# Patient Record
Sex: Female | Born: 1990
Health system: Southern US, Community
[De-identification: ages and names within clinical notes are randomized; demographics above are authoritative.]

## PROBLEM LIST (undated history)

## (undated) ENCOUNTER — Inpatient Hospital Stay: Payer: Self-pay

## (undated) DIAGNOSIS — T7840XA Allergy, unspecified, initial encounter: Secondary | ICD-10-CM

## (undated) DIAGNOSIS — D509 Iron deficiency anemia, unspecified: Secondary | ICD-10-CM

## (undated) DIAGNOSIS — D649 Anemia, unspecified: Secondary | ICD-10-CM

## (undated) DIAGNOSIS — J189 Pneumonia, unspecified organism: Secondary | ICD-10-CM

## (undated) HISTORY — DX: Anemia, unspecified: D64.9

## (undated) HISTORY — PX: ADENOIDECTOMY: SUR15

## (undated) HISTORY — DX: Iron deficiency anemia, unspecified: D50.9

## (undated) HISTORY — DX: Allergy, unspecified, initial encounter: T78.40XA

## (undated) HISTORY — PX: OTHER SURGICAL HISTORY: SHX169

## (undated) HISTORY — DX: Pneumonia, unspecified organism: J18.9

---
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
## 2011-07-17 DIAGNOSIS — R251 Tremor, unspecified: Secondary | ICD-10-CM | POA: Insufficient documentation

## 2011-07-17 DIAGNOSIS — F8081 Childhood onset fluency disorder: Secondary | ICD-10-CM | POA: Insufficient documentation

## 2011-08-27 ENCOUNTER — Encounter: Payer: Self-pay | Admitting: Family Medicine

## 2011-08-27 ENCOUNTER — Ambulatory Visit (INDEPENDENT_AMBULATORY_CARE_PROVIDER_SITE_OTHER): Payer: BC Managed Care – PPO | Admitting: Family Medicine

## 2011-08-27 VITALS — BP 110/80 | HR 92 | Temp 98.5°F | Ht 66.25 in | Wt 189.8 lb

## 2011-08-27 DIAGNOSIS — R251 Tremor, unspecified: Secondary | ICD-10-CM

## 2011-08-27 DIAGNOSIS — J45909 Unspecified asthma, uncomplicated: Secondary | ICD-10-CM

## 2011-08-27 DIAGNOSIS — T7840XA Allergy, unspecified, initial encounter: Secondary | ICD-10-CM | POA: Insufficient documentation

## 2011-08-27 DIAGNOSIS — J4521 Mild intermittent asthma with (acute) exacerbation: Secondary | ICD-10-CM | POA: Insufficient documentation

## 2011-08-27 DIAGNOSIS — R002 Palpitations: Secondary | ICD-10-CM | POA: Insufficient documentation

## 2011-08-27 DIAGNOSIS — R259 Unspecified abnormal involuntary movements: Secondary | ICD-10-CM

## 2011-08-27 DIAGNOSIS — D649 Anemia, unspecified: Secondary | ICD-10-CM | POA: Insufficient documentation

## 2011-08-27 MED ORDER — ALBUTEROL SULFATE HFA 108 (90 BASE) MCG/ACT IN AERS: 2.0000 | INHALATION_SPRAY | Freq: Four times a day (QID) | RESPIRATORY_TRACT | Status: DC | PRN

## 2011-08-27 NOTE — Progress Notes (Signed)
Subjective:    Patient ID: Jacqueline Fields, female    DOB: 13-Jun-1990, 21 y.o.   MRN: [id number]  HPI 21 yo here to establish care. She comes with her mother today, has had a quite extensive history since 2007.  Mom brings notes and records with her.  Palpitations- started in 2007.  Per pt and her mother, had an extensive cardiac work up including echo and holter monitor, both unremarkable.  Stopped playing soccer because of these palpitations.  Severe diarrhea and abdominal pain- 2012- neg colonoscopy,. Neg stool studies at [facility]  Abd U/s, HIDA scan neg. Told she had IBS.  Those symptoms have resolved.  Tremors-  Started in February 2013- very , come and go, can "feel my whole body shake."  Had some word finding difficulty which did resolve and has not recurred.   Advised to go to ER.  Went to [facility] ER-   UA pos for UTI- given bactrim and IVFs for dehydration.   Follow up att with neuro at [facility]- ordered additional labs and head CT- neg.  Today, only symptoms she still has is the intermittent tremors. Denies any anxiety or depression.  Asthma- has seasonal allergy and exercised induced asthma. Ran out of her albuterol inhaler. Takes singular daily.  Patient Active Problem List  Diagnoses  . Tremors of nervous system  . Anemia  . Allergy  . Heart palpitations   Past Medical History  Diagnosis Date  . Anemia   . Allergy    Past Surgical History  Procedure Date  . Adenoidectomy    History  Substance Use Topics  . Smoking status: Never Smoker   . Smokeless tobacco: Not on file  . Alcohol Use: Not on file   No family history on file. No Known Allergies Current Outpatient Prescriptions on File Prior to Visit  Medication Sig Dispense Refill  . montelukast (SINGULAIR) 10 MG tablet Take 10 mg by mouth at bedtime.      Marland Kitchen NORGESTREL-ETHINYL ESTRADIOL PO Take by mouth. Takes 0.25-.035 mg daily      . albuterol (PROVENTIL HFA;VENTOLIN HFA) 108 (90 BASE) MCG/ACT inhaler Inhale 2  puffs into the lungs every 6 (six) hours as needed for wheezing.  1 Inhaler  0   The PMH, PSH, Social History, Family History, Medications, and allergies have been reviewed in [facility], and have been updated if relevant.    Review of Systems See HPI  Patient reports no  vision/ hearing changes,anorexia, weight change, fever ,adenopathy, persistant / recurrent hoarseness, swallowing issues, chest pain, edema,persistant / recurrent cough, hemoptysis, syncope, focal weakness, severe memory loss, concerning skin lesions, depression, anxiety, abnormal bruising/bleeding, major joint swelling, breast masses or abnormal vaginal bleeding.       Objective:   Physical Exam BP 110/80  Pulse 92  Temp(Src) 98.5 F (36.9 C) (Oral)  Ht 5' 6.25" (1.683 m)  Wt 189 lb 12 oz (86.07 kg)  BMI 30.40 kg/m2  LMP 08/26/2011  General:  Well-developed,well-nourished,in no acute distress; alert,appropriate and cooperative throughout examination Head:  normocephalic and atraumatic.   Eyes:  vision grossly intact, pupils equal, pupils round, and pupils reactive to light.   Ears:  R ear normal and L ear normal.   Nose:  no external deformity.   Mouth:  good dentition.   Neck:  No deformities, masses, or tenderness noted. Lungs:  Normal respiratory effort, chest expands symmetrically.  Pos scattered exp wheezes Heart:  Normal rate and regular rhythm. S1 and S2 normal without gallop, murmur, click,  rub or other extra sounds. Abdomen:  Bowel sounds positive,abdomen soft and non-tender without masses, organomegaly or hernias noted. Msk:  No deformity or scoliosis noted of thoracic or lumbar spine.   Extremities:  No clubbing, cyanosis, edema, or deformity noted with normal full range of motion of all joints.   Neurologic:  alert & oriented X3 and gait normal.   Skin:  Intact without suspicious lesions or rashes Psych:  Cognition and judgment appear intact. Alert and cooperative with normal attention span and  concentration. No apparent delusions, illusions, hallucinations      Assessment & Plan:   1. Tremors of nervous system  Wide differential with initial negative work up. Pt would like a second opinion from another neurologist which I feel is reasonable. ?possible EEG vs MRI. Referral placed. Ambulatory referral to Neurology  2. Heart palpitations  Resolved- awaiting records to review work up.   3. Asthma  Deteriorated.  Non toxic today. Refilled her inhaler.

## 2011-08-27 NOTE — Patient Instructions (Signed)
Please stop by to see Jacqueline Fields on your way out to set up your neurology referral. It was so nice to meet you.

## 2011-12-24 ENCOUNTER — Other Ambulatory Visit: Payer: Self-pay | Admitting: Family Medicine

## 2011-12-24 NOTE — Telephone Encounter (Signed)
Ok to refill?  Was seen in may for new patient appt, has no upcoming appts scheduled.

## 2012-02-01 ENCOUNTER — Ambulatory Visit (INDEPENDENT_AMBULATORY_CARE_PROVIDER_SITE_OTHER): Payer: BC Managed Care – PPO | Admitting: Family Medicine

## 2012-02-01 ENCOUNTER — Ambulatory Visit: Payer: Self-pay | Admitting: Family Medicine

## 2012-02-01 ENCOUNTER — Encounter: Payer: Self-pay | Admitting: Family Medicine

## 2012-02-01 VITALS — BP 120/82 | HR 72 | Temp 97.7°F | Wt 195.0 lb

## 2012-02-01 DIAGNOSIS — N644 Mastodynia: Secondary | ICD-10-CM

## 2012-02-01 DIAGNOSIS — Z23 Encounter for immunization: Secondary | ICD-10-CM

## 2012-02-01 NOTE — Patient Instructions (Addendum)
Good to see you. Please stop by to see Shirlee Limerick on your way out to set up your breast ultrasoudn.

## 2012-02-01 NOTE — Progress Notes (Addendum)
  Subjective:    Patient ID: Jacqueline Fields, female    DOB: [birth year], 21 y.o.   MRN: [id number]  HPI  21 yo here for 1 month of bilateral breast pain.  No masses identified on self breast exam.  No changes or drainage from her nipples. No family h/o breast cancer.  Pain is not worse with menstruation.  She has not changed her OCPs recently.  Denies being sexually active currently and states "there is no way I'm pregnant." Patient Active Problem List  Diagnosis  . Tremors of nervous system  . Anemia  . Allergy  . Heart palpitations  . Asthma  . Breast pain in female   Past Medical History  Diagnosis Date  . Anemia   . Allergy    Past Surgical History  Procedure Date  . Adenoidectomy    History  Substance Use Topics  . Smoking status: Never Smoker   . Smokeless tobacco: Not on file  . Alcohol Use: Not on file   No family history on file. No Known Allergies Current Outpatient Prescriptions on File Prior to Visit  Medication Sig Dispense Refill  . albuterol (PROVENTIL HFA;VENTOLIN HFA) 108 (90 BASE) MCG/ACT inhaler Inhale 2 puffs into the lungs every 6 (six) hours as needed for wheezing.  1 Inhaler  0  . montelukast (SINGULAIR) 10 MG tablet Take 10 mg by mouth at bedtime.      . norgestimate-ethinyl estradiol (ORTHO-CYCLEN,SPRINTEC,PREVIFEM) 0.25-35 MG-MCG tablet take 1 tablet by mouth once daily  28 tablet  11   The PMH, PSH, Social History, Family History, Medications, and allergies have been reviewed in [facility], and have been updated if relevant.   Review of Systems See HPI    Objective:   Physical Exam BP 120/82  Pulse 72  Temp 97.7 F (36.5 C)  Wt 195 lb (88.451 kg)  General:  Well-developed,well-nourished,in no acute distress; alert,appropriate and cooperative throughout examination Head:  normocephalic and atraumatic.   Neck:  No deformities, masses, or tenderness noted. Breasts:  No mass, nodules, thickening, tenderness, bulging, retraction,  inflamation, nipple discharge or skin changes noted.   Neurologic:  alert & oriented X3 and gait normal.   Skin:  Intact without suspicious lesions or rashes Axillary Nodes:  No palpable lymphadenopathy Psych:  Cognition and judgment appear intact. Alert and cooperative with normal attention span and concentration. No apparent delusions, illusions, hallucinations      Assessment & Plan:   1. Breast pain in female  US Breast Bilateral   New without masses identified on exam. Will refer for breast ultrasound for further evaluation as she does have dense breasts. The patient indicates understanding of these issues and agrees with the plan.

## 2012-02-01 NOTE — Addendum Note (Signed)
Addended by: Eliezer Bottom on: 02/01/2012 05:06 PM   Modules accepted: Orders

## 2012-02-02 ENCOUNTER — Encounter: Payer: Self-pay | Admitting: Family Medicine

## 2012-06-13 ENCOUNTER — Ambulatory Visit: Payer: BC Managed Care – PPO | Admitting: Family Medicine

## 2012-06-15 ENCOUNTER — Ambulatory Visit: Payer: BC Managed Care – PPO | Admitting: Family Medicine

## 2012-06-20 ENCOUNTER — Ambulatory Visit (INDEPENDENT_AMBULATORY_CARE_PROVIDER_SITE_OTHER): Payer: BC Managed Care – PPO | Admitting: Family Medicine

## 2012-06-20 ENCOUNTER — Encounter: Payer: Self-pay | Admitting: Family Medicine

## 2012-06-20 VITALS — BP 110/54 | HR 87 | Temp 97.9°F | Wt 192.0 lb

## 2012-06-20 DIAGNOSIS — B360 Pityriasis versicolor: Secondary | ICD-10-CM | POA: Insufficient documentation

## 2012-06-20 MED ORDER — KETOCONAZOLE 2 % EX SHAM: MEDICATED_SHAMPOO | CUTANEOUS | Status: DC

## 2012-06-20 NOTE — Patient Instructions (Addendum)
Tinea Versicolor  Tinea versicolor is a common yeast infection of the skin. This condition becomes known when the yeast on our skin starts to overgrow (yeast is a normal inhabitant on our skin). This condition is noticed as white or light brown patches on brown skin, and is more evident in the summer on tanned skin. These areas are slightly scaly if scratched. The light patches from the yeast become evident when the yeast creates "holes in your suntan". This is most often noticed in the summer. The patches are usually located on the chest, back, pubis, neck and body folds. However, it may occur on any area of body. Mild itching and inflammation (redness or soreness) may be present.  DIAGNOSIS   The diagnosisof this is made clinically (by looking). Cultures from samples are usually not needed. Examination under the microscope may help. However, yeast is normally found on skin. The diagnosis still remains clinical. Examination under Wood's Ultraviolet Light can determine the extent of the infection.  TREATMENT   This common infection is usually only of cosmetic (only a concern to your appearance). It is easily treated with dandruff shampoo used during showers or bathing. Vigorous scrubbing will eliminate the yeast over several days time. The light areas in your skin may remain for weeks or months after the infection is cured unless your skin is exposed to sunlight. The lighter or darker spots caused by the fungus that remain after complete treatment are not a sign of treatment failure; it will take a long time to resolve. Your caregiver may recommend a number of commercial preparations or medication by mouth if home care is not working. Recurrence is common and preventative medication may be necessary.  This skin condition is not highly contagious. Special care is not needed to protect close friends and family members. Normal hygiene is usually enough. Follow up is required only if you develop complications (such as a  secondary infection from scratching), if recommended by your caregiver, or if no relief is obtained from the preparations used.  Document Released: 03/27/2000 Document Revised: 06/22/2011 Document Reviewed: 05/09/2008  ExitCare Patient Information 2013 [employer].

## 2012-06-20 NOTE — Progress Notes (Signed)
  Subjective:    Patient ID: Jacqueline Fields, female    DOB: 02-18-1991, 21 y.o.   MRN: [id number]  HPI  Very pleasant 22 yo female here for rash that is spreading "all over" for past month.  No changes in detergents or soaps.  No recent travel.  No household contacts, including her boyfriend, have this rash. Benadryl is not helping at all. It is itchy.  Seems to be spreading.  Started on hands, arms on back of neck and breasts as well.  Patient Active Problem List  Diagnosis  . Tremors of nervous system  . Anemia  . Allergy  . Heart palpitations  . Asthma  . Breast pain in female   Past Medical History  Diagnosis Date  . Anemia   . Allergy    Past Surgical History  Procedure Laterality Date  . Adenoidectomy     History  Substance Use Topics  . Smoking status: Never Smoker   . Smokeless tobacco: Not on file  . Alcohol Use: Not on file   No family history on file. No Known Allergies Current Outpatient Prescriptions on File Prior to Visit  Medication Sig Dispense Refill  . albuterol (PROVENTIL HFA;VENTOLIN HFA) 108 (90 BASE) MCG/ACT inhaler Inhale 2 puffs into the lungs every 6 (six) hours as needed for wheezing.  1 Inhaler  0  . montelukast (SINGULAIR) 10 MG tablet Take 10 mg by mouth at bedtime.      . norgestimate-ethinyl estradiol (ORTHO-CYCLEN,SPRINTEC,PREVIFEM) 0.25-35 MG-MCG tablet take 1 tablet by mouth once daily  28 tablet  11   No current facility-administered medications on file prior to visit.   The PMH, PSH, Social History, Family History, Medications, and allergies have been reviewed in [facility], and have been updated if relevant.   Review of Systems See HPI No fevers No SOB    Objective:   Physical Exam BP 110/54  Pulse 87  Temp(Src) 97.9 F (36.6 C)  Wt 192 lb (87.091 kg)  BMI 30.75 kg/m2 Gen:  Alert, pleasant, NAD Resp:  CTA bilaterally Skin:  hyperpigmented and mildly erythematous macules.       Assessment & Plan:  1. Tinea versicolor New.   Discussed tx and course of tinea versicolor-see AVS. Treat with ketoconazole shampoo- rx sent to pharmacy. Call or return to clinic prn if these symptoms worsen or fail to improve as anticipated.

## 2012-08-10 ENCOUNTER — Encounter: Payer: BC Managed Care – PPO | Admitting: Family Medicine

## 2012-08-10 DIAGNOSIS — Z0289 Encounter for other administrative examinations: Secondary | ICD-10-CM

## 2012-09-27 ENCOUNTER — Ambulatory Visit: Payer: Self-pay | Admitting: Otolaryngology

## 2012-10-11 ENCOUNTER — Other Ambulatory Visit (HOSPITAL_COMMUNITY)
Admission: RE | Admit: 2012-10-11 | Discharge: 2012-10-11 | Disposition: A | Discharge status: Home / Self Care | Payer: BC Managed Care – PPO | Source: Ambulatory Visit | Attending: Family Medicine | Admitting: Family Medicine

## 2012-10-11 ENCOUNTER — Encounter: Payer: Self-pay | Admitting: Family Medicine

## 2012-10-11 ENCOUNTER — Ambulatory Visit (INDEPENDENT_AMBULATORY_CARE_PROVIDER_SITE_OTHER): Payer: BC Managed Care – PPO | Admitting: Family Medicine

## 2012-10-11 VITALS — BP 100/60 | HR 76 | Temp 98.3°F | Ht 66.5 in | Wt 195.0 lb

## 2012-10-11 DIAGNOSIS — Z01419 Encounter for gynecological examination (general) (routine) without abnormal findings: Secondary | ICD-10-CM | POA: Insufficient documentation

## 2012-10-11 DIAGNOSIS — Z113 Encounter for screening for infections with a predominantly sexual mode of transmission: Secondary | ICD-10-CM | POA: Insufficient documentation

## 2012-10-11 DIAGNOSIS — Z0000 Encounter for general adult medical examination without abnormal findings: Secondary | ICD-10-CM | POA: Insufficient documentation

## 2012-10-11 NOTE — Patient Instructions (Addendum)
Great to see you. We will call you with your pap smear results and lab results.  Take care.  Preventive Care for Adults, Female A healthy lifestyle and preventive care can promote health and wellness. Preventive health guidelines for women include the following key practices.  A routine yearly physical is a good way to check with your caregiver about your health and preventive screening. It is a chance to share any concerns and updates on your health, and to receive a thorough exam.  Visit your dentist for a routine exam and preventive care every 6 months. Brush your teeth twice a day and floss once a day. Good oral hygiene prevents tooth decay and gum disease.  The frequency of eye exams is based on your age, health, family medical history, use of contact lenses, and other factors. Follow your caregiver's recommendations for frequency of eye exams.  Eat a healthy diet. Foods like vegetables, fruits, whole grains, low-fat dairy products, and lean protein foods contain the nutrients you need without too many calories. Decrease your intake of foods high in solid fats, added sugars, and salt. Eat the right amount of calories for you.Get information about a proper diet from your caregiver, if necessary.  Regular physical exercise is one of the most important things you can do for your health. Most adults should get at least 150 minutes of moderate-intensity exercise (any activity that increases your heart rate and causes you to sweat) each week. In addition, most adults need muscle-strengthening exercises on 2 or more days a week.  Maintain a healthy weight. The body mass index (BMI) is a screening tool to identify possible weight problems. It provides an estimate of body fat based on height and weight. Your caregiver can help determine your BMI, and can help you achieve or maintain a healthy weight.For adults 20 years and older:  A BMI below 18.5 is considered underweight.  A BMI of 18.5 to 24.9  is normal.  A BMI of 25 to 29.9 is considered overweight.  A BMI of 30 and above is considered obese.  Maintain normal blood lipids and cholesterol levels by exercising and minimizing your intake of saturated fat. Eat a balanced diet with plenty of fruit and vegetables. Blood tests for lipids and cholesterol should begin at age 76 and be repeated every 5 years. If your lipid or cholesterol levels are high, you are over 50, or you are at high risk for heart disease, you may need your cholesterol levels checked more frequently.Ongoing high lipid and cholesterol levels should be treated with medicines if diet and exercise are not effective.  If you smoke, find out from your caregiver how to quit. If you do not use tobacco, do not start.  If you are pregnant, do not drink alcohol. If you are breastfeeding, be very cautious about drinking alcohol. If you are not pregnant and choose to drink alcohol, do not exceed 1 drink per day. One drink is considered to be 12 ounces (355 mL) of beer, 5 ounces (148 mL) of wine, or 1.5 ounces (44 mL) of liquor.  Avoid use of street drugs. Do not share needles with anyone. Ask for help if you need support or instructions about stopping the use of drugs.  High blood pressure causes heart disease and increases the risk of stroke. Your blood pressure should be checked at least every 1 to 2 years. Ongoing high blood pressure should be treated with medicines if weight loss and exercise are not effective.  If  you are 70 to 21 years old, ask your caregiver if you should take aspirin to prevent strokes.  Diabetes screening involves taking a blood sample to check your fasting blood sugar level. This should be done once every 3 years, after age 60, if you are within normal weight and without risk factors for diabetes. Testing should be considered at a younger age or be carried out more frequently if you are overweight and have at least 1 risk factor for diabetes.  Breast  cancer screening is essential preventive care for women. You should practice "breast self-awareness." This means understanding the normal appearance and feel of your breasts and may include breast self-examination. Any changes detected, no matter how small, should be reported to a caregiver. Women in their 74s and 30s should have a clinical breast exam (CBE) by a caregiver as part of a regular health exam every 1 to 3 years. After age 85, women should have a CBE every year. Starting at age 69, women should consider having a mammography (breast X-ray test) every year. Women who have a family history of breast cancer should talk to their caregiver about genetic screening. Women at a high risk of breast cancer should talk to their caregivers about having magnetic resonance imaging (MRI) and a mammography every year.  The Pap test is a screening test for cervical cancer. A Pap test can show cell changes on the cervix that might become cervical cancer if left untreated. A Pap test is a procedure in which cells are obtained and examined from the lower end of the uterus (cervix).  Women should have a Pap test starting at age 62.  Between ages 13 and 13, Pap tests should be repeated every 2 years.  Beginning at age 33, you should have a Pap test every 3 years as long as the past 3 Pap tests have been normal.  Some women have medical problems that increase the chance of getting cervical cancer. Talk to your caregiver about these problems. It is especially important to talk to your caregiver if a new problem develops soon after your last Pap test. In these cases, your caregiver may recommend more frequent screening and Pap tests.  The above recommendations are the same for women who have or have not gotten the vaccine for human papillomavirus (HPV).  If you had a hysterectomy for a problem that was not cancer or a condition that could lead to cancer, then you no longer need Pap tests. Even if you no longer need  a Pap test, a regular exam is a good idea to make sure no other problems are starting.  If you are between ages 60 and 21, and you have had normal Pap tests going back 10 years, you no longer need Pap tests. Even if you no longer need a Pap test, a regular exam is a good idea to make sure no other problems are starting.  If you have had past treatment for cervical cancer or a condition that could lead to cancer, you need Pap tests and screening for cancer for at least 20 years after your treatment.  If Pap tests have been discontinued, risk factors (such as a new sexual partner) need to be reassessed to determine if screening should be resumed.  The HPV test is an additional test that may be used for cervical cancer screening. The HPV test looks for the virus that can cause the cell changes on the cervix. The cells collected during the Pap test can  be tested for HPV. The HPV test could be used to screen women aged 86 years and older, and should be used in women of any age who have unclear Pap test results. After the age of 71, women should have HPV testing at the same frequency as a Pap test.  HPV. You need this vaccine if you are a woman age 68 or younger. The vaccine is given in 3 doses over 6 months.

## 2012-10-11 NOTE — Progress Notes (Signed)
Subjective:    Patient ID: Jacqueline Fields, female    DOB: Mar 15, 1991, 22 y.o.   MRN: [id number]  HPI 64 G1 yo here for gyn exam.  Has never had a pap smear. She is sexually active with one partner. On OCPs- not having any mid cycle spotting or cramping.  No vaginal discharge or dysuria.  Has received entire gardasil series per pt.  Patient Active Problem List   Diagnosis Date Noted  . Routine general medical examination at a health care facility 10/11/2012  . Encounter for routine gynecological examination 10/11/2012  . Tremors of nervous system 08/27/2011  . Heart palpitations 08/27/2011  . Asthma 08/27/2011  . Anemia   . Allergy    Past Medical History  Diagnosis Date  . Anemia   . Allergy    Past Surgical History  Procedure Laterality Date  . Adenoidectomy     History  Substance Use Topics  . Smoking status: Never Smoker   . Smokeless tobacco: Not on file  . Alcohol Use: Not on file   No family history on file. No Known Allergies Current Outpatient Prescriptions on File Prior to Visit  Medication Sig Dispense Refill  . albuterol (PROVENTIL HFA;VENTOLIN HFA) 108 (90 BASE) MCG/ACT inhaler Inhale 2 puffs into the lungs every 6 (six) hours as needed for wheezing.  1 Inhaler  0  . montelukast (SINGULAIR) 10 MG tablet Take 10 mg by mouth at bedtime.      . norgestimate-ethinyl estradiol (ORTHO-CYCLEN,SPRINTEC,PREVIFEM) 0.25-35 MG-MCG tablet take 1 tablet by mouth once daily  28 tablet  11   No current facility-administered medications on file prior to visit.   The PMH, PSH, Social History, Family History, Medications, and allergies have been reviewed in [facility], and have been updated if relevant.     Review of Systems    See HPI Patient reports no  vision/ hearing changes,anorexia, weight change, fever ,adenopathy, persistant / recurrent hoarseness, swallowing issues, chest pain, edema,persistant / recurrent cough, hemoptysis, dyspnea(rest, exertional, paroxysmal  nocturnal), gastrointestinal  bleeding (melena, rectal bleeding), abdominal pain, excessive heart burn, GU symptoms(dysuria, hematuria, pyuria, voiding/incontinence  Issues) syncope, focal weakness, severe memory loss, concerning skin lesions, depression, anxiety, abnormal bruising/bleeding, major joint swelling, breast masses or abnormal vaginal bleeding.    Objective:   Physical Exam BP 100/60  Pulse 76  Temp(Src) 98.3 F (36.8 C)  Ht 5' 6.5" (1.689 m)  Wt 195 lb (88.451 kg)  BMI 31.01 kg/m2  General:  Well-developed,well-nourished,in no acute distress; alert,appropriate and cooperative throughout examination Abdomen:  Bowel sounds positive,abdomen soft and non-tender without masses, organomegaly or hernias noted. Rectal:  no external abnormalities.   Genitalia:  Pelvic Exam:        External: normal female genitalia without lesions or masses        Vagina: normal without lesions or masses        Cervix: normal without lesions or masses        Adnexa: normal bimanual exam without masses or fullness        Uterus: normal by palpation        Pap smear: performed Msk:  No deformity or scoliosis noted of thoracic or lumbar spine.   Extremities:  No clubbing, cyanosis, edema, or deformity noted with normal full range of motion of all joints.   Neurologic:  alert & oriented X3 and gait normal.   Skin:  Intact without suspicious lesions or rashes Cervical Nodes:  No lymphadenopathy noted Axillary Nodes:  No palpable lymphadenopathy Psych:  Cognition and judgment appear intact. Alert and cooperative with normal attention span and concentration. No apparent delusions, illusions, hallucinations      Assessment & Plan:   1. Encounter for routine gynecological examination Discussed dangers of smoking, alcohol, and drug abuse.  Also discussed sexual activity, pregnancy risk, and STD risk.  Encouraged to get regular exercise. Pap smear   2. Screening for STD (sexually transmitted  disease)  - RPR - HIV Antibody -GC/Chlamydia - Cytology - PAP

## 2012-10-13 ENCOUNTER — Encounter: Payer: Self-pay | Admitting: Family Medicine

## 2012-11-30 ENCOUNTER — Encounter: Payer: Self-pay | Admitting: Family Medicine

## 2012-11-30 ENCOUNTER — Telehealth: Payer: Self-pay

## 2012-11-30 ENCOUNTER — Ambulatory Visit (INDEPENDENT_AMBULATORY_CARE_PROVIDER_SITE_OTHER): Payer: BC Managed Care – PPO | Admitting: Family Medicine

## 2012-11-30 ENCOUNTER — Telehealth: Payer: Self-pay | Admitting: Family Medicine

## 2012-11-30 VITALS — BP 106/68 | HR 78 | Temp 98.6°F | Ht 66.25 in | Wt 196.8 lb

## 2012-11-30 DIAGNOSIS — N926 Irregular menstruation, unspecified: Secondary | ICD-10-CM

## 2012-11-30 DIAGNOSIS — N946 Dysmenorrhea, unspecified: Secondary | ICD-10-CM | POA: Insufficient documentation

## 2012-11-30 MED ORDER — NAPROXEN 500 MG PO TABS: 500.0000 mg | ORAL_TABLET | Freq: Two times a day (BID) | ORAL | Status: DC

## 2012-11-30 NOTE — Telephone Encounter (Signed)
Pt was seen

## 2012-11-30 NOTE — Progress Notes (Signed)
Subjective:    Patient ID: Jacqueline Fields, female    DOB: Mar 12, 1991, 22 y.o.   MRN: [id number]  HPI Here with a period for 2 weeks  Started very light and then became heavier  (using panty liner)  Today is the day she is supposed to start her period - and it has not changed yet  Now she has had more cramping lately   Usually starts placebo pill on Sunday and then menses on tues or wed Has not missed any pills  On the same generic she has been on for years   Is sexually active without condoms   Has been sleeping more lately-more tired than normal  Not really stressed  No change in exercise Has gained 4 lb in 2 months    No new medicines or herbs Does not live around other women in different cycles   Patient Active Problem List   Diagnosis Date Noted  . Routine general medical examination at a health care facility 10/11/2012  . Encounter for routine gynecological examination 10/11/2012  . Tremors of nervous system 08/27/2011  . Heart palpitations 08/27/2011  . Asthma 08/27/2011  . Anemia   . Allergy    Past Medical History  Diagnosis Date  . Anemia   . Allergy    Past Surgical History  Procedure Laterality Date  . Adenoidectomy     History  Substance Use Topics  . Smoking status: Never Smoker   . Smokeless tobacco: Not on file  . Alcohol Use: No   No family history on file. No Known Allergies Current Outpatient Prescriptions on File Prior to Visit  Medication Sig Dispense Refill  . albuterol (PROVENTIL HFA;VENTOLIN HFA) 108 (90 BASE) MCG/ACT inhaler Inhale 2 puffs into the lungs every 6 (six) hours as needed for wheezing.  1 Inhaler  0  . norgestimate-ethinyl estradiol (ORTHO-CYCLEN,SPRINTEC,PREVIFEM) 0.25-35 MG-MCG tablet take 1 tablet by mouth once daily  28 tablet  11   No current facility-administered medications on file prior to visit.      Review of Systems Review of Systems  Constitutional: Negative for fever, appetite change, fatigue and unexpected  weight change.  Eyes: Negative for pain and visual disturbance.  Respiratory: Negative for cough and shortness of breath.   Cardiovascular: Negative for cp or palpitations    Gastrointestinal: Negative for nausea, diarrhea and constipation.  Genitourinary: Negative for urgency and frequency. pos for vag bleeding/ neg for d/c or odor or irritation/ pos for menstrual cramps Skin: Negative for pallor or rash   Neurological: Negative for weakness, light-headedness, numbness and headaches.  Hematological: Negative for adenopathy. Does not bruise/bleed easily.  Psychiatric/Behavioral: Negative for dysphoric mood. The patient is not nervous/anxious.         Objective:   Physical Exam  Constitutional: She appears well-developed and well-nourished. No distress.  obese and well appearing   HENT:  Head: Normocephalic and atraumatic.  Eyes: Conjunctivae and EOM are normal. Pupils are equal, round, and reactive to light. No scleral icterus.  Neck: Normal range of motion. Neck supple. No thyromegaly present.  Cardiovascular: Normal rate.   Pulmonary/Chest: Effort normal and breath sounds normal.  Abdominal: Soft. Bowel sounds are normal. She exhibits no distension and no mass. There is tenderness. There is no rebound and no guarding.  Mild suprapubic tenderness   Genitourinary:  Light vaginal bleeding- from cervical OS No abn on exam- no polyps or other sources of bleeding No M on bimanual exam/ no enl of uterus or adnexa Some discomfort  with palp of uterus  Musculoskeletal: Normal range of motion.  Neurological: She is alert. She has normal reflexes.  Skin: Skin is warm and dry. No pallor.  Psychiatric: She has a normal mood and affect.          Assessment & Plan:

## 2012-11-30 NOTE — Telephone Encounter (Signed)
Jacqueline Fields said pt started with vaginal bleeding (not heavy flow) with lower abd pain on 11/19/12. Ibuprofen helps with pain but does not completely elevate pain. Today pt has constant pain (pain level now is 4) and still light vaginal bleeding but no clotting. Pt has not missed any BC pills and no recent changes to Jacqueline Fields pills.Pt is sexually active. Pt should start pill pack today. Fields said disposition is ED but pt does not want to go to ED. Dr Jacqueline Fields has no available appts and pt does not want to see female physician; pt scheduled to see Dr Jacqueline Fields today at 11:15 am with instructions if pt's condition changes or worsens prior to appt pt to go to ED. Jacqueline Fields will advise pt.

## 2012-11-30 NOTE — Patient Instructions (Addendum)
Continue with this pill pack and start the next one  If bleeding continues beyond 1-2 weeks of the next pill pack please call and let us know  Exam is reassuring today Try the naproxen with meal up to twice daily as needed for pain/ cramps  Avoid caffeine

## 2012-11-30 NOTE — Telephone Encounter (Signed)
I will see her then  

## 2012-11-30 NOTE — Telephone Encounter (Signed)
Patient Information:  Caller Name: Jacqueline Fields  Phone: [phone]  Patient: Jacqueline Fields, Jacqueline Fields  Gender: Female  DOB: 1990-06-18  Age: 22 Years  PCP: [facility])  Pregnant: No  Office Follow Up:  Does the office need to follow up with this patient?: No  Instructions For The Office: N/A   Symptoms  Reason For Call & Symptoms: Today, 11/30/2012, pt transferred to nurse. - Pt  has been taking BCP since she was 22  yo.  She is  supposed to start her period, today,  but  has had light  bleeding since   08/09 /2014 . She denies missing pills. Has had adominal pain since bleeding started  ~ 11/19/2012  and has been constant for ~ 2 hours this morning ,   and rating 4/10 during call.  Reviewed Health History In EMR: Yes  Reviewed Medications In EMR: Yes  Reviewed Allergies In EMR: Yes  Reviewed Surgeries / Procedures: Yes  Date of Onset of Symptoms: 11/19/2012  Treatments Tried: Ibuprofen  Treatments Tried Worked: Yes OB / GYN:  LMP: 11/08/2012  Guideline(s) Used:  Vaginal Bleeding - Abnormal  Disposition Per Guideline:   Go to ED Now (or to Office with PCP Approval)  Reason For Disposition Reached:   Constant abdominal pain lasting > 2 hours  Advice Given:  Call Back If:  Bleeding becomes worse  You become worse.  Patient Refused Recommendation: RN advised to proceed to [facility] ED now , but pt declined preferring to be seen in the office. RN spoke to Rena/ LPN and offered 0945  with Dr. Patsy Lager , but pt declined and wants to see female .Appt scheduled with Dr. Milinda Antis for 1115  and advised pt if bleeding becomes heavy or worsening pain before OV  proceed to the ED and pt agreed.

## 2012-12-01 NOTE — Assessment & Plan Note (Signed)
With breakthrough bleeding  Naproxen px given-inst to take with food

## 2012-12-01 NOTE — Assessment & Plan Note (Signed)
Breakthrough bleeding for the first time on her usual OC (no changes)  Some cramping  Now in placebo pills Reassuring exam  Unsure what triggered this  Will have her start her next pack and see what happens-if no imp consider change OC or [place]

## 2013-01-04 ENCOUNTER — Emergency Department: Payer: Self-pay

## 2013-01-04 LAB — COMPREHENSIVE METABOLIC PANEL
Alkaline Phosphatase: 82 U/L (ref 50–136)
Anion Gap: 6 — ABNORMAL LOW (ref 7–16)
BUN: 7 mg/dL (ref 7–18)
Chloride: 104 mmol/L (ref 98–107)
EGFR (African American): >60
Osmolality: 269 (ref 275–301)
Potassium: 3.7 mmol/L (ref 3.5–5.1)
SGOT(AST): 19 U/L (ref 15–37)
SGPT (ALT): 17 U/L (ref 12–78)
Sodium: 135 mmol/L — ABNORMAL LOW (ref 136–145)
Total Protein: 7.6 g/dL (ref 6.4–8.2)

## 2013-01-04 LAB — CBC
HCT: 37.5 % (ref 35.0–47.0)
HGB: 12.7 g/dL (ref 12.0–16.0)
Platelet: 438 10*3/uL (ref 150–440)
WBC: 11.4 10*3/uL — ABNORMAL HIGH (ref 3.6–11.0)

## 2013-02-07 IMAGING — US US BREAST BILAT
1 series · 14 of 21 positions shown · non-contrast
Comparison: none

REASON FOR EXAM: BILATERAL BR PAIN 6 OCLOCK
COMMENTS:

PROCEDURE:     US  - US BREAST BILATERAL  - February 01, 2012  [DATE]
RESULT:

[Series 1: us breast bilat · 0.09mm/px · 14 of 21 slices shown]
[im 1/21]
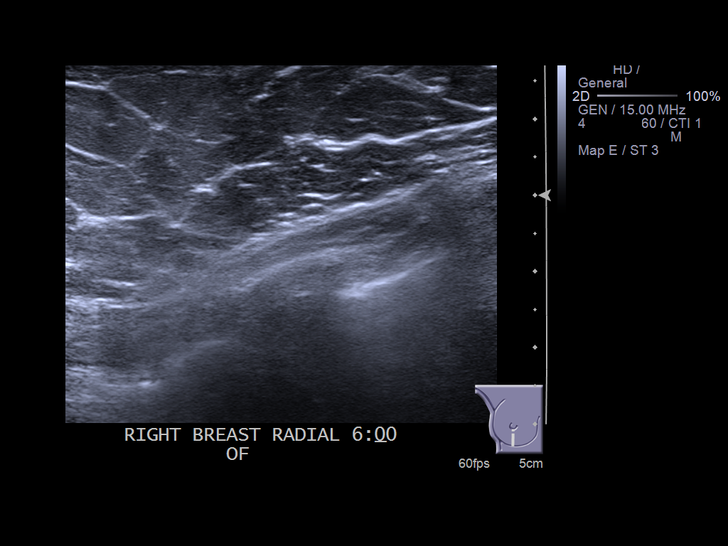
[im 3/21]
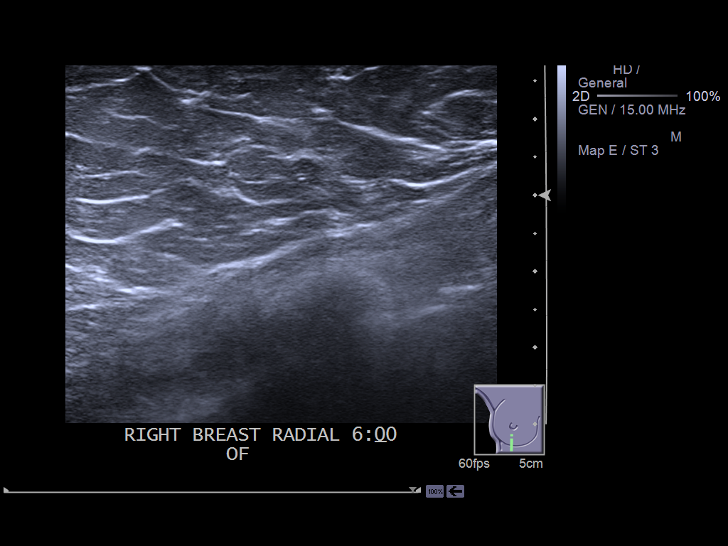
[im 4/21]
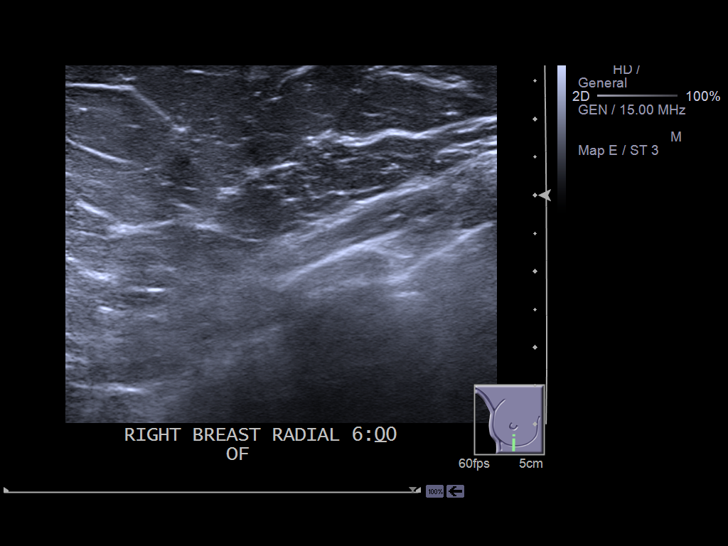
[im 6/21]
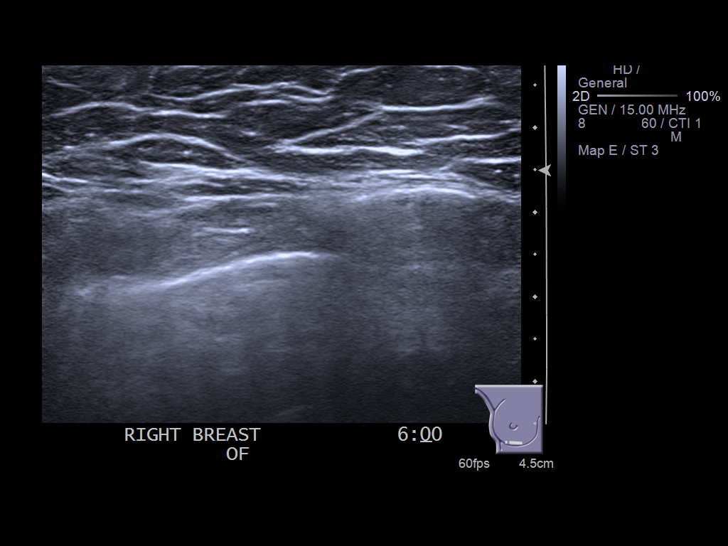
[im 7/21]
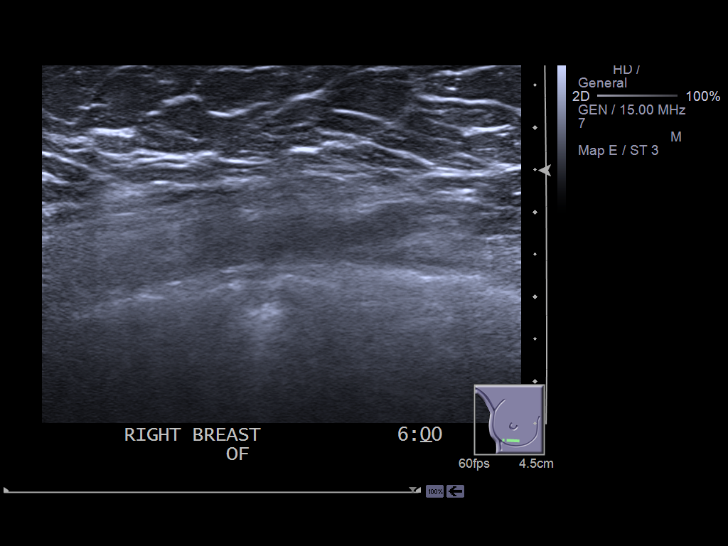
[im 9/21]
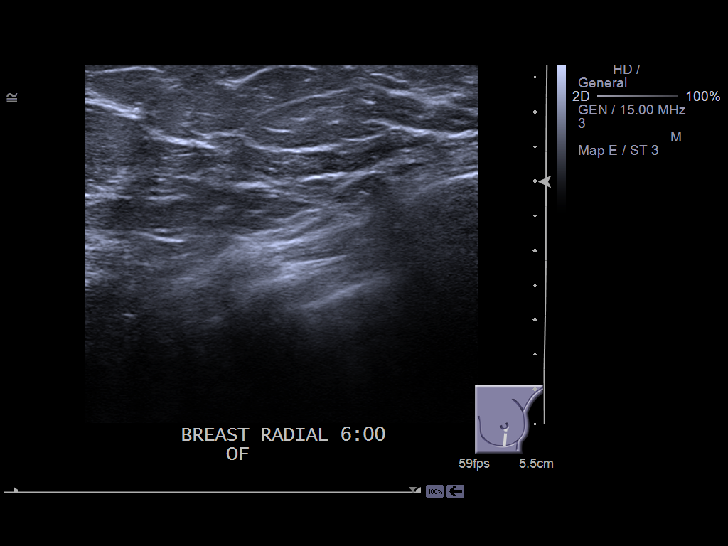
[im 10/21]
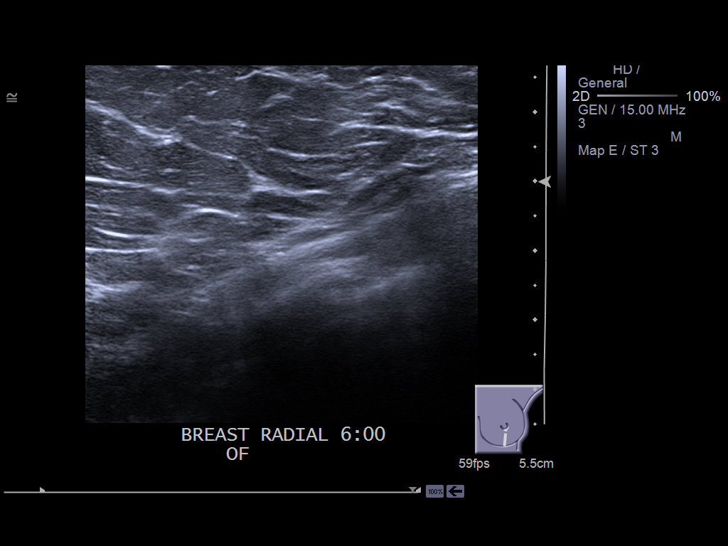
[im 12/21]
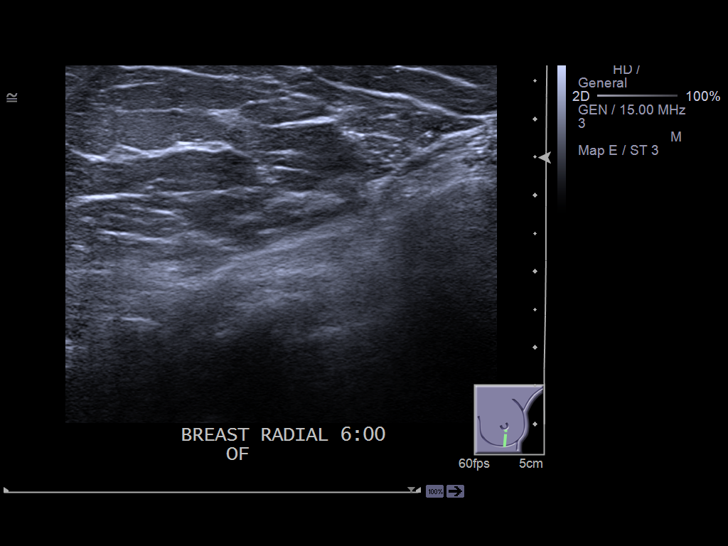
[im 13/21]
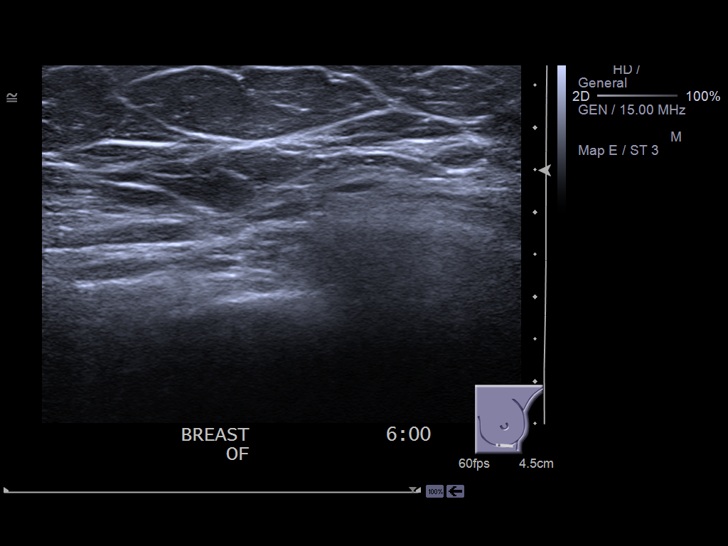
[im 15/21]
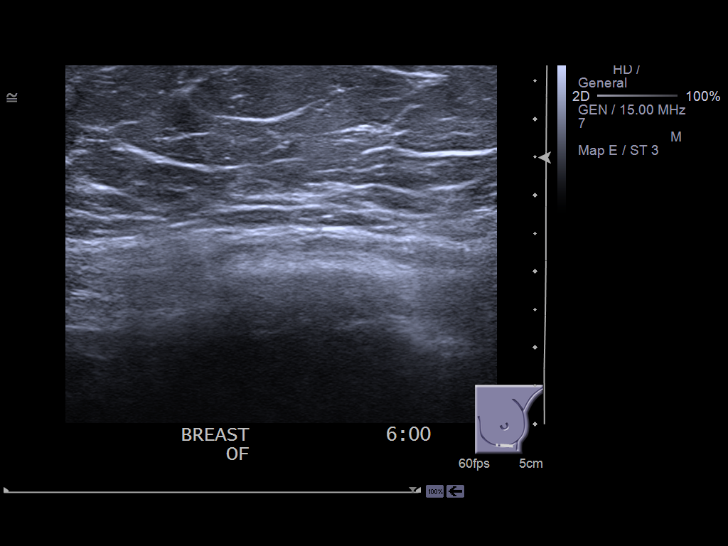
[im 16/21]
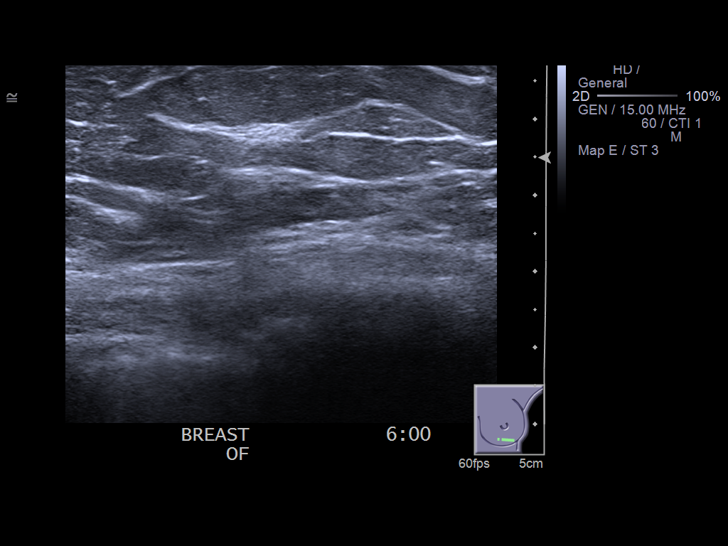
[im 18/21]
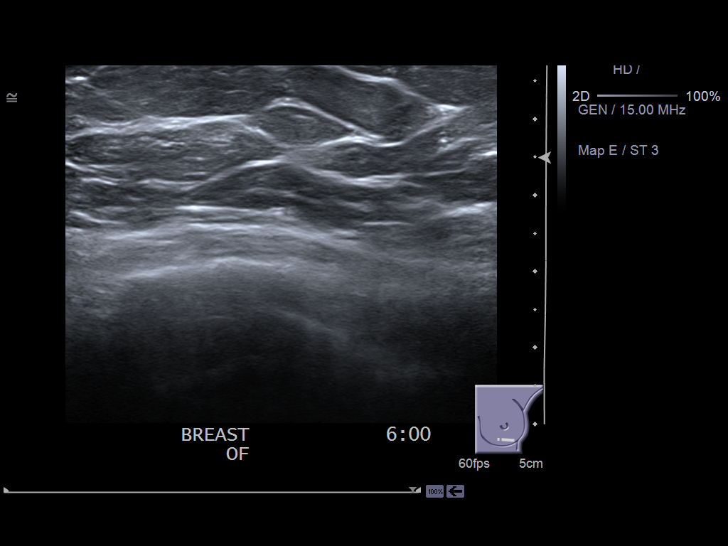
[im 19/21]
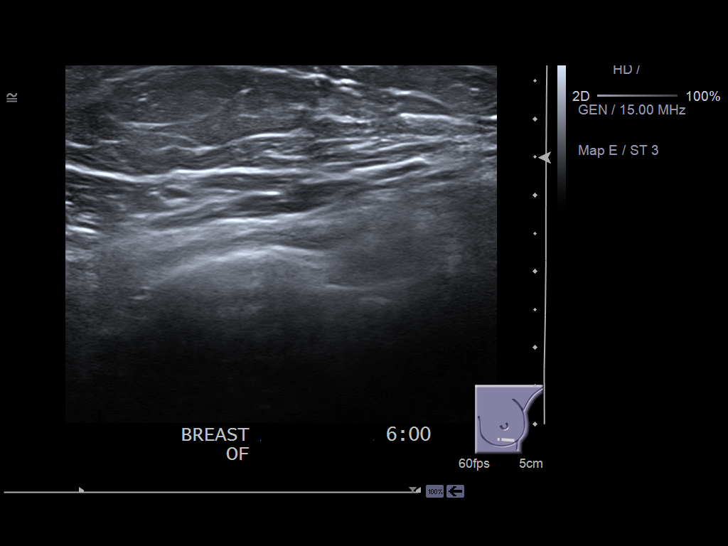
[im 21/21]
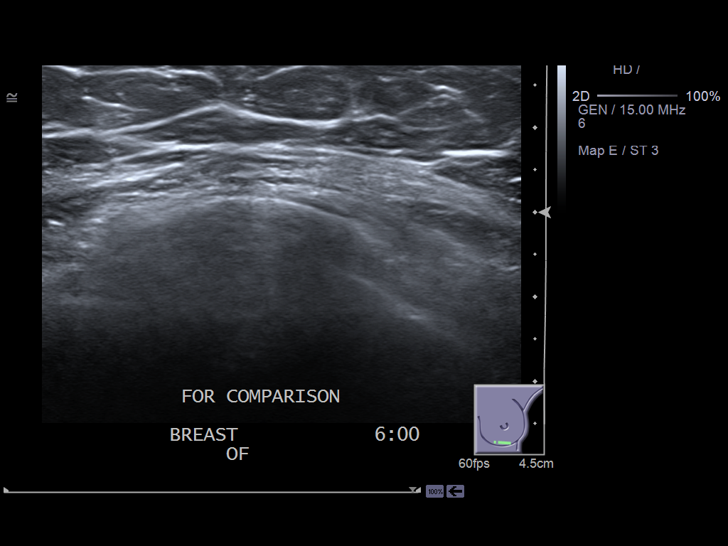

[14 of 21 positions shown; findings below may reference images not displayed]

FINDINGS: The right and left breast was evaluated in the region of clinical
concern at the 6 o'clock position. No solid or cystic sonographic
abnormalities are identified.
IMPRESSION: Unremarkable focus right and left breast ultrasound as
described above. No solid or cystic sonographic abnormalities are
identified. If there is persistent clinical concern or palpable abnormality,
mammographic evaluation is recommended as well as surgical consultation.

## 2013-02-16 ENCOUNTER — Other Ambulatory Visit: Payer: Self-pay

## 2013-03-06 ENCOUNTER — Encounter: Payer: Self-pay | Admitting: Obstetrics and Gynecology

## 2013-05-19 ENCOUNTER — Observation Stay: Payer: Self-pay | Admitting: Obstetrics and Gynecology

## 2013-05-19 LAB — URINALYSIS, COMPLETE
BACTERIA: NONE SEEN
BLOOD: NEGATIVE
Bilirubin,UR: NEGATIVE
GLUCOSE, UR: NEGATIVE mg/dL (ref 0–75)
Ketone: NEGATIVE
Leukocyte Esterase: NEGATIVE
Nitrite: NEGATIVE
PH: 6 (ref 4.5–8.0)
PROTEIN: NEGATIVE
RBC,UR: 1 /HPF (ref 0–5)
Specific Gravity: 1.009 (ref 1.003–1.030)

## 2013-08-20 ENCOUNTER — Observation Stay: Payer: Self-pay | Admitting: Obstetrics and Gynecology

## 2013-09-05 ENCOUNTER — Inpatient Hospital Stay: Payer: Self-pay

## 2013-09-05 LAB — CBC WITH DIFFERENTIAL/PLATELET
BASOS PCT: 0.4 %
Basophil #: 0.1 10*3/uL (ref 0.0–0.1)
EOS PCT: 1.2 %
Eosinophil #: 0.2 10*3/uL (ref 0.0–0.7)
HCT: 34.2 % — AB (ref 35.0–47.0)
HGB: 11 g/dL — ABNORMAL LOW (ref 12.0–16.0)
Lymphocyte #: 1.5 10*3/uL (ref 1.0–3.6)
Lymphocyte %: 9.6 %
MCH: 26.7 pg (ref 26.0–34.0)
MCHC: 32.1 g/dL (ref 32.0–36.0)
MCV: 83 fL (ref 80–100)
Monocyte #: 0.4 x10 3/mm (ref 0.2–0.9)
Monocyte %: 2.8 %
Neutrophil #: 13.4 10*3/uL — ABNORMAL HIGH (ref 1.4–6.5)
Neutrophil %: 86 %
Platelet: 398 10*3/uL (ref 150–440)
RBC: 4.11 10*6/uL (ref 3.80–5.20)
RDW: 14.5 % (ref 11.5–14.5)
WBC: 15.6 10*3/uL — ABNORMAL HIGH (ref 3.6–11.0)

## 2013-09-05 LAB — GC/CHLAMYDIA PROBE AMP

## 2013-09-07 LAB — HEMATOCRIT: HCT: 31.2 % — ABNORMAL LOW (ref 35.0–47.0)

## 2014-08-21 NOTE — H&P (Signed)
L&D Evaluation:  History Expanded:  HPI 24 yo G1 at [redacted]w[redacted]d gestational age by 1st trimester ultrasound.  Her pregnancy was complicated by Rh negative blood type.  She had a negative first trimester screen.  However, she had a thickened nuchal translucency of 77mm.  She was referred to [facility] for any additional investigational measures. She underwent cell-free fetal DNA testing using Informaseq which returned as negative.  She otherwise had a normal mid-pregnancy scan.   She presented with painful, regular uterine contractions.  She denied vaginal bleeding and leakage of fluid.  She noted good fetal movement.   Gravida 1   Blood Type (Maternal) A negative   Group B Strep Results Maternal (Result >5wks must be treated as unknown) negative  08/24/13   Maternal HIV Negative   Maternal Syphilis Ab Nonreactive   Maternal Varicella Immune   Rubella Results (Maternal) immune   [facility] 10-Sep-2013   Patient's Medical History No Chronic Illness   Patient's Surgical History 1) T&A, 2) wisdom teeth extraction   Medications Pre Natal Vitamins   Allergies NKDA   Social History none   Family History Non-Contributory   ROS:  ROS All systems were reviewed.  HEENT, CNS, GI, GU, Respiratory, CV, Renal and Musculoskeletal systems were found to be normal., unless noted in HPI   Exam:  Vital Signs stable   General no apparent distress   Mental Status clear   Chest clear   Heart normal sinus rhythm   Abdomen gravid, non-tender   Estimated Fetal Weight Average for gestational age   Fetal Position cephalic   Edema no edema   Pelvic change from 2-4 cm per RN   Mebranes Intact   FHT normal rate with no decels   FHT Description 120/mod var/+accels/no decels   Ucx not tracing well.  frequent based on patient response   Skin no lesions   Impression:  Impression active labor   Plan:  Comments 1) Labor: expectant management  2) Fetus - category I tracing  3) PNL A  negative / ABSC negative / RI / VZI / HIV neg / RPR NR / HBsAg neg / 1st trimester screen negative & NIFT negative / 1-hr OGTT 1085 / GBS negative     - s/p rhogam at 06/12/13  4) TDAP given 07/10/13  5) Disposition - home postpartum   Labs:  Lab Results: Routine Micro:  26-May-15 18:26   Micro Text Report CHLAM/N.GC RT-PCR ([facility])   CHLAMYDIA                 CHLAMYDIA TRACHOMATIS NEGATIVE   N.GONORRHOEAE             N.GONORRHOEAE NEGATIVE   ANTIBIOTIC                       Routine Hem:  26-May-15 18:26   WBC (CBC)  15.6  Hemoglobin (CBC)  11.0  Hematocrit (CBC)  34.2  Platelet Count (CBC) 398   Electronic Signatures: Will Bonnet (MD)  (Signed 27-May-15 04:09)  Authored: L&D Evaluation, Labs   Last Updated: 27-May-15 04:09 by Will Bonnet (MD)

## 2014-08-21 NOTE — H&P (Signed)
L&D Evaluation:  History Expanded:  HPI 24 yo G1Po at 23 weeks who presents with pain in her abdomen since 5am on Thursday morning, she comes in to see what is happenig. A NEG/VZI/RI/had normal NT testing, has only had one glass of water today, drinks soda and tea instead.she denies leakage or discharge or bloody show, she also did not feel the baby move a lot today.   Gravida 1   Term 0   PreTerm 0   Abortion 0   Living 0   Blood Type (Maternal) A negative   Group B Strep Results Maternal (Result >5wks must be treated as unknown) unknown/result > 5 weeks ago   Maternal HIV Negative   Maternal Syphilis Ab Nonreactive   Maternal Varicella Immune   Rubella Results (Maternal) immune   Maternal T-Dap Immune   [facility] 10-Sep-2013   Presents with abdominal pain   Patient's Medical History No Chronic Illness   Patient's Surgical History none   Medications Pre Natal Vitamins   Allergies NKDA   Social History none   Family History Non-Contributory   ROS:  ROS All systems were reviewed.  HEENT, CNS, GI, GU, Respiratory, CV, Renal and Musculoskeletal systems were found to be normal.   Exam:  Vital Signs stable   Urine Protein negative dipstick   General other, in mild pain with ctx whic are miniaml by palpation   Mental Status clear   Chest clear   Heart normal sinus rhythm   Abdomen gravid, tender with contractions   Estimated Fetal Weight Average for gestational age   Back no CVAT   Edema no edema   Reflexes 1+   Pelvic no external lesions   Mebranes Intact   FHT normal rate with no decels   Fetal Heart Rate 140   Ucx having irritability with small hills on the toco.   Skin dry   Lymph no lymphadenopathy   Impression:  Impression dehydration   Plan:  Plan UA, EFM/NST   Comments cervix is closed and the ctx are mild, will give terb x 1, and start fluids po, encourage more water at home   Follow Up Appointment need to schedule    Electronic Signatures: Erik Obey (MD)  (Signed 06-Feb-15 08:02)  Authored: L&D Evaluation   Last Updated: 06-Feb-15 08:02 by Erik Obey (MD)

## 2014-08-21 NOTE — H&P (Signed)
L&D Evaluation:  History:  HPI 24 yo G1 at [redacted]w[redacted]d  D=12wk [place] derived [place] of 09/10/2013 presenting with irregular contractions that started this morning in church.  No LOF, no VB, +FM   Presents with contractions   Patient's Medical History No Chronic Illness   Patient's Surgical History none   Medications Pre Natal Vitamins   Allergies NKDA   Social History none   Family History Non-Contributory   ROS:  ROS All systems were reviewed.  HEENT, CNS, GI, GU, Respiratory, CV, Renal and Musculoskeletal systems were found to be normal.   Exam:  Vital Signs stable   Urine Protein negative dipstick   General other   Mental Status clear   Chest clear   Heart normal sinus rhythm   Abdomen gravid, tender with contractions   Estimated Fetal Weight Average for gestational age   Back no CVAT   Edema no edema   Reflexes 1+   Pelvic no external lesions, ftp-1cm   Mebranes Intact   FHT normal rate with no decels, reactive NST category I tracing   Ucx absent   Skin dry   Lymph no lymphadenopathy   Impression:  Impression dehydration   Plan:  Plan UA, EFM/NST   Comments 1) R/O labor - cervix unchanged from prior check no contractions noted on tocometer  2) Fetus - category I tracing, rective NST     - prepregnancy weight 200lbs, 5lbs weight gain     - 1-hr 108  3) PNL A neg / ABSC neg / RI / VZI / HBsAg neg / HIV neg / RPR NR / 1st trimester screen but DSR 1:570 elevated above age related mean / Harmonxy normaly XY karyotype / 1-hr 108     - rhogam 06/13/2013  4) TDAP 07/10/2013 Influenza vaccination 02/15/2013  5) Discharge home has follow up in place 08/21/13   Follow Up Appointment need to schedule   Electronic Signatures: Dorthula Nettles (MD)  (Signed 10-May-15 21:51)  Authored: L&D Evaluation   Last Updated: 10-May-15 21:51 by Dorthula Nettles (MD)

## 2014-11-23 LAB — OB RESULTS CONSOLE HEPATITIS B SURFACE ANTIGEN: HEP B S AG: NEGATIVE

## 2014-11-23 LAB — OB RESULTS CONSOLE RPR: RPR: NONREACTIVE

## 2014-11-23 LAB — OB RESULTS CONSOLE RUBELLA ANTIBODY, IGM: RUBELLA: IMMUNE

## 2014-11-23 LAB — OB RESULTS CONSOLE ABO/RH: RH Type: NEGATIVE

## 2014-11-23 LAB — OB RESULTS CONSOLE HIV ANTIBODY (ROUTINE TESTING): HIV: NONREACTIVE

## 2014-11-23 LAB — OB RESULTS CONSOLE VARICELLA ZOSTER ANTIBODY, IGG: VARICELLA IGG: IMMUNE

## 2015-04-14 NOTE — L&D Delivery Note (Signed)
Delivery Note At 9:14 AM a viable female was delivered via Vaginal, Spontaneous Delivery (Presentation: Right Occiput Anterior) after a rapid labor. APGAR: 8, 9; weight 8 lb (3629 g).   Placenta status: Intact, Spontaneous.  Cord: 3 vessels with the following complications: CAN x1, baby delivered thru cord   Anesthesia: None  Episiotomy: None Lacerations: 1st degree Suture Repair: none Est. Blood Loss (mL): 400  Mom to postpartum.  Baby to Couplet care / Skin to Skin. Baby Jacqueline Fields/ Breast feeding  Jacqueline Fields 07/10/2015, 10:00 AM

## 2015-04-17 LAB — OB RESULTS CONSOLE RPR: RPR: NONREACTIVE

## 2015-04-17 LAB — OB RESULTS CONSOLE HIV ANTIBODY (ROUTINE TESTING): HIV: NONREACTIVE

## 2015-05-20 ENCOUNTER — Inpatient Hospital Stay
Admission: EM | Admit: 2015-05-20 | Discharge: 2015-05-20 | Disposition: A | Discharge status: Home / Self Care | Payer: Self-pay | Admitting: Obstetrics and Gynecology

## 2015-05-20 NOTE — OB Triage Note (Signed)
Pt. reports on three episodes in the past week (first episode Monday, January 30) leaking through three pads. She describes the discharge as "usually clear, sometimes clumpy;" not sure if it is urine or amniotic fluid. Endorses god fetal movement. Denies VB.

## 2015-05-21 NOTE — Final Progress Note (Signed)
Physician Final Progress Note  Patient ID: Jacqueline Fields MRN: [id number] DOB/AGE: Aug 17, 1990 25 y.o.  Admit date: 05/20/2015 Admitting provider: Aletha Halim, MD Discharge date: 05/21/2015   Admission Diagnoses: Leakage of fluid IUP at 33 weeks  Discharge Diagnoses: IUP at 33 weeks Bacterial vaginosis  Consults: none  Significant Findings/ Diagnostic Studies: 25 year old G2 P1001 with EDC=07/08/2015 by LMP=7wk ultrasound presents at 33 weeks with complaints of 3 episodes of leakage of fluid over the last week. She states she had a gush of fluid a week ago that she attributed to her bladder. On 2/3  she had LOF thru her underwear onto bedding and today she had a gush saturating a pad with clear fluid that did not have a urine smell. She was not leaking when she arrived on L&D. She denied vulvar itching or irritation. Denies contractions. Baby active. Prenatal care at [facility] remarkable for obesity, being RH negative (received Rhogam), and anemia. She delivered her first child at term. EXAM: BP 117/75 mmHg  Pulse 104  Temp(Src) 98.1 F (36.7 C) (Oral)  Ht 5\' 7"  (1.702 m)  Wt 97.523 kg (215 lb)  BMI 33.67 kg/m2  LMP 10/01/2014  General : in NAD  SSE: Vulva: no inflammation or lesions Vagina: small amt homogenous white discharge Negative Nitrazine, neg pooling, neg fern Wet prep positive for clue cells, negative for trich or hyphae Cervix closed/50%/OOP/vtx on [place] AFI=12.5cm FHR:130s with accelerations to 160s to 170s, moderate variability Toco: occasional mild contraction A: Bacterial vaginosis No evidence of PPROM P: RX for Flagyl 500 mgm bID x7 days, Diflucan 150 mgm every 3 d x 2 doses FU at [facility] as scheduled and prn  Procedures: NONe  Discharge Condition: stable  Disposition: 01-Home or Self Care  Diet: as tolerated  Discharge Activity: as tolerated      Total time spent taking care of this patient: 20 minutes  Signed: Dalia Heading 05/21/2015, 8:47 AM

## 2015-06-11 LAB — OB RESULTS CONSOLE GC/CHLAMYDIA
Chlamydia: NEGATIVE
Gonorrhea: NEGATIVE

## 2015-06-11 LAB — OB RESULTS CONSOLE GBS: GBS: NEGATIVE

## 2015-06-14 ENCOUNTER — Observation Stay
Admission: EM | Admit: 2015-06-14 | Discharge: 2015-06-14 | Disposition: A | Discharge status: Home / Self Care | Payer: Managed Care, Other (non HMO) | Attending: Obstetrics and Gynecology | Admitting: Obstetrics and Gynecology

## 2015-06-14 DIAGNOSIS — O360930 Maternal care for other rhesus isoimmunization, third trimester, not applicable or unspecified: Secondary | ICD-10-CM | POA: Diagnosis not present

## 2015-06-14 DIAGNOSIS — Z3A36 36 weeks gestation of pregnancy: Secondary | ICD-10-CM | POA: Diagnosis not present

## 2015-06-14 DIAGNOSIS — Z3493 Encounter for supervision of normal pregnancy, unspecified, third trimester: Secondary | ICD-10-CM

## 2015-06-14 LAB — URINALYSIS COMPLETE WITH MICROSCOPIC (ARMC ONLY)
BACTERIA UA: NONE SEEN
Bilirubin Urine: NEGATIVE
GLUCOSE, UA: NEGATIVE mg/dL
HGB URINE DIPSTICK: NEGATIVE
Ketones, ur: NEGATIVE mg/dL
LEUKOCYTES UA: NEGATIVE
NITRITE: NEGATIVE
PROTEIN: NEGATIVE mg/dL
SPECIFIC GRAVITY, URINE: 1.014 (ref 1.005–1.030)
pH: 6 (ref 5.0–8.0)

## 2015-06-14 NOTE — Plan of Care (Signed)
Discharge instructions given and reviewed. Verbalized understanding. Signed copy on chart and one in hand.  Discharged in stable and ambulatory condition with family at side.

## 2015-06-14 NOTE — OB Triage Note (Signed)
Recvd to Observation room 2 with c/o contractions since yesterday afternoon.   Changed to gown and to bed.  EFM applied.  Plan of care discussed and oriented to room.  Verbalized understanding and agrees with plan of care.

## 2015-06-14 NOTE — Final Progress Note (Addendum)
Obstetrics Admission History & Physical  06/14/2015 - 11:50 AM Primary OBGYN: [facility]  Chief Complaint: abdominal carmping  History of Present Illness  25 y.o. G2P1001 @ [redacted]w[redacted]d ([place] 3/27 dated by LMP=7), with the above CC. Pregnancy complicated by: Rh negative, BMI 33.  Ms. Dayana Hergert Jauregui states that started earlier this morning. Doesn't sound to have regularity. No VB, LOF or decreased FM. Was 1/30/-2 on 2/24  Review of Systems: P her 12 point review of systems is negative or as noted in the History of Present Illness.  PMHx:  Past Medical History  Diagnosis Date  . Anemia   . Allergy    PSHx:  Past Surgical History  Procedure Laterality Date  . Adenoidectomy     Medications:  PNV Albuterol PRN   Allergies: has No Known Allergies. OBHx:  OB History  Gravida Para Term Preterm AB SAB TAB Ectopic Multiple Living  2 1 1  0 0 0 0 0 0 1    # Outcome Date GA Lbr Len/2nd Weight Sex Delivery Anes PTL Lv  2 Current           1 Term             08/2013 7lbs 13oz SVD             FHx:  Family History  Problem Relation Age of Onset  . Diabetes Paternal Grandmother    Soc Hx:  Social History   Social History  . Marital Status: Single    Spouse Name: N/A  . Number of Children: N/A  . Years of Education: N/A   Occupational History  . Not on file.   Social History Main Topics  . Smoking status: Never Smoker   . Smokeless tobacco: Not on file  . Alcohol Use: No  . Drug Use: No  . Sexual Activity: Not on file   Other Topics Concern  . Not on file   Social History Narrative    Objective    Current Vital Signs 24h Vital Sign Ranges  T 98.3 F (36.8 C) Temp  Avg: 98.3 F (36.8 C)  Min: 98.3 F (36.8 C)  Max: 98.3 F (36.8 C)  BP 138/80 mmHg BP  Min: 138/80  Max: 138/80  HR 99 Pulse  Avg: 99  Min: 99  Max: 99  RR 16 Resp  Avg: 16  Min: 16  Max: 16  SaO2     No Data Recorded       24 Hour I/O Current Shift I/O  Time Ins Outs       EFM: 145 baseline,  +accels, no decel, mod var  Toco: quiet  General: Well nourished, well developed female in no acute distress.  Skin:  Warm and dry.  Cardiovascular: Regular rate and rhythm. Respiratory:  Clear to auscultation bilateral. Normal respiratory effort Abdomen: gravid, nttp Neuro/Psych:  Normal mood and affect.   SVE: 1/long/high  Labs  U/a: negative  Radiology none   Assessment & Plan   25 y.o. G2P1001 @ [redacted]w[redacted]d with negative labor eval *IUP: rNST *EOL: negative. D/c to home  Durene Romans. MD [facility] Pager [phone]

## 2015-06-14 NOTE — Discharge Instructions (Signed)
Please keep your scheduled appointment.

## 2015-07-05 ENCOUNTER — Observation Stay
Admission: EM | Admit: 2015-07-05 | Discharge: 2015-07-05 | Disposition: A | Discharge status: Home / Self Care | Payer: Managed Care, Other (non HMO) | Attending: Advanced Practice Midwife | Admitting: Advanced Practice Midwife

## 2015-07-05 DIAGNOSIS — Z3A39 39 weeks gestation of pregnancy: Secondary | ICD-10-CM | POA: Diagnosis not present

## 2015-07-05 DIAGNOSIS — O471 False labor at or after 37 completed weeks of gestation: Secondary | ICD-10-CM | POA: Diagnosis present

## 2015-07-05 NOTE — Discharge Summary (Signed)
Reviewed discharge instructions with patient and family. All verbalized understanding. Copy of instructions given to patient. Stable and ambulatory on discharge.

## 2015-07-05 NOTE — OB Triage Note (Signed)
Pt arrived to obs rm 3 with c/o contractions every 3-68mins starting at 2300 07/04/2015. Did have Routine OB appointment yesterday cervical check was 1cm/70% and membranes were stripped. Placed on monitor and oriented to room.

## 2015-07-05 NOTE — Discharge Instructions (Signed)
Get plenty of rest and fluids.  Return to emergency room in symptoms become worse. Have period like bleeding, contractions 3-5 minutes apart or if your water breaks.

## 2015-07-05 NOTE — Final Progress Note (Signed)
Physician Final Progress Note  Patient ID: Jacqueline Fields MRN: [id number] DOB/AGE: Jul 27, 1990 25 y.o.  Admit date: 07/05/2015 Admitting provider: Gae Dry, MD Discharge date: 07/05/2015   Admission Diagnoses: IUP at 39 4/7/Contractions  Discharge Diagnoses:  Active Problems:   Labor and delivery indication for care or intervention False labor  Consults: None  Significant Findings/ Diagnostic Studies: none  Procedures: NST Reactive, baseline 140, moderate variability, + accels, - decels Toco: contractions q 3-8 min Cervix: 3.5 cm without change after walking for 1.5 hrs  Discharge Condition: good  Disposition: 01-Home or Self Care  Diet: Regular diet  Discharge Activity: Activity as tolerated  Discharge Instructions    Discharge activity:  No Restrictions    Complete by:  As directed      Discharge diet:  No restrictions    Complete by:  As directed      Fetal Kick Count:  Lie on our left side for one hour after a meal, and count the number of times your baby kicks.  If it is less than 5 times, get up, move around and drink some juice.  Repeat the test 30 minutes later.  If it is still less than 5 kicks in an hour, notify your doctor.    Complete by:  As directed      LABOR:  When conractions begin, you should start to time them from the beginning of one contraction to the beginning  of the next.  When contractions are 5 - 10 minutes apart or less and have been regular for at least an hour, you should call your health care provider.    Complete by:  As directed      No sexual activity restrictions    Complete by:  As directed      Notify physician for bleeding from the vagina    Complete by:  As directed      Notify physician for blurring of vision or spots before the eyes    Complete by:  As directed      Notify physician for chills or fever    Complete by:  As directed      Notify physician for fainting spells, "black outs" or loss of consciousness    Complete  by:  As directed      Notify physician for increase in vaginal discharge    Complete by:  As directed      Notify physician for leaking of fluid    Complete by:  As directed      Notify physician for pain or burning when urinating    Complete by:  As directed      Notify physician for pelvic pressure (sudden increase)    Complete by:  As directed      Notify physician for severe or continued nausea or vomiting    Complete by:  As directed      Notify physician for sudden gushing of fluid from the vagina (with or without continued leaking)    Complete by:  As directed      Notify physician for sudden, constant, or occasional abdominal pain    Complete by:  As directed      Notify physician if baby moving less than usual    Complete by:  As directed             Medication List    TAKE these medications        albuterol 108 (90 Base) MCG/ACT inhaler  Commonly  known as:  PROVENTIL HFA;VENTOLIN HFA  Inhale 2 puffs into the lungs every 6 (six) hours as needed for wheezing.     cetirizine 10 MG tablet  Commonly known as:  ZYRTEC  Take 10 mg by mouth daily.     EPIPEN 2-PAK 0.3 mg/0.3 mL Soaj injection  Generic drug:  EPINEPHrine  Inject 1 mg into the muscle as needed. Reported on 07/05/2015     ferrous fumarate 325 (106 Fe) MG Tabs tablet  Commonly known as:  HEMOCYTE - 106 mg FE  Take 1 tablet by mouth.     fexofenadine 180 MG tablet  Commonly known as:  ALLEGRA  Take 180 mg by mouth daily. Reported on 05/20/2015     multivitamin-prenatal 27-0.8 MG Tabs tablet  Take 1 tablet by mouth daily at 12 noon.           Follow-up Information    Follow up with [facility]. Go on 07/12/2015.   Why:  As needed, If symptoms worsen, routine OB appointment       Total time spent taking care of this patient: 20 minutes  Signed: Mahari Strahm  This patient and plan were discussed with Dr Kenton Kingfisher 07/05/2015

## 2015-07-10 ENCOUNTER — Inpatient Hospital Stay
Admission: EM | Admit: 2015-07-10 | Discharge: 2015-07-11 | DRG: 775 | Disposition: A | Discharge status: Home / Self Care | Payer: Managed Care, Other (non HMO) | Attending: Obstetrics & Gynecology | Admitting: Obstetrics & Gynecology

## 2015-07-10 DIAGNOSIS — Z3A40 40 weeks gestation of pregnancy: Secondary | ICD-10-CM

## 2015-07-10 DIAGNOSIS — D649 Anemia, unspecified: Secondary | ICD-10-CM | POA: Diagnosis present

## 2015-07-10 DIAGNOSIS — J45909 Unspecified asthma, uncomplicated: Secondary | ICD-10-CM | POA: Diagnosis present

## 2015-07-10 DIAGNOSIS — O360930 Maternal care for other rhesus isoimmunization, third trimester, not applicable or unspecified: Secondary | ICD-10-CM | POA: Diagnosis present

## 2015-07-10 DIAGNOSIS — R251 Tremor, unspecified: Secondary | ICD-10-CM | POA: Diagnosis present

## 2015-07-10 DIAGNOSIS — O480 Post-term pregnancy: Principal | ICD-10-CM | POA: Diagnosis present

## 2015-07-10 DIAGNOSIS — O9952 Diseases of the respiratory system complicating childbirth: Secondary | ICD-10-CM | POA: Diagnosis present

## 2015-07-10 DIAGNOSIS — O9902 Anemia complicating childbirth: Secondary | ICD-10-CM | POA: Diagnosis present

## 2015-07-10 LAB — CBC
HCT: 31.5 % — ABNORMAL LOW (ref 35.0–47.0)
Hemoglobin: 10.5 g/dL — ABNORMAL LOW (ref 12.0–16.0)
MCH: 26.3 pg (ref 26.0–34.0)
MCHC: 33.2 g/dL (ref 32.0–36.0)
MCV: 79.1 fL — AB (ref 80.0–100.0)
PLATELETS: 471 10*3/uL — AB (ref 150–440)
RBC: 3.98 MIL/uL (ref 3.80–5.20)
RDW: 16.2 % — AB (ref 11.5–14.5)
WBC: 17.4 10*3/uL — AB (ref 3.6–11.0)

## 2015-07-10 LAB — RAPID HIV SCREEN (HIV 1/2 AB+AG)
HIV 1/2 Antibodies: NONREACTIVE
HIV-1 P24 ANTIGEN - HIV24: NONREACTIVE

## 2015-07-10 LAB — ABO/RH: ABO/RH(D): A NEG

## 2015-07-10 LAB — TYPE AND SCREEN
ABO/RH(D): A NEG
Antibody Screen: NEGATIVE

## 2015-07-10 MED ORDER — ONDANSETRON HCL 4 MG/2ML IJ SOLN: 4.0000 mg | Freq: Four times a day (QID) | INTRAMUSCULAR | Status: DC | PRN

## 2015-07-10 MED ORDER — LANOLIN HYDROUS EX OINT: TOPICAL_OINTMENT | CUTANEOUS | Status: DC | PRN

## 2015-07-10 MED ORDER — BENZOCAINE-MENTHOL 20-0.5 % EX AERO: 1.0000 "application " | INHALATION_SPRAY | CUTANEOUS | Status: DC | PRN

## 2015-07-10 MED ORDER — LACTATED RINGERS IV SOLN: 500.0000 mL | INTRAVENOUS | Status: DC | PRN

## 2015-07-10 MED ORDER — LIDOCAINE HCL (PF) 1 % IJ SOLN
INTRAMUSCULAR | Status: AC
  Filled 2015-07-10: qty 30

## 2015-07-10 MED ORDER — DIBUCAINE 1 % RE OINT: 1.0000 "application " | TOPICAL_OINTMENT | RECTAL | Status: DC | PRN

## 2015-07-10 MED ORDER — OXYTOCIN BOLUS FROM INFUSION
500.0000 mL | INTRAVENOUS | Status: DC
  Administered 2015-07-10: 500 mL via INTRAVENOUS

## 2015-07-10 MED ORDER — PRENATAL MULTIVITAMIN CH
1.0000 | ORAL_TABLET | Freq: Every day | ORAL | Status: DC
  Administered 2015-07-11: 1 via ORAL
  Filled 2015-07-10: qty 1

## 2015-07-10 MED ORDER — AMMONIA AROMATIC IN INHA
RESPIRATORY_TRACT | Status: AC
  Filled 2015-07-10: qty 10

## 2015-07-10 MED ORDER — ONDANSETRON HCL 4 MG/2ML IJ SOLN: 4.0000 mg | INTRAMUSCULAR | Status: DC | PRN

## 2015-07-10 MED ORDER — MISOPROSTOL 200 MCG PO TABS
ORAL_TABLET | ORAL | Status: DC
Start: 2015-07-10 — End: 2015-07-10
  Filled 2015-07-10: qty 4

## 2015-07-10 MED ORDER — OXYTOCIN 10 UNIT/ML IJ SOLN
INTRAMUSCULAR | Status: AC
  Filled 2015-07-10: qty 2

## 2015-07-10 MED ORDER — LACTATED RINGERS IV SOLN
INTRAVENOUS | Status: DC
  Administered 2015-07-10: 08:00:00 via INTRAVENOUS

## 2015-07-10 MED ORDER — ACETAMINOPHEN 325 MG PO TABS: 650.0000 mg | ORAL_TABLET | ORAL | Status: DC | PRN

## 2015-07-10 MED ORDER — SIMETHICONE 80 MG PO CHEW: 80.0000 mg | CHEWABLE_TABLET | ORAL | Status: DC | PRN

## 2015-07-10 MED ORDER — BUTORPHANOL TARTRATE 1 MG/ML IJ SOLN: 1.0000 mg | INTRAMUSCULAR | Status: DC | PRN

## 2015-07-10 MED ORDER — FAMOTIDINE 20 MG PO TABS: 20.0000 mg | ORAL_TABLET | Freq: Two times a day (BID) | ORAL | Status: DC | PRN

## 2015-07-10 MED ORDER — FAMOTIDINE 20 MG PO TABS: 20.0000 mg | ORAL_TABLET | Freq: Two times a day (BID) | ORAL | Status: DC

## 2015-07-10 MED ORDER — IBUPROFEN 600 MG PO TABS
600.0000 mg | ORAL_TABLET | Freq: Four times a day (QID) | ORAL | Status: DC | PRN
  Administered 2015-07-10 – 2015-07-11 (×2): 600 mg via ORAL
  Filled 2015-07-10 (×2): qty 1

## 2015-07-10 MED ORDER — WITCH HAZEL-GLYCERIN EX PADS: 1.0000 "application " | MEDICATED_PAD | CUTANEOUS | Status: DC | PRN

## 2015-07-10 MED ORDER — FERROUS SULFATE 325 (65 FE) MG PO TABS
325.0000 mg | ORAL_TABLET | Freq: Every day | ORAL | Status: DC
  Administered 2015-07-11: 325 mg via ORAL
  Filled 2015-07-10: qty 1

## 2015-07-10 MED ORDER — OXYTOCIN 40 UNITS IN LACTATED RINGERS INFUSION - SIMPLE MED
2.5000 [IU]/h | INTRAVENOUS | Status: DC
  Filled 2015-07-10: qty 1000

## 2015-07-10 MED ORDER — ONDANSETRON HCL 4 MG PO TABS: 4.0000 mg | ORAL_TABLET | ORAL | Status: DC | PRN

## 2015-07-10 MED ORDER — DOCUSATE SODIUM 100 MG PO CAPS
100.0000 mg | ORAL_CAPSULE | Freq: Every day | ORAL | Status: DC
  Administered 2015-07-10 – 2015-07-11 (×2): 100 mg via ORAL
  Filled 2015-07-10 (×2): qty 1

## 2015-07-10 MED ORDER — FENTANYL 2.5 MCG/ML W/ROPIVACAINE 0.2% IN NS 100 ML EPIDURAL INFUSION (ARMC-ANES)
EPIDURAL | Status: DC
Start: 2015-07-10 — End: 2015-07-10
  Filled 2015-07-10: qty 100

## 2015-07-10 NOTE — H&P (Signed)
Obstetrics Admission History & Physical   CC: Contractions  HPI:  25 y.o. G2P1001 @ [redacted]w[redacted]d (07/08/2015, by Last Menstrual Period). Admitted on 07/10/2015:   Patient Active Problem List   Diagnosis Date Noted  . Normal labor 07/10/2015  . Labor and delivery indication for care or intervention 07/05/2015  . Supervision of normal pregnancy in third trimester 06/14/2015  . Irregular menses 11/30/2012  . Menstrual cramps 11/30/2012  . Routine general medical examination at a health care facility 10/11/2012  . Encounter for routine gynecological examination 10/11/2012  . Tremors of nervous system 08/27/2011  . Heart palpitations 08/27/2011  . Asthma 08/27/2011  . Anemia   . Allergy     Presents for  Painful ctxs since 0230; SROM while in labor bed here.  No VB.  PNC without complications. Obesity. A-. P[facility] at: at [facility]  PMHx:  Past Medical History  Diagnosis Date  . Anemia   . Allergy    PSHx:  Past Surgical History  Procedure Laterality Date  . Adenoidectomy     Medications:  Prescriptions prior to admission  Medication Sig Dispense Refill Last Dose  . albuterol (PROVENTIL HFA;VENTOLIN HFA) 108 (90 BASE) MCG/ACT inhaler Inhale 2 puffs into the lungs every 6 (six) hours as needed for wheezing. 1 Inhaler 0 07/09/2015  . cetirizine (ZYRTEC) 10 MG tablet Take 10 mg by mouth daily.   07/09/2015  . EPIPEN 2-PAK 0.3 MG/0.3ML SOAJ injection Inject 1 mg into the muscle as needed. Reported on 07/05/2015   07/09/2015  . ferrous fumarate (HEMOCYTE - 106 MG FE) 325 (106 Fe) MG TABS tablet Take 1 tablet by mouth.   07/09/2015  . fexofenadine (ALLEGRA) 180 MG tablet Take 180 mg by mouth daily. Reported on 05/20/2015   07/09/2015  . Prenatal Vit-Fe Fumarate-FA (MULTIVITAMIN-PRENATAL) 27-0.8 MG TABS tablet Take 1 tablet by mouth daily at 12 noon.   07/09/2015   Allergies: has No Known Allergies. OBHx:  OB History  Gravida Para Term Preterm AB SAB TAB Ectopic Multiple Living  2 1 1  0 0 0 0  0 0 1    # Outcome Date GA Lbr Len/2nd Weight Sex Delivery Anes PTL Lv  2 Current           1 Term              [id number] except as detailed in HPI. Soc Hx: Pregnancy welcomed  Objective:  There were no vitals filed for this visit. General: Well nourished, well developed female in no acute distress.  Skin: Warm and dry.  Cardiovascular:Regular rate and rhythm. Respiratory: Clear to auscultation bilateral. Normal respiratory effort Abdomen: moderate Neuro/Psych: Normal mood and affect.   Pelvic exam: is not limited by body habitus EGBUS: within normal limits Vagina: within normal limits and with normal mucosa blood in the vault Cervix: 5/90/0 Uterus: Spontaneous uterine activity  Adnexa: not evaluated  EFM:FHR: 140 bpm, variability: moderate,  accelerations:  Present,  decelerations:  Absent Toco: Frequency: Every 5 minutes   Perinatal info:  Blood type: A negative Rubella- Immune Varicella -Immune TDaP Given during third trimester of this pregnancy RPR NR / HIV Neg/ HBsAg Neg   Assessment & Plan:   25 y.o. G2P1001 @ [redacted]w[redacted]d, Admitted on 07/10/2015: Early labor, progressing with cervical  change and SROM Admit for labor, Fetal Wellbeing Reassuring and Epidural when ready

## 2015-07-10 NOTE — Discharge Summary (Signed)
Physician Obstetric Discharge Summary  Patient ID: Jacqueline Fields MRN: [id number] DOB/AGE: 05-Jan-1991 25 y.o.   Date of Admission: 07/10/2015  Date of Discharge: 07/11/2015  Admitting Diagnosis: Onset of Labor at [redacted]w[redacted]d  Secondary Diagnosis: Anemia in pregnancy, RH negative status and asthma, allergies, tremors of nervous system  Mode of Delivery: normal spontaneous vaginal delivery 07/10/2015     Discharge Diagnosis: Term intrauterine pregnancy-delivered   Intrapartum Procedures: none   Post partum procedures: rhogam  Complications: none   Brief Hospital Course  Jacqueline Fields is a G2P2001 who had a SVD on 07/10/2015 after a rapid active phase of labor;  for further details of this delivery, please refer to the delivery note.  Patient had an uncomplicated postpartum course.  By time of discharge on PPD#1, her pain was controlled on oral pain medications; she had appropriate lochia and was ambulating, voiding without difficulty and tolerating regular diet.  She was deemed stable for discharge to home.    Labs: CBC Latest Ref Rng 07/11/2015 07/10/2015 09/07/2013  WBC 3.6 - 11.0 K/uL 15.4(H) 17.4(H) -  Hemoglobin 12.0 - 16.0 g/dL 9.6(L) 10.5(L) -  Hematocrit 35.0 - 47.0 % 29.2(L) 31.5(L) 31.2(L)  Platelets 150 - 440 K/uL 374 471(H) -   A NEG  Physical exam:  Blood pressure 106/64, pulse 74, temperature 98.3 F (36.8 C), temperature source Oral, resp. rate 20, height 5\' 7"  (1.702 m), weight 100.699 kg (222 lb), last menstrual period 10/01/2014, SpO2 99 %, unknown if currently breastfeeding. General: alert and no distress Lochia: appropriate Abdomen: soft, NT Uterine Fundus: firm Incision: NA Extremities: No evidence of DVT seen on physical exam. No lower extremity edema.  Discharge Instructions: Per After Visit Summary. Activity: Advance as tolerated. Pelvic rest for 6 weeks.  Also refer to Discharge Instructions Diet: Regular Medications:   Medication List    TAKE these  medications        albuterol 108 (90 Base) MCG/ACT inhaler  Commonly known as:  PROVENTIL HFA;VENTOLIN HFA  Inhale 2 puffs into the lungs every 6 (six) hours as needed for wheezing.     cetirizine 10 MG tablet  Commonly known as:  ZYRTEC  Take 10 mg by mouth daily.     EPIPEN 2-PAK 0.3 mg/0.3 mL Soaj injection  Generic drug:  EPINEPHrine  Inject 1 mg into the muscle as needed. Reported on 07/05/2015     ferrous fumarate 325 (106 Fe) MG Tabs tablet  Commonly known as:  HEMOCYTE - 106 mg FE  Take 1 tablet by mouth.     fexofenadine 180 MG tablet  Commonly known as:  ALLEGRA  Take 180 mg by mouth daily. Reported on 05/20/2015     multivitamin-prenatal 27-0.8 MG Tabs tablet  Take 1 tablet by mouth daily at 12 noon.     norethindrone 5 MG tablet  Commonly known as:  AYGESTIN  Take 1 tablet (5 mg total) by mouth daily.  Start taking on:  07/24/2015       Outpatient follow up:  Follow-up Information    Follow up with GUTIERREZ, COLLEEN, CNM. Schedule an appointment as soon as possible for a visit in 6 weeks.   Specialty:  Certified Nurse Midwife   Why:  postpartum follow up visit   Contact information:   [place] [address] [phone]      Postpartum contraception: oral progesterone-only contraceptive  Discharged Condition: good  Discharged to: home  Rhogam prior to DC   Newborn Data: Disposition:home with mother  Apgars: APGAR (1 MIN): 8   APGAR (5 MINS): 9   APGAR (10 MINS):   Weight: 8#/3629g Blood Type: O+  Baby Feeding: Breast / Bertram Millard, CNM   This patient and plan were discussed with Dr Georgianne Fick 07/11/2015

## 2015-07-11 LAB — CBC
HCT: 29.2 % — ABNORMAL LOW (ref 35.0–47.0)
Hemoglobin: 9.6 g/dL — ABNORMAL LOW (ref 12.0–16.0)
MCH: 26 pg (ref 26.0–34.0)
MCHC: 33 g/dL (ref 32.0–36.0)
MCV: 78.9 fL — ABNORMAL LOW (ref 80.0–100.0)
Platelets: 374 10*3/uL (ref 150–440)
RBC: 3.7 MIL/uL — ABNORMAL LOW (ref 3.80–5.20)
RDW: 16.3 % — AB (ref 11.5–14.5)
WBC: 15.4 10*3/uL — ABNORMAL HIGH (ref 3.6–11.0)

## 2015-07-11 LAB — RPR: RPR Ser Ql: NONREACTIVE

## 2015-07-11 LAB — FETAL SCREEN: FETAL SCREEN: NEGATIVE

## 2015-07-11 MED ORDER — NORETHINDRONE ACETATE 5 MG PO TABS: 5.0000 mg | ORAL_TABLET | Freq: Every day | ORAL | Status: DC

## 2015-07-11 MED ORDER — RHO D IMMUNE GLOBULIN 1500 UNIT/2ML IJ SOSY
300.0000 ug | PREFILLED_SYRINGE | Freq: Once | INTRAMUSCULAR | Status: AC
  Administered 2015-07-11: 300 ug via INTRAMUSCULAR
  Filled 2015-07-11: qty 2

## 2015-07-12 LAB — RHOGAM INJECTION: UNIT DIVISION: 0

## 2015-11-14 ENCOUNTER — Emergency Department: Payer: BLUE CROSS/BLUE SHIELD

## 2015-11-14 ENCOUNTER — Observation Stay
Admission: EM | Admit: 2015-11-14 | Discharge: 2015-11-15 | Disposition: A | Discharge status: Home / Self Care | Payer: BLUE CROSS/BLUE SHIELD | Attending: General Surgery | Admitting: General Surgery

## 2015-11-14 DIAGNOSIS — Z79899 Other long term (current) drug therapy: Secondary | ICD-10-CM | POA: Diagnosis not present

## 2015-11-14 DIAGNOSIS — Z833 Family history of diabetes mellitus: Secondary | ICD-10-CM | POA: Diagnosis not present

## 2015-11-14 DIAGNOSIS — D649 Anemia, unspecified: Secondary | ICD-10-CM | POA: Insufficient documentation

## 2015-11-14 DIAGNOSIS — K8020 Calculus of gallbladder without cholecystitis without obstruction: Secondary | ICD-10-CM | POA: Diagnosis not present

## 2015-11-14 DIAGNOSIS — Z9889 Other specified postprocedural states: Secondary | ICD-10-CM | POA: Insufficient documentation

## 2015-11-14 DIAGNOSIS — R1011 Right upper quadrant pain: Secondary | ICD-10-CM | POA: Insufficient documentation

## 2015-11-14 DIAGNOSIS — J45990 Exercise induced bronchospasm: Secondary | ICD-10-CM | POA: Insufficient documentation

## 2015-11-14 DIAGNOSIS — K819 Cholecystitis, unspecified: Secondary | ICD-10-CM | POA: Diagnosis not present

## 2015-11-14 DIAGNOSIS — Z9109 Other allergy status, other than to drugs and biological substances: Secondary | ICD-10-CM | POA: Insufficient documentation

## 2015-11-14 DIAGNOSIS — K8012 Calculus of gallbladder with acute and chronic cholecystitis without obstruction: Principal | ICD-10-CM | POA: Insufficient documentation

## 2015-11-14 LAB — COMPREHENSIVE METABOLIC PANEL
ALK PHOS: 123 U/L (ref 38–126)
ALT: 239 U/L — ABNORMAL HIGH (ref 14–54)
ANION GAP: 8 (ref 5–15)
AST: 543 U/L — ABNORMAL HIGH (ref 15–41)
Albumin: 3.9 g/dL (ref 3.5–5.0)
BILIRUBIN TOTAL: 0.8 mg/dL (ref 0.3–1.2)
BUN: 9 mg/dL (ref 6–20)
CALCIUM: 9.4 mg/dL (ref 8.9–10.3)
CO2: 23 mmol/L (ref 22–32)
Chloride: 103 mmol/L (ref 101–111)
Creatinine, Ser: 0.64 mg/dL (ref 0.44–1.00)
GFR calc non Af Amer: >60 mL/min (ref >60)
GLUCOSE: 94 mg/dL (ref 65–99)
Potassium: 4.5 mmol/L (ref 3.5–5.1)
Sodium: 134 mmol/L — ABNORMAL LOW (ref 135–145)
Total Protein: 8.2 g/dL — ABNORMAL HIGH (ref 6.5–8.1)

## 2015-11-14 LAB — CBC
HCT: 36 % (ref 35.0–47.0)
Hemoglobin: 12.2 g/dL (ref 12.0–16.0)
MCH: 25.4 pg — ABNORMAL LOW (ref 26.0–34.0)
MCHC: 33.9 g/dL (ref 32.0–36.0)
MCV: 74.9 fL — ABNORMAL LOW (ref 80.0–100.0)
Platelets: 525 10*3/uL — ABNORMAL HIGH (ref 150–440)
RBC: 4.8 MIL/uL (ref 3.80–5.20)
RDW: 15.9 % — ABNORMAL HIGH (ref 11.5–14.5)
WBC: 7.3 10*3/uL (ref 3.6–11.0)

## 2015-11-14 LAB — URINALYSIS COMPLETE WITH MICROSCOPIC (ARMC ONLY)
Bacteria, UA: NONE SEEN
Bilirubin Urine: NEGATIVE
GLUCOSE, UA: NEGATIVE mg/dL
Ketones, ur: NEGATIVE mg/dL
Leukocytes, UA: NEGATIVE
NITRITE: NEGATIVE
Protein, ur: NEGATIVE mg/dL
Specific Gravity, Urine: 1.009 (ref 1.005–1.030)
pH: 7 (ref 5.0–8.0)

## 2015-11-14 LAB — LIPASE, BLOOD: Lipase: 34 U/L (ref 11–51)

## 2015-11-14 LAB — POCT PREGNANCY, URINE: PREG TEST UR: NEGATIVE

## 2015-11-14 MED ORDER — ACETAMINOPHEN 325 MG PO TABS
650.0000 mg | ORAL_TABLET | Freq: Once | ORAL | Status: AC
  Administered 2015-11-14: 650 mg via ORAL
  Filled 2015-11-14: qty 2

## 2015-11-14 MED ORDER — ONDANSETRON HCL 4 MG/2ML IJ SOLN
4.0000 mg | Freq: Four times a day (QID) | INTRAMUSCULAR | Status: DC | PRN
  Administered 2015-11-15: 4 mg via INTRAVENOUS
  Filled 2015-11-14: qty 2

## 2015-11-14 MED ORDER — HYDRALAZINE HCL 20 MG/ML IJ SOLN: 10.0000 mg | INTRAMUSCULAR | Status: DC | PRN

## 2015-11-14 MED ORDER — MORPHINE SULFATE (PF) 4 MG/ML IV SOLN
4.0000 mg | INTRAVENOUS | Status: DC | PRN
  Administered 2015-11-15 (×2): 4 mg via INTRAVENOUS
  Filled 2015-11-14 (×2): qty 1

## 2015-11-14 MED ORDER — DEXTROSE 5 % IV SOLN
2.0000 g | INTRAVENOUS | Status: DC
  Administered 2015-11-14: 2 g via INTRAVENOUS
  Filled 2015-11-14 (×2): qty 2

## 2015-11-14 MED ORDER — ONDANSETRON 4 MG PO TBDP: 4.0000 mg | ORAL_TABLET | Freq: Four times a day (QID) | ORAL | Status: DC | PRN

## 2015-11-14 MED ORDER — ONDANSETRON 4 MG PO TBDP
4.0000 mg | ORAL_TABLET | Freq: Once | ORAL | Status: AC
Start: 2015-11-14 — End: 2015-11-14
  Administered 2015-11-14: 4 mg via ORAL
  Filled 2015-11-14: qty 1

## 2015-11-14 MED ORDER — DIPHENHYDRAMINE HCL 25 MG PO CAPS: 25.0000 mg | ORAL_CAPSULE | Freq: Four times a day (QID) | ORAL | Status: DC | PRN

## 2015-11-14 MED ORDER — POTASSIUM CHLORIDE IN NACL 20-0.45 MEQ/L-% IV SOLN
INTRAVENOUS | Status: DC
  Administered 2015-11-14 – 2015-11-15 (×3): via INTRAVENOUS
  Filled 2015-11-14 (×6): qty 1000

## 2015-11-14 MED ORDER — DIPHENHYDRAMINE HCL 50 MG/ML IJ SOLN: 25.0000 mg | Freq: Four times a day (QID) | INTRAMUSCULAR | Status: DC | PRN

## 2015-11-14 NOTE — ED Provider Notes (Signed)
[facility] Emergency Department Provider Note  ____________________________________________  Time seen: Approximately 1:00 PM  I have reviewed the triage vital signs and the nursing notes.   HISTORY  Chief Complaint Abdominal Pain    HPI Jacqueline Fields is a 25 y.o. female, NAD, presents to the emergency department for evaluation of 12 hour history of right upper quadrant and epigastric abdominal pain. First episode of pain happened in the early morning hours that woke her from sleep. Patient then had two episodes of extreme epigastric/RUQ pain that lasted approximately 3 hours.The episodes both came 2 hours after eating a meal. It was sudden in onset and painful in any position. During these episodes she becomes sweaty and very nauseated. She took ibuprofen without relief or exacerbation of symptoms. The symptoms resolve on their own but over the course of the last few hours has been consistent and a nagging type pain. Patient denies history of diabetes, GERD, pancreatitis, or gastric/peptic ulcers. Patient denies excessive use of anti-inflammatories or alcohol. Denies tobacco use. Has had no changes to urinary bowel habits. Denies coffee-ground emesis or hematochezia. No fevers, chills, body aches.   Past Medical History:  Diagnosis Date  . Allergy   . Anemia     Patient Active Problem List   Diagnosis Date Noted  . Postpartum care following vaginal delivery 07/11/2015  . Normal labor 07/10/2015  . Labor and delivery indication for care or intervention 07/05/2015  . Supervision of normal pregnancy in third trimester 06/14/2015  . Irregular menses 11/30/2012  . Menstrual cramps 11/30/2012  . Routine general medical examination at a health care facility 10/11/2012  . Encounter for routine gynecological examination 10/11/2012  . Tremors of nervous system 08/27/2011  . Heart palpitations 08/27/2011  . Asthma 08/27/2011  . Anemia   . Allergy     Past  Surgical History:  Procedure Laterality Date  . ADENOIDECTOMY      Prior to Admission medications   Medication Sig Start Date End Date Taking? Authorizing Provider  albuterol (PROVENTIL HFA;VENTOLIN HFA) 108 (90 BASE) MCG/ACT inhaler Inhale 2 puffs into the lungs every 6 (six) hours as needed for wheezing. 08/27/11   Lucille Passy, MD  cetirizine (ZYRTEC) 10 MG tablet Take 10 mg by mouth daily.    Historical Provider, MD  EPIPEN 2-PAK 0.3 MG/0.3ML SOAJ injection Inject 1 mg into the muscle as needed. Reported on 07/05/2015 09/01/12   Historical Provider, MD  ferrous fumarate (HEMOCYTE - 106 MG FE) 325 (106 Fe) MG TABS tablet Take 1 tablet by mouth.    Historical Provider, MD  fexofenadine (ALLEGRA) 180 MG tablet Take 180 mg by mouth daily. Reported on 05/20/2015    Historical Provider, MD  norethindrone (AYGESTIN) 5 MG tablet Take 1 tablet (5 mg total) by mouth daily. 07/24/15   Rod Can, CNM  Prenatal Vit-Fe Fumarate-FA (MULTIVITAMIN-PRENATAL) 27-0.8 MG TABS tablet Take 1 tablet by mouth daily at 12 noon.    Historical Provider, MD    Allergies Review of patient's allergies indicates no known allergies.  Family History  Problem Relation Age of Onset  . Diabetes Paternal Grandmother     Social History Social History  Substance Use Topics  . Smoking status: Never Smoker  . Smokeless tobacco: Never Used  . Alcohol use No     Review of Systems  Constitutional: No fever/chills, Fatigue, night sweats, unintentional weight loss Cardiovascular: No chest pain or palpitations Respiratory: No shortness of breath. No wheezing.  Gastrointestinal: Positive RUQ and  epigastric pain That radiates to the back associated with nausea. No vomiting, Diarrhea, constipation. Genitourinary: Negative for vaginal discharge, pelvic pain, dysuria, hematuria. No urinary hesitancy, urgency or increased frequency. Musculoskeletal: No musculoskeletal back or neck pain. Skin: Negative for rash, bruising,  swelling, or redness Neurological: Negative for headaches, focal weakness or numbness. No saddle paresthesias, tingling 10-point ROS otherwise negative.  ____________________________________________   PHYSICAL EXAM:  VITAL SIGNS: ED Triage Vitals  Enc Vitals Group     BP 11/14/15 1147 125/62     Pulse Rate 11/14/15 1147 69     Resp 11/14/15 1147 18     Temp 11/14/15 1147 98.1 F (36.7 C)     Temp Source 11/14/15 1147 Oral     SpO2 11/14/15 1147 97 %     Weight 11/14/15 1147 200 lb (90.7 kg)     Height 11/14/15 1147 5\' 7"  (1.702 m)     Head Circumference --      Peak Flow --      Pain Score 11/14/15 1140 3     Pain Loc --      Pain Edu? --      Excl. in [place]? --      Constitutional: Alert and oriented. Well appearing and in no acute distress. Eyes: Conjunctivae are normal.  Head: Atraumatic. Neck: Supple with FROM Hematological/Lymphatic/Immunilogical: No cervical lymphadenopathy. Cardiovascular: Normal rate, regular rhythm. Normal S1 and S2.  Good peripheral circulation. Respiratory: Normal respiratory effort without tachypnea or retractions. Lungs CTAB with breath sounds noted in all lung fields. Gastrointestinal: Exquisite tenderness noted to palpation of the right upper quadrant but the area is soft without distention but with mild guarding of the area. All other quadrants of abdomen are soft and nontender without distention or guarding. Positive Murphy's sign. No CVA tenderness. Bowel sounds are grossly normoactive in all quadrants. Musculoskeletal: No lower extremity tenderness nor edema.  No joint effusions. Neurologic:  Normal speech and language. No gross focal neurologic deficits are appreciated.  Skin:  Skin is warm, dry and intact. No rash, redness, skin sores, swelling, bruising. Psychiatric: Mood and affect are normal. Speech and behavior are normal. Patient exhibits appropriate insight and judgement.   ____________________________________________   LABS (all  labs ordered are listed, but only abnormal results are displayed)  Labs Reviewed  COMPREHENSIVE METABOLIC PANEL - Abnormal; Notable for the following:       Result Value   Sodium 134 (*)    Total Protein 8.2 (*)    AST 543 (*)    ALT 239 (*)    All other components within normal limits  CBC - Abnormal; Notable for the following:    MCV 74.9 (*)    MCH 25.4 (*)    RDW 15.9 (*)    Platelets 525 (*)    All other components within normal limits  URINALYSIS COMPLETEWITH MICROSCOPIC ([facility] ONLY) - Abnormal; Notable for the following:    Color, Urine YELLOW (*)    APPearance CLEAR (*)    Hgb urine dipstick 3+ (*)    Squamous Epithelial / LPF 0-5 (*)    All other components within normal limits  LIPASE, BLOOD  POC URINE PREG, ED  POCT PREGNANCY, URINE   ____________________________________________  EKG  None ____________________________________________  RADIOLOGY I have personally viewed and evaluated these images (plain radiographs) as part of my medical decision making, as well as reviewing the written report by the radiologist.  US Abdomen Limited Ruq  Result Date: 11/14/2015 CLINICAL DATA:  Right  upper quadrant abdominal pain for 1 day. EXAM: US ABDOMEN LIMITED - RIGHT UPPER QUADRANT COMPARISON:  Ultrasound, 07/04/2010 FINDINGS: Gallbladder: Multiple dependent gallstones. Wall measures between 4-5 mm thickness with some slight pericholecystic fluid. Patient is tender to transducer pressure over the gallbladder. Common bile duct: Diameter: 5.2 mm, no convincing duct stone. Liver: Mild increased parenchymal echogenicity.  No mass or focal lesion. IMPRESSION: 1. Cholelithiasis with sonographic evidence of early acute cholecystitis with mild wall thickening slight pericholecystic fluid and a positive sonographic Murphy's sign. Electronically Signed   By: Lajean Manes M.D.   On: 11/14/2015 13:48    ____________________________________________    PROCEDURES  Procedure(s)  performed: None   Procedures   Medications  acetaminophen (TYLENOL) tablet 650 mg (650 mg Oral Given 11/14/15 1432)  ondansetron (ZOFRAN-ODT) disintegrating tablet 4 mg (4 mg Oral Given 11/14/15 1432)     ____________________________________________   INITIAL IMPRESSION / ASSESSMENT AND PLAN / ED COURSE  Pertinent labs & imaging results that were available during my care of the patient were reviewed by me and considered in my medical decision making (see chart for details).  Clinical Course    ----------------------------------------- 1:59 PM on 11/14/2015 -----------------------------------------  I spoke with Dr. Adonis Huguenin in general surgeon regards to the patient's history, presentation, imaging and lab results consistent with acute cholecystitis. Dr. Adonis Huguenin is entering a colon surgery at this time but states once that is complete he will come to the ED to evaluate the patient. He states this could be several hours.   ----------------------------------------- 3:42 PM on 11/14/2015 -----------------------------------------  Patient's interim care in the emergency department will be turned over to Betha Loa, PA-C until Dr. Adonis Huguenin from general surgery is able to consult and assess the patient for further hospital treatment.      Braxton Feathers, PA-C 11/14/15 1545    Lisa Roca, MD 11/14/15 1600

## 2015-11-14 NOTE — H&P (Signed)
Patient ID: Jacqueline Fields, female   DOB: 1991/02/11, 25 y.o.   MRN: [id number]  CC: ABDOMINAL PAIN  HPI Jacqueline Fields is a 25 y.o. female who presents to the emergency department for evaluation of abdominal pain. Surgery consult requested by PA Hagler. Patient reports that her first bout of abdominal pain was on Saturday, it occurred after eating and lasted for about 2 hours. She had another bout early this morning that lasted for about 2 hours but never fully went away. Just before reporting to the emergency room the pain acutely worsened where she can barely move. She's never had anything like this before. She denies any fevers, chills, chest pain, shortness of breath, diarrhea, constipation. She has had some nausea but no vomiting. She is otherwise in her usual state of health with no changes in medications or recent sick contacts. She states she's had numerous family members with gallbladder problems and has been told that this is exactly what they're for like as well.  HPI  Past Medical History:  Diagnosis Date  . Allergy   . Anemia     Past Surgical History:  Procedure Laterality Date  . ADENOIDECTOMY      Family History  Problem Relation Age of Onset  . Diabetes Paternal Grandmother     Social History Social History  Substance Use Topics  . Smoking status: Never Smoker  . Smokeless tobacco: Never Used  . Alcohol use No    No Known Allergies  No current facility-administered medications for this encounter.    Current Outpatient Prescriptions  Medication Sig Dispense Refill  . albuterol (PROVENTIL HFA;VENTOLIN HFA) 108 (90 BASE) MCG/ACT inhaler Inhale 2 puffs into the lungs every 6 (six) hours as needed for wheezing. 1 Inhaler 0  . cetirizine (ZYRTEC) 10 MG tablet Take 10 mg by mouth daily.    Marland Kitchen EPIPEN 2-PAK 0.3 MG/0.3ML SOAJ injection Inject 1 mg into the muscle as needed. Reported on 07/05/2015    . ferrous fumarate (HEMOCYTE - 106 MG FE) 325 (106 Fe) MG TABS tablet  Take 1 tablet by mouth.    . fexofenadine (ALLEGRA) 180 MG tablet Take 180 mg by mouth daily. Reported on 05/20/2015    . norethindrone (AYGESTIN) 5 MG tablet Take 1 tablet (5 mg total) by mouth daily. 28 tablet 11  . Prenatal Vit-Fe Fumarate-FA (MULTIVITAMIN-PRENATAL) 27-0.8 MG TABS tablet Take 1 tablet by mouth daily at 12 noon.       Review of Systems A Multi-point review of systems was asked and was negative except for the findings documented in the history of present illness  Physical Exam Blood pressure 125/62, pulse 69, temperature 98.1 F (36.7 C), temperature source Oral, resp. rate 18, height 5\' 7"  (1.702 m), weight 90.7 kg (200 lb), last menstrual period 11/14/2015, SpO2 97 %, unknown if currently breastfeeding. CONSTITUTIONAL: Resting in bed in no acute distress. EYES: Pupils are equal, round, and reactive to light, Sclera are non-icteric. EARS, NOSE, MOUTH AND THROAT: The oropharynx is clear. The oral mucosa is pink and moist. Hearing is intact to voice. LYMPH NODES:  Lymph nodes in the neck are normal. RESPIRATORY:  Lungs are clear. There is normal respiratory effort, with equal breath sounds bilaterally, and without pathologic use of accessory muscles. CARDIOVASCULAR: Heart is regular without murmurs, gallops, or rubs. GI: The abdomen is soft, mildly tender to palpation in the right upper quadrant but with a negative Murphy's sign and no evidence of peritonitis or guarding., and nondistended. There are  no palpable masses. There is no hepatosplenomegaly. There are normal bowel sounds in all quadrants. GU: Rectal deferred.   MUSCULOSKELETAL: Normal muscle strength and tone. No cyanosis or edema.   SKIN: Turgor is good and there are no pathologic skin lesions or ulcers. NEUROLOGIC: Motor and sensation is grossly normal. Cranial nerves are grossly intact. PSYCH:  Oriented to person, place and time. Affect is normal.  Data Reviewed Images and labs reviewed. Labs are primarily  within normal limits however there is a mild transaminitis with AST of 543 and an ALT of 239. Imaging of the gallbladder is an ultrasound which does show some mild gallbladder wall thickening and evidence of pericholecystic fluid that is consistent with cholecystitis. I have personally reviewed the patient's imaging, laboratory findings and medical records.    Assessment    Cholecystitis    Plan    25 year old female with acute cholecystitis. The disease pathology was described in detail to the patient and her family who all voiced understanding. I discussed the procedure of laparoscopic cholecystectomy with cholangiogram in detail.  We discussed the risks and benefits of a laparoscopic cholecystectomy and possible cholangiogram including, but not limited to bleeding, infection, injury to surrounding structures such as the intestine or liver, bile leak, retained gallstones, need to convert to an open procedure, prolonged diarrhea, blood clots such as  DVT, common bile duct injury, anesthesia risks, and possible need for additional procedures.  The likelihood of improvement in symptoms and return to the patient's normal status is good. We discussed the typical post-operative recovery course. They all voiced understanding and wished to proceed. Plan for bringing in the hospital under observation tonight with a laparoscopic cholecystectomy in the morning. Patient is accepting of this plan. We will make her nothing by mouth after midnight, start her on IV antibiotics now, maintain IV fluids and as needed medications.      Time spent with the patient was 60 minutes, with more than 50% of the time spent in face-to-face education, counseling and care coordination.     Clayburn Pert, MD FACS General Surgeon 11/14/2015, 5:42 PM

## 2015-11-14 NOTE — ED Triage Notes (Signed)
Pt sent from [facility] with c/o intermittent epigastric pain that started a couple of days ago but came back at 2am with nausea.. Denies vomiting or diarrhea.Marland Kitchen

## 2015-11-14 NOTE — ED Provider Notes (Signed)
-----------------------------------------   4:14 PM on 11/14/2015 -----------------------------------------   Blood pressure 125/62, pulse 69, temperature 98.1 F (36.7 C), temperature source Oral, resp. rate 18, height 5\' 7"  (1.702 m), weight 90.7 kg, last menstrual period 11/14/2015, SpO2 97 %, unknown if currently breastfeeding.  Assuming care from [facility], [place].  In short, Jacqueline Fields is a 25 y.o. female with a chief complaint of Abdominal Pain .  Refer to the original H&P for additional details.  Patient resented to the emergency department complaining of right upper quadrant pain. Refer to previous note for clinical progress in the emergency department. Patient had elevated LFTs as well as a positive ultrasound for cholecystitis. On-call surgeon is contacted by the previous provider for consult. Dr. Adonis Huguenin presents to the emergency department and evaluates patient. Per surgeon, patient will be admitted for gallbladder surgery in the morning. Patient care is turned over to Dr. Adonis Huguenin.    Final Impression Cholecystitis   Charline Bills Cuthriell, PA-C 11/14/15 1720    Lavonia Drafts, MD 11/14/15 [phone]

## 2015-11-15 ENCOUNTER — Encounter: Payer: Self-pay | Admitting: *Deleted

## 2015-11-15 ENCOUNTER — Observation Stay: Payer: BLUE CROSS/BLUE SHIELD

## 2015-11-15 ENCOUNTER — Observation Stay: Payer: BLUE CROSS/BLUE SHIELD | Admitting: Certified Registered Nurse Anesthetist

## 2015-11-15 ENCOUNTER — Encounter
Admission: EM | Disposition: A | Discharge status: Home / Self Care | Payer: Self-pay | Source: Home / Self Care | Attending: Emergency Medicine

## 2015-11-15 DIAGNOSIS — K819 Cholecystitis, unspecified: Secondary | ICD-10-CM | POA: Diagnosis not present

## 2015-11-15 DIAGNOSIS — K812 Acute cholecystitis with chronic cholecystitis: Secondary | ICD-10-CM | POA: Diagnosis not present

## 2015-11-15 DIAGNOSIS — K8020 Calculus of gallbladder without cholecystitis without obstruction: Secondary | ICD-10-CM | POA: Diagnosis not present

## 2015-11-15 DIAGNOSIS — K810 Acute cholecystitis: Secondary | ICD-10-CM | POA: Diagnosis not present

## 2015-11-15 HISTORY — PX: CHOLECYSTECTOMY: SHX55

## 2015-11-15 LAB — CBC
HCT: 34.7 % — ABNORMAL LOW (ref 35.0–47.0)
HEMOGLOBIN: 11.5 g/dL — AB (ref 12.0–16.0)
MCH: 25.3 pg — AB (ref 26.0–34.0)
MCHC: 33 g/dL (ref 32.0–36.0)
MCV: 76.5 fL — AB (ref 80.0–100.0)
Platelets: 445 10*3/uL — ABNORMAL HIGH (ref 150–440)
RBC: 4.54 MIL/uL (ref 3.80–5.20)
RDW: 16.2 % — ABNORMAL HIGH (ref 11.5–14.5)
WBC: 8.5 10*3/uL (ref 3.6–11.0)

## 2015-11-15 LAB — COMPREHENSIVE METABOLIC PANEL
ALT: 224 U/L — AB (ref 14–54)
ANION GAP: 7 (ref 5–15)
AST: 332 U/L — ABNORMAL HIGH (ref 15–41)
Albumin: 3.3 g/dL — ABNORMAL LOW (ref 3.5–5.0)
Alkaline Phosphatase: 126 U/L (ref 38–126)
BUN: 12 mg/dL (ref 6–20)
CHLORIDE: 108 mmol/L (ref 101–111)
CO2: 24 mmol/L (ref 22–32)
Calcium: 8.4 mg/dL — ABNORMAL LOW (ref 8.9–10.3)
Creatinine, Ser: 0.57 mg/dL (ref 0.44–1.00)
GFR calc non Af Amer: >60 mL/min (ref >60)
Glucose, Bld: 99 mg/dL (ref 65–99)
Potassium: 4.3 mmol/L (ref 3.5–5.1)
SODIUM: 139 mmol/L (ref 135–145)
Total Bilirubin: 0.5 mg/dL (ref 0.3–1.2)
Total Protein: 7.1 g/dL (ref 6.5–8.1)

## 2015-11-15 LAB — PROTIME-INR
INR: 0.94
Prothrombin Time: 12.6 seconds (ref 11.4–15.2)

## 2015-11-15 LAB — APTT: aPTT: 33 seconds (ref 24–36)

## 2015-11-15 LAB — SURGICAL PCR SCREEN
MRSA, PCR: NEGATIVE
STAPHYLOCOCCUS AUREUS: POSITIVE — AB

## 2015-11-15 SURGERY — LAPAROSCOPIC CHOLECYSTECTOMY WITH INTRAOPERATIVE CHOLANGIOGRAM
Anesthesia: General | Wound class: Clean Contaminated

## 2015-11-15 MED ORDER — LACTATED RINGERS IV SOLN
INTRAVENOUS | Status: DC
  Administered 2015-11-15 (×2): via INTRAVENOUS

## 2015-11-15 MED ORDER — PROMETHAZINE HCL 25 MG/ML IJ SOLN: 6.2500 mg | INTRAMUSCULAR | Status: DC | PRN

## 2015-11-15 MED ORDER — IOTHALAMATE MEGLUMINE 60 % INJ SOLN
INTRAMUSCULAR | Status: DC | PRN
  Administered 2015-11-15: 20 mL

## 2015-11-15 MED ORDER — SUCCINYLCHOLINE CHLORIDE 20 MG/ML IJ SOLN
INTRAMUSCULAR | Status: DC | PRN
  Administered 2015-11-15: 120 mg via INTRAVENOUS

## 2015-11-15 MED ORDER — BUPIVACAINE HCL (PF) 0.5 % IJ SOLN
INTRAMUSCULAR | Status: AC
  Filled 2015-11-15: qty 30

## 2015-11-15 MED ORDER — MUPIROCIN 2 % EX OINT
1.0000 "application " | TOPICAL_OINTMENT | Freq: Two times a day (BID) | CUTANEOUS | Status: DC
  Administered 2015-11-15: 1 via NASAL
  Filled 2015-11-15: qty 22

## 2015-11-15 MED ORDER — FENTANYL CITRATE (PF) 100 MCG/2ML IJ SOLN: 25.0000 ug | INTRAMUSCULAR | Status: DC | PRN

## 2015-11-15 MED ORDER — DEXAMETHASONE SODIUM PHOSPHATE 10 MG/ML IJ SOLN
INTRAMUSCULAR | Status: DC | PRN
  Administered 2015-11-15: 10 mg via INTRAVENOUS

## 2015-11-15 MED ORDER — ACETAMINOPHEN 10 MG/ML IV SOLN
INTRAVENOUS | Status: DC | PRN
  Administered 2015-11-15: 1000 mg via INTRAVENOUS

## 2015-11-15 MED ORDER — FENTANYL CITRATE (PF) 100 MCG/2ML IJ SOLN
INTRAMUSCULAR | Status: DC | PRN
  Administered 2015-11-15: 50 ug via INTRAVENOUS
  Administered 2015-11-15: 100 ug via INTRAVENOUS
  Administered 2015-11-15: 50 ug via INTRAVENOUS

## 2015-11-15 MED ORDER — ACETAMINOPHEN 10 MG/ML IV SOLN
INTRAVENOUS | Status: AC
  Filled 2015-11-15: qty 100

## 2015-11-15 MED ORDER — CHLORHEXIDINE GLUCONATE CLOTH 2 % EX PADS
6.0000 | MEDICATED_PAD | Freq: Every day | CUTANEOUS | Status: DC
  Administered 2015-11-15: 6 via TOPICAL

## 2015-11-15 MED ORDER — HYDROCODONE-ACETAMINOPHEN 5-325 MG PO TABS: 1.0000 | ORAL_TABLET | ORAL | 0 refills | Status: DC | PRN

## 2015-11-15 MED ORDER — HYDROCODONE-ACETAMINOPHEN 5-325 MG PO TABS: 1.0000 | ORAL_TABLET | ORAL | Status: DC | PRN

## 2015-11-15 MED ORDER — LIDOCAINE HCL (CARDIAC) 20 MG/ML IV SOLN
INTRAVENOUS | Status: DC | PRN
  Administered 2015-11-15: 100 mg via INTRAVENOUS

## 2015-11-15 MED ORDER — ROCURONIUM BROMIDE 100 MG/10ML IV SOLN
INTRAVENOUS | Status: DC | PRN
  Administered 2015-11-15: 10 mg via INTRAVENOUS
  Administered 2015-11-15: 40 mg via INTRAVENOUS

## 2015-11-15 MED ORDER — ONDANSETRON HCL 4 MG/2ML IJ SOLN
4.0000 mg | INTRAMUSCULAR | Status: DC | PRN
  Administered 2015-11-15 (×2): 4 mg via INTRAVENOUS

## 2015-11-15 MED ORDER — PROPOFOL 10 MG/ML IV BOLUS
INTRAVENOUS | Status: DC | PRN
  Administered 2015-11-15: 200 mg via INTRAVENOUS

## 2015-11-15 MED ORDER — SUGAMMADEX SODIUM 200 MG/2ML IV SOLN
INTRAVENOUS | Status: DC | PRN
  Administered 2015-11-15: 185 mg via INTRAVENOUS

## 2015-11-15 MED ORDER — LIDOCAINE HCL (PF) 1 % IJ SOLN
INTRAMUSCULAR | Status: AC
  Filled 2015-11-15: qty 30

## 2015-11-15 MED ORDER — MIDAZOLAM HCL 2 MG/2ML IJ SOLN
INTRAMUSCULAR | Status: DC | PRN
  Administered 2015-11-15: 2 mg via INTRAVENOUS

## 2015-11-15 MED ORDER — ONDANSETRON HCL 4 MG/2ML IJ SOLN
4.0000 mg | Freq: Once | INTRAMUSCULAR | Status: AC
Start: 2015-11-15 — End: 2015-11-15
  Filled 2015-11-15: qty 2

## 2015-11-15 MED ORDER — ONDANSETRON 4 MG PO TBDP: 4.0000 mg | ORAL_TABLET | Freq: Four times a day (QID) | ORAL | Status: DC | PRN

## 2015-11-15 MED ORDER — KETOROLAC TROMETHAMINE 30 MG/ML IJ SOLN
INTRAMUSCULAR | Status: DC | PRN
  Administered 2015-11-15: 30 mg via INTRAVENOUS

## 2015-11-15 SURGICAL SUPPLY — 41 items
ADHESIVE MASTISOL STRL (MISCELLANEOUS) ×2 IMPLANT
APPLIER CLIP ROT 10 11.4 M/L (STAPLE) ×2
BAG COUNTER SPONGE EZ (MISCELLANEOUS) IMPLANT
BLADE SURG SZ11 CARB STEEL (BLADE) ×2 IMPLANT
CANISTER SUCT 1200ML W/VALVE (MISCELLANEOUS) ×2 IMPLANT
CATH CHOLANG 76X19 KUMAR (CATHETERS) ×2 IMPLANT
CHLORAPREP W/TINT 26ML (MISCELLANEOUS) ×2 IMPLANT
CLIP APPLIE ROT 10 11.4 M/L (STAPLE) ×1 IMPLANT
CONRAY 60ML FOR OR (MISCELLANEOUS) ×2 IMPLANT
DEVICE PMI PUNCTURE CLOSURE (MISCELLANEOUS) ×2 IMPLANT
DRAPE SHEET LG 3/4 BI-LAMINATE (DRAPES) ×2 IMPLANT
DRESSING TELFA 4X3 1S ST N-ADH (GAUZE/BANDAGES/DRESSINGS) ×2 IMPLANT
DRSG TEGADERM 2-3/8X2-3/4 SM (GAUZE/BANDAGES/DRESSINGS) ×8 IMPLANT
ELECT REM PT RETURN 9FT ADLT (ELECTROSURGICAL) ×2
ELECTRODE REM PT RTRN 9FT ADLT (ELECTROSURGICAL) ×1 IMPLANT
GLOVE BIO SURGEON STRL SZ7.5 (GLOVE) ×10 IMPLANT
GLOVE INDICATOR 8.0 STRL GRN (GLOVE) ×2 IMPLANT
GOWN STRL REUS W/ TWL LRG LVL3 (GOWN DISPOSABLE) ×3 IMPLANT
GOWN STRL REUS W/TWL LRG LVL3 (GOWN DISPOSABLE) ×3
IRRIGATION STRYKERFLOW (MISCELLANEOUS) ×1 IMPLANT
IRRIGATOR STRYKERFLOW (MISCELLANEOUS) ×2
IV NS 1000ML (IV SOLUTION)
IV NS 1000ML BAXH (IV SOLUTION) IMPLANT
L-HOOK LAP DISP 36CM (ELECTROSURGICAL) ×2
LABEL OR SOLS (LABEL) ×2 IMPLANT
LHOOK LAP DISP 36CM (ELECTROSURGICAL) ×1 IMPLANT
NEEDLE HYPO 25X1 1.5 SAFETY (NEEDLE) ×2 IMPLANT
NEEDLE VERESS 14GA 120MM (NEEDLE) ×2 IMPLANT
NS IRRIG 500ML POUR BTL (IV SOLUTION) ×2 IMPLANT
PACK LAP CHOLECYSTECTOMY (MISCELLANEOUS) ×2 IMPLANT
PENCIL ELECTRO HAND CTR (MISCELLANEOUS) ×2 IMPLANT
POUCH ENDO CATCH 10MM SPEC (MISCELLANEOUS) ×2 IMPLANT
SCISSORS METZENBAUM CVD 33 (INSTRUMENTS) ×2 IMPLANT
SLEEVE ENDOPATH XCEL 5M (ENDOMECHANICALS) ×4 IMPLANT
STRIP CLOSURE SKIN 1/2X4 (GAUZE/BANDAGES/DRESSINGS) IMPLANT
SUT MNCRL 4-0 (SUTURE) ×1
SUT MNCRL 4-0 27XMFL (SUTURE) ×1
SUTURE MNCRL 4-0 27XMF (SUTURE) ×1 IMPLANT
TROCAR XCEL 12X100 BLDLESS (ENDOMECHANICALS) ×2 IMPLANT
TROCAR XCEL NON-BLD 5MMX100MML (ENDOMECHANICALS) ×2 IMPLANT
TUBING INSUFFLATOR HI FLOW (MISCELLANEOUS) ×2 IMPLANT

## 2015-11-15 NOTE — Anesthesia Procedure Notes (Signed)
Procedure Name: Intubation Date/Time: 11/15/2015 11:24 AM Performed by: Nelda Marseille Pre-anesthesia Checklist: Patient identified, Patient being monitored, Timeout performed, Emergency Drugs available and Suction available Patient Re-evaluated:Patient Re-evaluated prior to inductionOxygen Delivery Method: Circle System Utilized Preoxygenation: Pre-oxygenation with 100% oxygen Intubation Type: IV induction Ventilation: Mask ventilation without difficulty and Mask ventilation with difficulty Laryngoscope Size: Mac and 3 Grade View: Grade I Tube type: Oral Tube size: 7.0 mm Number of attempts: 1 Airway Equipment and Method: Stylet and Video-laryngoscopy Placement Confirmation: ETT inserted through vocal cords under direct vision,  positive ETCO2 and breath sounds checked- equal and bilateral Secured at: 21 cm Tube secured with: Tape Dental Injury: Teeth and Oropharynx as per pre-operative assessment

## 2015-11-15 NOTE — Transfer of Care (Signed)
Immediate Anesthesia Transfer of Care Note  Patient: Jacqueline Fields  Procedure(s) Performed: Procedure(s): LAPAROSCOPIC CHOLECYSTECTOMY WITH INTRAOPERATIVE CHOLANGIOGRAM (N/A)  Patient Location: PACU  Anesthesia Type:General  Level of Consciousness: sedated  Airway & Oxygen Therapy: Patient Spontanous Breathing and Patient connected to face mask oxygen  Post-op Assessment: Report given to RN and Post -op Vital signs reviewed and stable  Post vital signs: Reviewed and stable  Last Vitals:  Vitals:   11/15/15 1017 11/15/15 1241  BP: 125/71 (!) 116/58  Pulse: 77 80  Resp: 18 20  Temp: (!) 36.1 C 36.5 C    Last Pain:  Vitals:   11/15/15 1017  TempSrc: Tympanic  PainSc:       Patients Stated Pain Goal: 0 [phone]  Complications: No apparent anesthesia complications

## 2015-11-15 NOTE — Brief Op Note (Signed)
11/14/2015 - 11/15/2015  12:38 PM  PATIENT:  Jacqueline Fields  25 y.o. female  PRE-OPERATIVE DIAGNOSIS:   acute cholecystitis  POST-OPERATIVE DIAGNOSIS:   acute cholecystitis  PROCEDURE:  Procedure(s): LAPAROSCOPIC CHOLECYSTECTOMY WITH INTRAOPERATIVE CHOLANGIOGRAM (N/A)  SURGEON:  Surgeon(s) and Role:    * Clayburn Pert, MD - Primary  PHYSICIAN ASSISTANT:   ASSISTANTS: none   ANESTHESIA:   general  EBL:  Total I/O In: 1000 [I.V.:1000] Out: 5 [Blood:5]  BLOOD ADMINISTERED:none  DRAINS: none   LOCAL MEDICATIONS USED:  MARCAINE   , XYLOCAINE  and Amount: 25 ml  SPECIMEN:  Source of Specimen:  gallbladder  DISPOSITION OF SPECIMEN:  PATHOLOGY  COUNTS:  YES  TOURNIQUET:  * No tourniquets in log *  DICTATION: .Dragon Dictation  PLAN OF CARE: return to observation status  PATIENT DISPOSITION:  PACU - hemodynamically stable.   Delay start of Pharmacological VTE agent (>24hrs) due to surgical blood loss or risk of bleeding: no

## 2015-11-15 NOTE — Progress Notes (Addendum)
Pt negative for mrsa pcr. Positive for staphylococcus aureus, added standing orders. No isolation required. Dr.pabon notified and acknowledged. No new orders.

## 2015-11-15 NOTE — Progress Notes (Signed)
Patient tolerate her diet, no nausea or vomiting, discharge order received, discharge instruction provided, patient discharge home as per order

## 2015-11-15 NOTE — Discharge Summary (Signed)
Patient ID: Jacqueline Fields MRN: [id number] DOB/AGE: 1991/02/21 25 y.o.  Admit date: 11/14/2015 Discharge date: 11/15/2015  Discharge Diagnoses:  Cholecystitis  Procedures Performed: Laparoscopic cholecystectomy  Discharged Condition: good  Hospital Course: Patient brought in under observation with a diagnosis of acute cholecystitis. Underwent a laparoscopic cholecystectomy without complications. Felt better after the surgery and desired to go home.  Discharge Orders:  home  Disposition: 01-Home or Self Care  Discharge Medications:   Medication List    TAKE these medications   cetirizine 10 MG tablet Commonly known as:  ZYRTEC Take 10 mg by mouth daily.   EPIPEN 2-PAK 0.3 mg/0.3 mL Soaj injection Generic drug:  EPINEPHrine Inject 1 mg into the muscle as needed. Reported on 07/05/2015   HYDROcodone-acetaminophen 5-325 MG tablet Commonly known as:  NORCO/VICODIN Take 1-2 tablets by mouth every 4 (four) hours as needed for moderate pain or severe pain.   norethindrone 5 MG tablet Commonly known as:  AYGESTIN Take 1 tablet (5 mg total) by mouth daily.        Follwup: Follow-up [place]. Go in 1 week(s).   Specialty:  General Surgery Why:  postop Contact information: [address] [place] [phone]          Signed: Clayburn Pert 11/15/2015, 3:31 PM

## 2015-11-15 NOTE — Progress Notes (Signed)
Patient returned to unit from OR , patient lethargic, incision site in abd dressing dry no active bleeding noted, will continue to monitor.

## 2015-11-15 NOTE — Discharge Instructions (Signed)
Laparoscopic Cholecystectomy, Care After °Refer to this sheet in the next few weeks. These instructions provide you with information about caring for yourself after your procedure. Your health care provider may also give you more specific instructions. Your treatment has been planned according to current medical practices, but problems sometimes occur. Call your health care provider if you have any problems or questions after your procedure. °WHAT TO EXPECT AFTER THE PROCEDURE °After your procedure, it is common to have: °· Pain at your incision sites. You will be given pain medicines to control your pain. °· Mild nausea or vomiting. This should improve after the first 24 hours. °· Bloating and possible shoulder pain from the gas that was used during the procedure. This will improve after the first 24 hours. °HOME CARE INSTRUCTIONS °Incision Care °· Follow instructions from your health care provider about how to take care of your incisions. Make sure you: °¨ Wash your hands with soap and water before you change your bandage (dressing). If soap and water are not available, use hand sanitizer. °¨ Change your dressing as told by your health care provider. °¨ Leave stitches (sutures), skin glue, or adhesive strips in place. These skin closures may need to be in place for 2 weeks or longer. If adhesive strip edges start to loosen and curl up, you may trim the loose edges. Do not remove adhesive strips completely unless your health care provider tells you to do that. °· Do not take baths, swim, or use a hot tub until your health care provider approves. Ask your health care provider if you can take showers. You may only be allowed to take sponge baths for bathing. °General Instructions °· Take over-the-counter and prescription medicines only as told by your health care provider. °· Do not drive or operate heavy machinery while taking prescription pain medicine. °· Return to your normal diet as told by your health care  provider. °· Do not lift anything that is heavier than 10 lb (4.5 kg). °· Do not play contact sports for one week or until your health care provider approves. °SEEK MEDICAL CARE IF:  °· You have redness, swelling, or pain at the site of your incision. °· You have fluid, blood, or pus coming from your incision. °· You notice a bad smell coming from your incision area. °· Your surgical incisions break open. °· You have a fever. °SEEK IMMEDIATE MEDICAL CARE IF: °· You develop a rash. °· You have difficulty breathing. °· You have chest pain. °· You have increasing pain in your shoulders (shoulder strap areas). °· You faint or have dizzy episodes while you are standing. °· You have severe pain in your abdomen. °· You have nausea or vomiting that lasts for more than one day. °  °This information is not intended to replace advice given to you by your health care provider. Make sure you discuss any questions you have with your health care provider. °  °Document Released: 03/30/2005 Document Revised: 12/19/2014 Document Reviewed: 11/09/2012 °Elsevier Interactive Patient Education ©2016 [employer] ° °

## 2015-11-15 NOTE — Op Note (Signed)
Laparoscopic Cholecystectomy  Pre-operative Diagnosis: Acute cholecystitis  Post-operative Diagnosis: Same  Procedure: Laparoscopic cholecystectomy with cholangiogram  Surgeon: Juanda Crumble T. Adonis Huguenin, MD FACS  Anesthesia: Gen. with endotracheal tube  Assistant: None  Procedure Details  The patient was seen again in the Holding Room. The benefits, complications, treatment options, and expected outcomes were discussed with the patient. The risks of bleeding, infection, recurrence of symptoms, failure to resolve symptoms, bile duct damage, bile duct leak, retained common bile duct stone, bowel injury, any of which could require further surgery and/or ERCP, stent, or papillotomy were reviewed with the patient. The likelihood of improving the patient's symptoms with return to their baseline status is good.  The patient and/or family concurred with the proposed plan, giving informed consent.  The patient was taken to Operating Room, identified as GYDA BRICKER and the procedure verified as Laparoscopic Cholecystectomy.  A Time Out was held and the above information confirmed.  Prior to the induction of general anesthesia, antibiotic prophylaxis was administered. VTE prophylaxis was in place. General endotracheal anesthesia was then administered and tolerated well. After the induction, the abdomen was prepped with Chloraprep and draped in the sterile fashion. The patient was positioned in the supine position.  Local anesthetic  was injected into the skin near the umbilicus and an incision made. The Veress needle was placed. Pneumoperitoneum was then created with CO2 and tolerated well without any adverse changes in the patient's vital signs. A 23mm port was placed in the periumbilical position and the abdominal cavity was explored.  Two 5-mm ports were placed in the right upper quadrant and a 12 mm epigastric port was placed all under direct vision. All skin incisions  were infiltrated with a local  anesthetic agent before making the incision and placing the trocars.   The patient was positioned  in reverse Trendelenburg, tilted slightly to the patient's left.  The gallbladder was identified, the fundus grasped and retracted cephalad. Adhesions were lysed bluntly. The infundibulum was grasped and retracted laterally, exposing the peritoneum overlying the triangle of Calot. This was then divided and exposed in a blunt fashion. A critical view of the cystic duct and cystic artery was obtained.  The cystic duct was clearly identified and bluntly dissected.   A cholangiogram was attempted using the Kumar clamp and catheter. The cystic duct was clearly visualized however the common duct was not completely visualized during the cholangiogram secondary to spillage of contrast outside of the gallbladder. Attempts to remove this build contrast and repeat the cholangiogram were not successful and therefore the cholangiogram was aborted.  At this point the cystic duct and artery were serially clipped with endoclips and cut in between the endoclips. The gallbladder was taken from the gallbladder fossa in a retrograde fashion with the electrocautery. The gallbladder was removed and placed in an Endocatch bag. The liver bed was irrigated and inspected. Hemostasis was achieved with the electrocautery. Copious irrigation was utilized and was repeatedly aspirated until clear.  The gallbladder and Endocatch sac were then removed through the epigastric port site.   Inspection of the right upper quadrant was performed. No bleeding, bile duct injury or leak, or bowel injury was noted. Pneumoperitoneum was released.  And all of our ports were removed. 4-0 subcuticular Monocryl was used to close the skin. Steristrips and Mastisol and sterile dressings were  applied.  The patient was then extubated and brought to the recovery room in stable condition. Sponge, lap, and needle counts were correct at closure and at  the conclusion  of the case.   Findings: Acute Cholecystitis   Estimated Blood Loss: 20 mL         Drains: None         Specimens: Gallbladder           Complications: none               Ashon Rosenberg T. Adonis Huguenin, MD, FACS

## 2015-11-15 NOTE — Progress Notes (Signed)
Patient off the unit to OR via bed at this time

## 2015-11-15 NOTE — Anesthesia Preprocedure Evaluation (Signed)
Anesthesia Evaluation  Patient identified by MRN, date of birth, ID band Patient awake    Reviewed: Allergy & Precautions, H&P , NPO status , Patient's Chart, lab work & pertinent test results, reviewed documented beta blocker date and time   History of Anesthesia Complications Negative for: history of anesthetic complications  Airway Mallampati: II  TM Distance: >3 FB Neck ROM: full    Dental no notable dental hx. (+) Missing, Teeth Intact   Pulmonary neg shortness of breath, asthma (Exercise induced) , neg sleep apnea, neg COPD, neg recent URI,    Pulmonary exam normal breath sounds clear to auscultation       Cardiovascular Exercise Tolerance: Good negative cardio ROS Normal cardiovascular exam Rhythm:regular Rate:Normal     Neuro/Psych negative neurological ROS  negative psych ROS   GI/Hepatic negative GI ROS, Neg liver ROS,   Endo/Other  negative endocrine ROS  Renal/GU negative Renal ROS  negative genitourinary   Musculoskeletal   Abdominal   Peds  Hematology negative hematology ROS (+)   Anesthesia Other Findings Past Medical History: No date: Allergy No date: Anemia   Reproductive/Obstetrics negative OB ROS                             Anesthesia Physical Anesthesia Plan  ASA: II  Anesthesia Plan: General   Post-op Pain Management:    Induction:   Airway Management Planned:   Additional Equipment:   Intra-op Plan:   Post-operative Plan:   Informed Consent: I have reviewed the patients History and Physical, chart, labs and discussed the procedure including the risks, benefits and alternatives for the proposed anesthesia with the patient or authorized representative who has indicated his/her understanding and acceptance.   Dental Advisory Given  Plan Discussed with: Anesthesiologist, CRNA and Surgeon  Anesthesia Plan Comments:         Anesthesia Quick  Evaluation

## 2015-11-15 NOTE — Progress Notes (Signed)
CC: Cholecystitis Subjective: Patient reports that her pain has subsided some but not gone away completely. She continues to have nausea. Otherwise no acute complaints morning.  Objective: Vital signs in last 24 hours: Temp:  [98 F (36.7 C)-98.4 F (36.9 C)] 98.3 F (36.8 C) (08/04 0457) Pulse Rate:  [68-83] 82 (08/04 0457) Resp:  [18-20] 20 (08/04 0457) BP: (112-131)/(54-79) 131/78 (08/04 0457) SpO2:  [97 %-100 %] 99 % (08/04 0457) Weight:  [90.7 kg (200 lb)] 90.7 kg (200 lb) (08/03 1147) Last BM Date: 11/12/15  Intake/Output from previous day: 08/03 0701 - 08/04 0700 In: -  Out: 600 [Urine:600] Intake/Output this shift: No intake/output data recorded.  Physical exam:  Gen.: No acute distress Chest: Clear to auscultation Heart: Regular rhythm Abdomen: Soft, minimally tender patient right upper quadrant, nondistended.  Lab Results: CBC   Recent Labs  11/14/15 1150 11/15/15 0554  WBC 7.3 8.5  HGB 12.2 11.5*  HCT 36.0 34.7*  PLT 525* 445*   BMET  Recent Labs  11/14/15 1150 11/15/15 0554  NA 134* 139  K 4.5 4.3  CL 103 108  CO2 23 24  GLUCOSE 94 99  BUN 9 12  CREATININE 0.64 0.57  CALCIUM 9.4 8.4*   PT/INR  Recent Labs  11/15/15 0554  LABPROT 12.6  INR 0.94   ABG No results for input(s): PHART, HCO3 in the last 72 hours.  Invalid input(s): PCO2, PO2  Studies/Results: US Abdomen Limited Ruq  Result Date: 11/14/2015 CLINICAL DATA:  Right upper quadrant abdominal pain for 1 day. EXAM: US ABDOMEN LIMITED - RIGHT UPPER QUADRANT COMPARISON:  Ultrasound, 07/04/2010 FINDINGS: Gallbladder: Multiple dependent gallstones. Wall measures between 4-5 mm thickness with some slight pericholecystic fluid. Patient is tender to transducer pressure over the gallbladder. Common bile duct: Diameter: 5.2 mm, no convincing duct stone. Liver: Mild increased parenchymal echogenicity.  No mass or focal lesion. IMPRESSION: 1. Cholelithiasis with sonographic evidence of  early acute cholecystitis with mild wall thickening slight pericholecystic fluid and a positive sonographic Murphy's sign. Electronically Signed   By: Lajean Manes M.D.   On: 11/14/2015 13:48    Anti-infectives: Anti-infectives    Start     Dose/Rate Route Frequency Ordered Stop   11/14/15 1815  cefTRIAXone (ROCEPHIN) 2 g in dextrose 5 % 50 mL IVPB     2 g 100 mL/hr over 30 Minutes Intravenous Every 24 hours 11/14/15 1804        Assessment/Plan:  25 year old female with acute cholecystitis. Plan to take the operating room later today. All questions answered to the patient's satisfaction. If she does well from an operative standpoint possible discharge home later today.  Cassius Cullinane T. Adonis Huguenin, MD, FACS  11/15/2015

## 2015-11-16 NOTE — Anesthesia Postprocedure Evaluation (Signed)
Anesthesia Post Note  Patient: Arlenis Hougham Mccanless  Procedure(s) Performed: Procedure(s) (LRB): LAPAROSCOPIC CHOLECYSTECTOMY WITH INTRAOPERATIVE CHOLANGIOGRAM (N/A)  Patient location during evaluation: PACU Anesthesia Type: General Level of consciousness: awake and alert Pain management: pain level controlled Vital Signs Assessment: post-procedure vital signs reviewed and stable Respiratory status: spontaneous breathing, nonlabored ventilation, respiratory function stable and patient connected to nasal cannula oxygen Cardiovascular status: blood pressure returned to baseline and stable Postop Assessment: no signs of nausea or vomiting Anesthetic complications: no    Last Vitals:  Vitals:   11/15/15 1322 11/15/15 1336  BP: 128/79 133/75  Pulse: 74 78  Resp: 16 16  Temp: 36.7 C 36.8 C    Last Pain:  Vitals:   11/15/15 1427  TempSrc:   PainSc: Asleep                 Martha Clan

## 2015-11-18 ENCOUNTER — Telehealth: Payer: Self-pay | Admitting: General Surgery

## 2015-11-18 LAB — SURGICAL PATHOLOGY

## 2015-11-18 NOTE — Telephone Encounter (Signed)
Patient has [place] with Dr Adonis Huguenin on 8/4. She has been vomiting since 1am this morning. Please call and advise.

## 2015-11-18 NOTE — Telephone Encounter (Signed)
I spoke with patient and discussed constipation protocol. Patient was instructed to hydrate and increase fiber in diet. And to try and walk as this will help to get her bowels moving.  Patient was instructed to call if she continues to have vomiting after her bowels move, otherwise we will see her at her scheduled  post op appointment on 11/22/15 with Dr.Woodham. Patient verbalized understanding.

## 2015-11-20 MED ORDER — ONDANSETRON 4 MG PO TBDP
4.0000 mg | ORAL_TABLET | Freq: Three times a day (TID) | ORAL | 0 refills | Status: DC | PRN
Start: 2015-11-20 — End: 2015-11-22

## 2015-11-20 NOTE — Telephone Encounter (Signed)
Patient is calling again. She has not had a bowel movement since Friday 11/15/15 and she is now vomiting. Please call and advise.

## 2015-11-20 NOTE — Telephone Encounter (Signed)
Returned phone call to patient at this time. She states that she has done 1 dose of Miralax 17g and 2 tablets of Dulcolax 5 mg each without a bowel movement. She has now developed vomiting when trying to eat a full meal. Denies abdominal pain but states that she is bloated. She would like something for nausea to help until she can have a bowel movement. She uses [address].   I explained to patient that she will need to do 2 fleets enemas back to back to get bowels moving from the bottom. Then take another dose of Dulcolax 10mg . If still no bowel movement, then she is to call. Patient has stopped taking Norco per suggestion of previous nurse that she spoke with but is still having pain. Encouraged patient to use Ibuprofen or Tylenol if needed for pain.   Zofran has been sent to pharmacy at this time. Will await return phone call from patient if she still continues to have problem with constipation.

## 2015-11-22 ENCOUNTER — Encounter: Payer: Self-pay | Admitting: General Surgery

## 2015-11-22 ENCOUNTER — Ambulatory Visit (INDEPENDENT_AMBULATORY_CARE_PROVIDER_SITE_OTHER): Payer: BLUE CROSS/BLUE SHIELD | Admitting: General Surgery

## 2015-11-22 ENCOUNTER — Telehealth: Payer: Self-pay | Admitting: General Surgery

## 2015-11-22 VITALS — BP 121/87 | HR 120 | Temp 98.7°F | Wt 203.0 lb

## 2015-11-22 DIAGNOSIS — Z4889 Encounter for other specified surgical aftercare: Secondary | ICD-10-CM

## 2015-11-22 NOTE — Patient Instructions (Signed)

## 2015-11-22 NOTE — Telephone Encounter (Signed)
Patient's disability form was filled out and faxed. 

## 2015-11-22 NOTE — Progress Notes (Signed)
Outpatient Surgical Follow Up  11/22/2015  Jacqueline Fields is an 25 y.o. female.   Chief Complaint  Patient presents with  . Routine Post Op    LaparoscopicCholecystectomy 11/15/2015 Dr. Adonis Huguenin    HPI: 25 year old female returns to clinic 1 week status post laparoscopic cholecystectomy. Patient reports doing better. She denies any abdominal pain. She was having nausea and vomiting but none in the last 2 days. She currently denies any fevers, chills, chest pain, short of breath, diarrhea, constipation, nausea, vomiting. She's been tolerating a diet and is no longer taking pain medications.  Past Medical History:  Diagnosis Date  . Allergy   . Anemia     Past Surgical History:  Procedure Laterality Date  . ADENOIDECTOMY    . CHOLECYSTECTOMY N/A 11/15/2015   Procedure: LAPAROSCOPIC CHOLECYSTECTOMY WITH INTRAOPERATIVE CHOLANGIOGRAM;  Surgeon: Clayburn Pert, MD;  Location: [facility] ORS;  Service: General;  Laterality: N/A;    Family History  Problem Relation Age of Onset  . Diabetes Paternal Grandmother     Social History:  reports that she has never smoked. She has never used smokeless tobacco. She reports that she does not drink alcohol or use drugs.  Allergies: No Known Allergies  Medications reviewed.    ROS A multipoint review of systems was completed, all pertinent positives and negatives are documented within the history of present illness remainder negative.   BP 121/87 (BP Location: Left Arm, Patient Position: Sitting, Cuff Size: Normal)   Pulse (!) 120   Temp 98.7 F (37.1 C) (Oral)   Wt 92.1 kg (203 lb)   LMP 11/12/2015   BMI 31.79 kg/m   Physical Exam Gen.: No acute distress Chest: Clear to auscultation Heart: Regular rhythm Abdomen: Soft, appropriately tender to palpation at incision sites, nondistended. Laparoscopic incision sites are all well approximated without any evidence of erythema or drainage. There is some mild ecchymosis to her upper midline  periumbilical incision sites.    No results found for this or any previous visit (from the past 48 hour(s)). No results found.  Assessment/Plan:  1. Aftercare following surgery 25 year old female status post laparoscopic cholecystectomy. Doing well. Provided with standard postoperative precautions for activities and wound care. Pathology reviewed with the patient. All questions answered to her satisfaction. She'll follow-up in clinic on an as-needed basis.     Clayburn Pert, MD FACS General Surgeon  11/22/2015,3:14 PM

## 2015-12-25 ENCOUNTER — Telehealth: Payer: Self-pay

## 2015-12-25 NOTE — Telephone Encounter (Signed)
Disability Form was filled out and faxed.

## 2016-02-07 DIAGNOSIS — Z23 Encounter for immunization: Secondary | ICD-10-CM | POA: Diagnosis not present

## 2016-09-06 ENCOUNTER — Other Ambulatory Visit: Payer: Self-pay | Admitting: Obstetrics and Gynecology

## 2016-09-09 ENCOUNTER — Encounter: Payer: Self-pay | Admitting: Obstetrics and Gynecology

## 2016-09-09 ENCOUNTER — Ambulatory Visit (INDEPENDENT_AMBULATORY_CARE_PROVIDER_SITE_OTHER): Payer: BLUE CROSS/BLUE SHIELD | Admitting: Obstetrics and Gynecology

## 2016-09-09 VITALS — BP 114/70 | Ht 68.0 in | Wt 220.0 lb

## 2016-09-09 DIAGNOSIS — Z3041 Encounter for surveillance of contraceptive pills: Secondary | ICD-10-CM

## 2016-09-09 DIAGNOSIS — Z1389 Encounter for screening for other disorder: Secondary | ICD-10-CM

## 2016-09-09 DIAGNOSIS — Z1339 Encounter for screening examination for other mental health and behavioral disorders: Secondary | ICD-10-CM

## 2016-09-09 DIAGNOSIS — Z1331 Encounter for screening for depression: Secondary | ICD-10-CM

## 2016-09-09 DIAGNOSIS — Z124 Encounter for screening for malignant neoplasm of cervix: Secondary | ICD-10-CM | POA: Diagnosis not present

## 2016-09-09 DIAGNOSIS — Z01419 Encounter for gynecological examination (general) (routine) without abnormal findings: Secondary | ICD-10-CM

## 2016-09-09 DIAGNOSIS — Z113 Encounter for screening for infections with a predominantly sexual mode of transmission: Secondary | ICD-10-CM

## 2016-09-09 MED ORDER — NORGESTIMATE-ETH ESTRADIOL 0.25-35 MG-MCG PO TABS: 1.0000 | ORAL_TABLET | ORAL | 4 refills | Status: DC

## 2016-09-09 NOTE — Progress Notes (Signed)
Gynecology Annual Exam  PCP: Lucille Passy, MD  Chief Complaint  Patient presents with  . Annual Exam    History of Present Illness:  Jacqueline Fields is a 25 y.o. G2P2001 who LMP was Patient's last menstrual period was 09/09/2016., presents today for her annual examination.  Her menses are regular every 28-30 days, lasting 4 day(s).  Dysmenorrhea none. She does not have intermenstrual bleeding.  She is single partner, contraception - OCP (estrogen/progesterone).  Last Pap: 3 years ago  Results were: no abnormalities /neg HPV DNA not done Hx of STDs: none  Last mammogram: n/a  There is no FH of breast cancer. There is no FH of ovarian cancer. The patient does not do self-breast exams.  Tobacco use: The patient denies current or previous tobacco use. Alcohol use: social drinker Exercise: not active  The patient wears seatbelts: yes.     Review of Systems: Review of Systems  Constitutional: Negative.   HENT: Negative.   Eyes: Negative.   Respiratory: Negative.   Cardiovascular: Negative.   Gastrointestinal: Negative.   Genitourinary: Negative.   Musculoskeletal: Negative.   Skin: Negative.   Neurological: Negative.   Psychiatric/Behavioral: Negative.     Past Medical History:  Past Medical History:  Diagnosis Date  . Allergy   . Anemia     Past Surgical History:  Past Surgical History:  Procedure Laterality Date  . ADENOIDECTOMY    . CHOLECYSTECTOMY N/A 11/15/2015   Procedure: LAPAROSCOPIC CHOLECYSTECTOMY WITH INTRAOPERATIVE CHOLANGIOGRAM;  Surgeon: Clayburn Pert, MD;  Location: [facility] ORS;  Service: General;  Laterality: N/A;    Medications: Prior to Admission medications   Medication Sig Start Date End Date Taking? Authorizing Provider  cetirizine (ZYRTEC) 10 MG tablet Take 10 mg by mouth daily.   Yes [provider]  EPIPEN 2-PAK 0.3 MG/0.3ML SOAJ injection Inject 1 mg into the muscle as needed. Reported on 07/05/2015 09/01/12  Yes [provider]  norgestimate-ethinyl estradiol (ORTHO-CYCLEN,SPRINTEC,PREVIFEM) 0.25-35 MG-MCG tablet Take 1 tablet by mouth 1 day or 1 dose. 07/02/11  Yes [provider]    Allergies:  No Known Allergies  Gynecologic History: Patient's last menstrual period was 09/09/2016. History of abnormal pap smear: No History of STI: No   Obstetric History: G2P2001  Social History:  Social History   Social History  . Marital status: Single    Spouse name: N/A  . Number of children: N/A  . Years of education: N/A   Occupational History  . Not on file.   Social History Main Topics  . Smoking status: Never Smoker  . Smokeless tobacco: Never Used  . Alcohol use No  . Drug use: No  . Sexual activity: Not Currently    Birth control/ protection: Pill   Other Topics Concern  . Not on file   Social History Narrative  . No narrative on file    Family History:  Family History  Problem Relation Age of Onset  . Diabetes Paternal Grandmother     Physical Exam BP 114/70   Ht 5\' 8"  (1.727 m)   Wt 220 lb (99.8 kg)   LMP 09/09/2016   BMI 33.45 kg/m   Physical Exam  Constitutional: She is oriented to person, place, and time. She appears well-developed and well-nourished. No distress.  Genitourinary: Vagina normal and uterus normal. Pelvic exam was performed with patient supine. There is no rash, tenderness, lesion or injury on the right labia. There is no rash, tenderness, lesion or injury on  the left labia. Vagina exhibits no lesion. No erythema in the vagina. No signs of injury around the vagina. Right adnexum does not display mass, does not display tenderness and does not display fullness. Left adnexum does not display tenderness and does not display fullness. Cervix does not exhibit motion tenderness, lesion or discharge.   Uterus is mobile and anteverted. Uterus is not tender or exhibiting a mass.  HENT:  Head: Normocephalic and atraumatic.  Eyes: EOM are normal. No  scleral icterus.  Neck: Normal range of motion. Neck supple. No thyromegaly present.  Cardiovascular: Normal rate, regular rhythm and normal heart sounds.   Pulmonary/Chest: Effort normal and breath sounds normal. No respiratory distress. She has no wheezes. She has no rales.  Abdominal: Soft. Bowel sounds are normal. She exhibits no distension and no mass. There is no tenderness. There is no rebound and no guarding.  Musculoskeletal: Normal range of motion. She exhibits no edema.  Lymphadenopathy:    She has no cervical adenopathy.  Neurological: She is alert and oriented to person, place, and time. No cranial nerve deficit.  Skin: Skin is warm and dry. No erythema.  Psychiatric: She has a normal mood and affect. Her behavior is normal. Judgment normal.   Female chaperone present for pelvic and breast  portions of the physical exam  Results: AUDIT Questionnaire (screen for alcoholism): 3 PHQ-9: 0   Assessment: 26 y.o. G2P2001 routine annual gynecologic exam, doing well.    Plan:  Screening: -- Blood pressure screen normal. -- Colonoscopy - not due -- Mammogram - not due.  -- Depression screening negative (PHQ-9) -- Nutrition: normal -- cholesterol screening: n/a -- osteoporosis screening: n/a -- tobacco screening: not using -- alcohol screening: AUDIT questionnaire indicates low-risk usage. -- family history of breast cancer screening: done. not at high risk. -- no evidence of domestic violence or intimate partner violence. -- STD screening: gonorrhea/chlamydia NAAT collected. -- pap smear collected.  REfill on contraception. Having no issues.   Jacqueline Docker, MD 09/09/2016 2:53 PM

## 2016-09-13 LAB — IGP, CTNG, RFX APTIMA HPV ASCU
CHLAMYDIA, NUC. ACID AMP: NEGATIVE
Gonococcus by Nucleic Acid Amp: NEGATIVE
PAP SMEAR COMMENT: 0

## 2016-09-17 ENCOUNTER — Encounter: Payer: Self-pay | Admitting: Obstetrics and Gynecology

## 2016-12-10 DIAGNOSIS — H1013 Acute atopic conjunctivitis, bilateral: Secondary | ICD-10-CM | POA: Diagnosis not present

## 2017-02-05 DIAGNOSIS — Z23 Encounter for immunization: Secondary | ICD-10-CM | POA: Diagnosis not present

## 2017-04-14 ENCOUNTER — Ambulatory Visit: Payer: BLUE CROSS/BLUE SHIELD | Admitting: Obstetrics and Gynecology

## 2017-04-14 ENCOUNTER — Encounter: Payer: Self-pay | Admitting: Obstetrics and Gynecology

## 2017-04-14 VITALS — BP 118/74 | Ht 68.0 in | Wt 210.0 lb

## 2017-04-14 DIAGNOSIS — N914 Secondary oligomenorrhea: Secondary | ICD-10-CM

## 2017-04-14 LAB — POCT URINE PREGNANCY: PREG TEST UR: NEGATIVE

## 2017-04-14 NOTE — Progress Notes (Signed)
Obstetrics & Gynecology Office Visit   Chief Complaint  Patient presents with  . Menstrual Problem   History of Present Illness: 27 y.o. G56P2001 female who was having regular menses every month, coming every 28 days, lasting 4 days. However, in November and December she only had light bleeding, mostly spotting, that lasted 24-48 hours. She only had bleeding when she would go to the bathroom to urinate.  She also notes a decrease in weight of 15-20 pounds.  She notes decreased appetite and early satiety. She has vomited when she has forced herself to eat.  She notes diarrhea, but that is unchanged since her cholecystectomy in late 2017.  Her last pap smear was in 08/2015 and was normal.  She took a pregnancy test before christmas that was negative.  She continues to take her combined OCPs and these are regular OCPs (estradiol is 35 mcg).  Denies problems with intercourse, any new sexual partners, vaginal symptoms.  Denies abdominal pain, but does have random cramps.  She does not have period cramps. Denies headaches, visual changes, galactorrhea, skin and hair changes, trembling of her hands, rapid heart rate.  She denies new social stressors.   Past Medical History:  Diagnosis Date  . Allergy   . Anemia     Past Surgical History:  Procedure Laterality Date  . ADENOIDECTOMY    . CHOLECYSTECTOMY N/A 11/15/2015   Procedure: LAPAROSCOPIC CHOLECYSTECTOMY WITH INTRAOPERATIVE CHOLANGIOGRAM;  Surgeon: Clayburn Pert, MD;  Location: [facility] OR[facility];  Service: General;  Laterality: N/A;    Gynecologic History: Patient's last menstrual period was 03/31/2017.  Obstetric History: G2P2002, s/p SVD x 2  Family History  Problem Relation Age of Onset  . Diabetes Paternal Grandmother     Social History   Socioeconomic History  . Marital status: Single    Spouse name: Not on file  . Number of children: Not on file  . Years of education: Not on file  . Highest education level: Not on file  Social Needs    . Financial resource strain: Not on file  . Food insecurity - worry: Not on file  . Food insecurity - inability: Not on file  . Transportation needs - medical: Not on file  . Transportation needs - non-medical: Not on file  Occupational History  . Not on file  Tobacco Use  . Smoking status: Never Smoker  . Smokeless tobacco: Never Used  Substance and Sexual Activity  . Alcohol use: No  . Drug use: No  . Sexual activity: Not Currently    Birth control/protection: Pill  Other Topics Concern  . Not on file  Social History Narrative  . Not on file   Allergies: No Known Allergies  Medications   Medication Sig Start Date End Date Taking? Authorizing Provider  cetirizine (ZYRTEC) 10 MG tablet Take 10 mg by mouth daily.   Yes [provider]  EPIPEN 2-PAK 0.3 MG/0.3ML SOAJ injection Inject 1 mg into the muscle as needed. Reported on 07/05/2015 09/01/12  Yes [provider]  norgestimate-ethinyl estradiol (ORTHO-CYCLEN,SPRINTEC,PREVIFEM) 0.25-35 MG-MCG tablet Take 1 tablet by mouth 1 day or 1 dose. 09/09/16  Yes Will Bonnet, MD    Review of Systems  Constitutional: Positive for weight loss. Negative for chills, diaphoresis, fever and malaise/fatigue.  HENT: Negative.   Eyes: Negative.  Negative for blurred vision and double vision.  Respiratory: Negative.   Cardiovascular: Negative.  Negative for chest pain, palpitations and leg swelling.  Gastrointestinal: Positive for diarrhea, nausea and vomiting.  Negative for abdominal pain, blood in stool, constipation, heartburn and melena.  Genitourinary: Negative.   Musculoskeletal: Negative.   Skin: Negative.   Neurological: Negative.  Negative for tremors, focal weakness, weakness and headaches.  Endo/Heme/Allergies: Negative.   Psychiatric/Behavioral: Negative.      Physical Exam BP 118/74   Ht 5\' 8"  (1.727 m)   Wt 210 lb (95.3 kg)   LMP 03/31/2017   BMI 31.93 kg/m  Patient's last menstrual period was  03/31/2017. Physical Exam  Constitutional: She is oriented to person, place, and time. She appears well-developed and well-nourished. No distress.  Genitourinary: Vagina normal and uterus normal. Pelvic exam was performed with patient supine. There is no rash, tenderness or lesion on the right labia. There is no rash, tenderness or lesion on the left labia. Right adnexum does not display mass, does not display tenderness and does not display fullness.  Left adnexum displays tenderness (mild ttp L>R). Left adnexum does not display mass and does not display fullness. Cervix does not exhibit motion tenderness, lesion or polyp.   Uterus is mobile. Uterus is not tender or exhibiting a mass.  HENT:  Head: Normocephalic and atraumatic.  Eyes: EOM are normal. Pupils are equal, round, and reactive to light. No scleral icterus.  Visual fields full to confrontation  Neck: Normal range of motion. Neck supple. No thyromegaly present.  Cardiovascular: Normal rate and regular rhythm. Exam reveals no gallop and no friction rub.  No murmur heard. Pulmonary/Chest: Effort normal and breath sounds normal. No respiratory distress. She has no wheezes. She has no rales.  Abdominal: Soft. Bowel sounds are normal. She exhibits no distension and no mass. There is tenderness (mild RLQ ttp). There is no rebound and no guarding.  Musculoskeletal: Normal range of motion. She exhibits no edema.  Lymphadenopathy:    She has no cervical adenopathy.  Neurological: She is alert and oriented to person, place, and time. No cranial nerve deficit.  Skin: Skin is warm and dry. No erythema.  Psychiatric: She has a normal mood and affect. Her behavior is normal. Judgment normal.   Female chaperone present for pelvic and breast  portions of the physical exam  Urine Pregnancy Test: negative  Assessment: 26 y.o. G74P2001 female here for  1. Secondary oligomenorrhea      Plan: Problem List Items Addressed This Visit    None      Visit Diagnoses    Secondary oligomenorrhea    -  Primary   Relevant Orders   17-Hydroxyprogesterone   Beta HCG, Quant   DHEA-sulfate   Follicle stimulating hormone   Prolactin   Testosterone,Free and Total   TSH + free T4   US PELVIS TRANSVANGINAL NON-OB (TV ONLY)   POCT urine pregnancy (Completed)     This is a new problem for which an extensive workup is planned as this could represent a serious condition ranging from pituitary adenoma to ovarian cyst.  Will obtain lab work today to rule out multiple organs which could affect her menses.  Plan for ultrasound to assess uterus.    Prentice Docker, MD 04/14/2017 6:13 PM

## 2017-04-16 LAB — 17-HYDROXYPROGESTERONE: 17-Hydroxyprogesterone: <10 ng/dL

## 2017-04-16 LAB — BETA HCG QUANT (REF LAB): hCG Quant: <1 m[IU]/mL

## 2017-04-16 LAB — DHEA-SULFATE: DHEA-SO4: 102.3 ug/dL (ref 84.8–378.0)

## 2017-04-16 LAB — TESTOSTERONE,FREE AND TOTAL: Testosterone: <3 ng/dL — ABNORMAL LOW (ref 8–48)

## 2017-04-16 LAB — TSH+FREE T4
FREE T4: 1.18 ng/dL (ref 0.82–1.77)
TSH: 1.1 u[IU]/mL (ref 0.450–4.500)

## 2017-04-16 LAB — PROLACTIN: PROLACTIN: 5.2 ng/mL (ref 4.8–23.3)

## 2017-04-16 LAB — FOLLICLE STIMULATING HORMONE: FSH: 2.9 m[IU]/mL

## 2017-04-18 LAB — COMPREHENSIVE METABOLIC PANEL
A/G RATIO: 1.4 (ref 1.2–2.2)
ALK PHOS: 72 IU/L (ref 39–117)
ALT: 9 IU/L (ref 0–32)
AST: 12 IU/L (ref 0–40)
Albumin: 4.2 g/dL (ref 3.5–5.5)
BUN / CREAT RATIO: 7 — AB (ref 9–23)
BUN: 5 mg/dL — AB (ref 6–20)
CHLORIDE: 99 mmol/L (ref 96–106)
CO2: 19 mmol/L — ABNORMAL LOW (ref 20–29)
Calcium: 9.6 mg/dL (ref 8.7–10.2)
Creatinine, Ser: 0.71 mg/dL (ref 0.57–1.00)
GFR calc Af Amer: 137 mL/min/{1.73_m2} (ref >59)
GFR calc non Af Amer: 119 mL/min/{1.73_m2} (ref >59)
Globulin, Total: 3.1 g/dL (ref 1.5–4.5)
Glucose: 89 mg/dL (ref 65–99)
POTASSIUM: 4.3 mmol/L (ref 3.5–5.2)
Sodium: 137 mmol/L (ref 134–144)
Total Protein: 7.3 g/dL (ref 6.0–8.5)

## 2017-04-18 LAB — SPECIMEN STATUS REPORT

## 2017-04-22 ENCOUNTER — Encounter: Payer: Self-pay | Admitting: Obstetrics and Gynecology

## 2017-04-22 ENCOUNTER — Ambulatory Visit (INDEPENDENT_AMBULATORY_CARE_PROVIDER_SITE_OTHER): Payer: BLUE CROSS/BLUE SHIELD

## 2017-04-22 ENCOUNTER — Ambulatory Visit (INDEPENDENT_AMBULATORY_CARE_PROVIDER_SITE_OTHER): Payer: BLUE CROSS/BLUE SHIELD | Admitting: Obstetrics and Gynecology

## 2017-04-22 VITALS — BP 118/74 | Ht 67.0 in | Wt 210.0 lb

## 2017-04-22 DIAGNOSIS — R6881 Early satiety: Secondary | ICD-10-CM | POA: Diagnosis not present

## 2017-04-22 DIAGNOSIS — N914 Secondary oligomenorrhea: Secondary | ICD-10-CM | POA: Diagnosis not present

## 2017-04-22 DIAGNOSIS — R634 Abnormal weight loss: Secondary | ICD-10-CM | POA: Diagnosis not present

## 2017-04-22 NOTE — Progress Notes (Signed)
Gynecology Ultrasound Follow Up   Chief Complaint  Patient presents with  . Follow-up  secondary oligomenorrhea  History of Present Illness: Patient is a 27 y.o. female who presents today for ultrasound evaluation of the above .  I have personally reviewed the images and report for this ultrasound and my interpretation is reflected below.  Ultrasound demonstrates the following findings Adnexa: no masses seen  Uterus: anteverted with endometrial stripe  1.4 mm Additional: no abnormalities  Past Medical History:  Diagnosis Date  . Allergy   . Anemia     Past Surgical History:  Procedure Laterality Date  . ADENOIDECTOMY    . CHOLECYSTECTOMY N/A 11/15/2015   Procedure: LAPAROSCOPIC CHOLECYSTECTOMY WITH INTRAOPERATIVE CHOLANGIOGRAM;  Surgeon: Clayburn Pert, MD;  Location: [facility] ORS;  Service: General;  Laterality: N/A;    Family History  Problem Relation Age of Onset  . Diabetes Paternal Grandmother     Social History   Socioeconomic History  . Marital status: Single    Spouse name: Not on file  . Number of children: Not on file  . Years of education: Not on file  . Highest education level: Not on file  Social Needs  . Financial resource strain: Not on file  . Food insecurity - worry: Not on file  . Food insecurity - inability: Not on file  . Transportation needs - medical: Not on file  . Transportation needs - non-medical: Not on file  Occupational History  . Not on file  Tobacco Use  . Smoking status: Never Smoker  . Smokeless tobacco: Never Used  Substance and Sexual Activity  . Alcohol use: No  . Drug use: No  . Sexual activity: Not Currently    Birth control/protection: Pill  Other Topics Concern  . Not on file  Social History Narrative  . Not on file    No Known Allergies  Prior to Admission medications   Medication Sig Start Date End Date Taking? Authorizing Provider  cetirizine (ZYRTEC) 10 MG tablet Take 10 mg by mouth daily.    [provider]  EPIPEN 2-PAK 0.3 MG/0.3ML SOAJ injection Inject 1 mg into the muscle as needed. Reported on 07/05/2015 09/01/12   [provider]  norgestimate-ethinyl estradiol (ORTHO-CYCLEN,SPRINTEC,PREVIFEM) 0.25-35 MG-MCG tablet Take 1 tablet by mouth 1 day or 1 dose. 09/09/16   Will Bonnet, MD    Physical Exam BP 118/74   Ht 5\' 7"  (1.702 m)   Wt 210 lb (95.3 kg)   LMP 03/31/2017   BMI 32.89 kg/m    General: NAD HEENT: normocephalic, anicteric Pulmonary: No increased work of breathing Extremities: no edema, erythema, or tenderness Neurologic: Grossly intact, normal gait Psychiatric: mood appropriate, affect full   Assessment: 27 y.o. G2P2001 with  1. Secondary oligomenorrhea   2. Early satiety   3. Weight loss     Plan: Problem List Items Addressed This Visit    None    Visit Diagnoses    Secondary oligomenorrhea    -  Primary   Relevant Orders   CBC with Differential/Platelet   Early satiety       Relevant Orders   Ambulatory referral to Gastroenterology   Weight loss       Relevant Orders   Ambulatory referral to Gastroenterology     She states that she is just starting the pills in her pill pack that would bring about a menses (she just finished the 21 days of active pills) and her endometrial stripe is 1.4  mm.  Her endocrine labs are otherwise normal. Will get a CBC today. Given her other symptoms of early satiety, weight loss, bloating, etc., am concerned for possible malabsorption syndrome.   So, will refer her to GI.  Other etiologies on the differential include neoplasia. However, I have no specific target.   I have given her a birth control patch trial to see if circumventing her GI system will allow her to begin having menses again.   CBC today to ensure no abnormalities of bone marrow cell line production.   Next steps based on evaluation from GI.  25 minutes spent in face to face discussion with > 50% spent in counseling, management, and  coordination of care of her secondary oligomenorrhea, early satiety, weight loss.   Prentice Docker, MD 04/22/2017 6:02 PM

## 2017-04-23 LAB — CBC WITH DIFFERENTIAL/PLATELET
BASOS: 1 %
Basophils Absolute: 0.1 10*3/uL (ref 0.0–0.2)
EOS (ABSOLUTE): 0.4 10*3/uL (ref 0.0–0.4)
EOS: 3 %
HEMATOCRIT: 37.2 % (ref 34.0–46.6)
HEMOGLOBIN: 11.9 g/dL (ref 11.1–15.9)
IMMATURE GRANS (ABS): 0 10*3/uL (ref 0.0–0.1)
IMMATURE GRANULOCYTES: 0 %
Lymphocytes Absolute: 3.1 10*3/uL (ref 0.7–3.1)
Lymphs: 25 %
MCH: 25.9 pg — ABNORMAL LOW (ref 26.6–33.0)
MCHC: 32 g/dL (ref 31.5–35.7)
MCV: 81 fL (ref 79–97)
MONOCYTES: 5 %
MONOS ABS: 0.6 10*3/uL (ref 0.1–0.9)
NEUTROS PCT: 66 %
Neutrophils Absolute: 7.9 10*3/uL — ABNORMAL HIGH (ref 1.4–7.0)
Platelets: 544 10*3/uL — ABNORMAL HIGH (ref 150–379)
RBC: 4.59 x10E6/uL (ref 3.77–5.28)
RDW: 14.5 % (ref 12.3–15.4)
WBC: 12 10*3/uL — AB (ref 3.4–10.8)

## 2017-04-29 ENCOUNTER — Encounter: Payer: Self-pay | Admitting: Gastroenterology

## 2017-04-29 ENCOUNTER — Ambulatory Visit: Payer: Managed Care, Other (non HMO) | Admitting: Gastroenterology

## 2017-04-29 ENCOUNTER — Other Ambulatory Visit
Admission: RE | Admit: 2017-04-29 | Discharge: 2017-04-29 | Disposition: A | Discharge status: Home / Self Care | Payer: BLUE CROSS/BLUE SHIELD | Source: Ambulatory Visit | Attending: Gastroenterology | Admitting: Gastroenterology

## 2017-04-29 VITALS — BP 138/83 | HR 82 | Temp 98.4°F | Ht 67.0 in | Wt 209.2 lb

## 2017-04-29 DIAGNOSIS — R6881 Early satiety: Secondary | ICD-10-CM

## 2017-04-29 DIAGNOSIS — R634 Abnormal weight loss: Secondary | ICD-10-CM

## 2017-04-29 DIAGNOSIS — D509 Iron deficiency anemia, unspecified: Secondary | ICD-10-CM | POA: Insufficient documentation

## 2017-04-29 LAB — VITAMIN B12: Vitamin B-12: 81 pg/mL — ABNORMAL LOW (ref 180–914)

## 2017-04-29 LAB — IRON AND TIBC
Iron: 55 ug/dL (ref 28–170)
Saturation Ratios: 12 % (ref 10.4–31.8)
TIBC: 471 ug/dL — ABNORMAL HIGH (ref 250–450)
UIBC: 416 ug/dL

## 2017-04-29 LAB — URINALYSIS, COMPLETE (UACMP) WITH MICROSCOPIC
Bilirubin Urine: NEGATIVE
GLUCOSE, UA: NEGATIVE mg/dL
Ketones, ur: NEGATIVE mg/dL
NITRITE: NEGATIVE
PROTEIN: NEGATIVE mg/dL
Specific Gravity, Urine: 1.019 (ref 1.005–1.030)
pH: 5 (ref 5.0–8.0)

## 2017-04-29 LAB — FERRITIN: FERRITIN: 20 ng/mL (ref 11–307)

## 2017-04-29 LAB — FOLATE: Folate: 3.5 ng/mL — ABNORMAL LOW (ref >5.9)

## 2017-04-29 MED ORDER — OMEPRAZOLE 40 MG PO CPDR: 40.0000 mg | DELAYED_RELEASE_CAPSULE | Freq: Every day | ORAL | 3 refills | Status: DC

## 2017-04-29 NOTE — Progress Notes (Signed)
Jonathon Bellows MD, MRCP(U.K) [address]  [place]  [place], [place] [address]  Main: [phone]  Fax: [phone]   Gastroenterology Consultation  Referring Provider:     Will Bonnet, MD Primary Care Physician:  Lucille Passy, MD Primary Gastroenterologist:  Dr. Jonathon Bellows  Reason for Consultation:     Early satiety.         HPI:   Jacqueline Fields is a 27 y.o. y/o female referred for consultation & management  by Dr. Deborra Medina, Marciano Sequin, MD.  She has been referred for early satiety and weight loss.   She says she has had early satiety since 02/2017. Lost 22 lbs since 01/2017 . Cant eat more than 1/2 a burger. No hunger. No fevers. IF she forces herself to eat can throw up . Denies any abdominal pains. She had her gall bladder removed and since then stools has been more softer. She was unaware that she was anemic. Denies any nose bleeds, blood in the urine. Does not have her periods regularly lasts just 24 hours and is very minimal. No family history of colon cancer. No alcohol or smoking . No heartburn .   Iron/TIBC/Ferritin/ %Sat No results found for: IRON, TIBC, FERRITIN, IRONPCTSAT CBC Latest Ref Rng & Units 04/22/2017 11/15/2015 11/14/2015  WBC 3.4 - 10.8 x10E3/uL 12.0(H) 8.5 7.3  Hemoglobin 11.1 - 15.9 g/dL 11.9 11.5(L) 12.2  Hematocrit 34.0 - 46.6 % 37.2 34.7(L) 36.0  Platelets 150 - 379 x10E3/uL 544(H) 445(H) 525(H)     Past Medical History:  Diagnosis Date  . Allergy   . Anemia     Past Surgical History:  Procedure Laterality Date  . ADENOIDECTOMY    . CHOLECYSTECTOMY N/A 11/15/2015   Procedure: LAPAROSCOPIC CHOLECYSTECTOMY WITH INTRAOPERATIVE CHOLANGIOGRAM;  Surgeon: Clayburn Pert, MD;  Location: [facility] ORS;  Service: General;  Laterality: N/A;    Prior to Admission medications   Medication Sig Start Date End Date Taking? Authorizing Provider  montelukast (SINGULAIR) 10 MG tablet Take by mouth. 07/05/11  Yes [provider]  cetirizine (ZYRTEC) 10  MG tablet Take 10 mg by mouth daily.    [provider]  EPIPEN 2-PAK 0.3 MG/0.3ML SOAJ injection Inject 1 mg into the muscle as needed. Reported on 07/05/2015 09/01/12   [provider]  norgestimate-ethinyl estradiol (ORTHO-CYCLEN,SPRINTEC,PREVIFEM) 0.25-35 MG-MCG tablet Take 1 tablet by mouth 1 day or 1 dose. 09/09/16   Will Bonnet, MD    Family History  Problem Relation Age of Onset  . Diabetes Paternal Grandmother      Social History   Tobacco Use  . Smoking status: Never Smoker  . Smokeless tobacco: Never Used  Substance Use Topics  . Alcohol use: No  . Drug use: No    Allergies as of 04/29/2017  . (No Known Allergies)    Review of Systems:    All systems reviewed and negative except where noted in HPI.   Physical Exam:  BP 138/83 (BP Location: Left Arm, Patient Position: Sitting, Cuff Size: Normal)   Pulse 82   Temp 98.4 F (36.9 C) (Oral)   Ht 5\' 7"  (1.702 m)   Wt 209 lb 3.2 oz (94.9 kg)   LMP 03/31/2017   BMI 32.77 kg/m  Patient's last menstrual period was 03/31/2017. Psych:  Alert and cooperative. Normal mood and affect. General:   Alert,  Well-developed, well-nourished, pleasant and cooperative in NAD Head:  Normocephalic and atraumatic. Eyes:  Sclera clear, no icterus.  Conjunctiva pink. Ears:  Normal auditory acuity. Nose:  No deformity, discharge, or lesions. Mouth:  No deformity or lesions,oropharynx pink & moist. Neck:  Supple; no masses or thyromegaly. Lungs:  Respirations even and unlabored.  Clear throughout to auscultation.   No wheezes, crackles, or rhonchi. No acute distress. Heart:  Regular rate and rhythm; no murmurs, clicks, rubs, or gallops. Abdomen:  Normal bowel sounds.  No bruits.  Soft, non-tender and non-distended without masses, hepatosplenomegaly or hernias noted.  No guarding or rebound tenderness.    Neurologic:  Alert and oriented x3;  grossly normal neurologically. Skin:  Intact without significant lesions  or rashes. No jaundice. Lymph Nodes:  No significant cervical adenopathy. Psych:  Alert and cooperative. Normal mood and affect.  Imaging Studies: US Pelvis Transvanginal Non-ob (tv Only)  Result Date: 04/22/2017 OB/GYN ULTRASOUND: Please refer to the "Notes" tab to see imaging impression.    Assessment and Plan:   Jacqueline Fields is a 27 y.o. y/o female has been referred for early satiety, weight loss. I also note microcytic anemia with no overt blood loss. No evidence of diabetes.    Plan  1. Trial of PPI 2. Gastric emptying study  3. EGD+colonoscopy and if negative capsule study of the small bowel  4. If above negative and still losing weight will need CT scan of the chest/abdomen and pelvis.  5. Iron studies, urine analysis, b12,folate, celiac serology and H pylori stool antigen.   I have discussed alternative options, risks & benefits,  which include, but are not limited to, bleeding, infection, perforation,respiratory complication & drug reaction.  The patient agrees with this plan & written consent will be obtained.     Follow up in 8 weeks   Dr Jonathon Bellows MD,MRCP(U.K)

## 2017-04-30 ENCOUNTER — Other Ambulatory Visit: Payer: Self-pay

## 2017-04-30 DIAGNOSIS — R899 Unspecified abnormal finding in specimens from other organs, systems and tissues: Secondary | ICD-10-CM

## 2017-04-30 MED ORDER — FOLIC ACID 1 MG PO TABS: 1.0000 mg | ORAL_TABLET | Freq: Every day | ORAL | 2 refills | Status: AC

## 2017-04-30 MED ORDER — FERROUS SULFATE 325 (65 FE) MG PO TABS: 325.0000 mg | ORAL_TABLET | Freq: Two times a day (BID) | ORAL | 2 refills | Status: DC

## 2017-04-30 MED ORDER — VITAMIN B-12 1000 MCG PO TABS: 1000.0000 ug | ORAL_TABLET | Freq: Every day | ORAL | 2 refills | Status: AC

## 2017-04-30 NOTE — Telephone Encounter (Signed)
Advised patient of results per Dr. Vicente Males.   Patient to have another urinalysis due to prior specimen contamination.   Ordered Rx to pharmacy for pick-up.   1. She needs a pcp  2. Low folate levels : commnece on folic acid 1 mg a day  3. B12 levels low : commence on b12 shots- till she gets a pcp can start on 2000 mcg a day - once she gets pcp will need b12 shots as she likely has issues with absorption 4. Iron borderline low- suggest ferrous sulphate 325 mg BID 5. Proceed with endoscopic evaluation as suggested at office visit.

## 2017-05-01 LAB — CELIAC DISEASE PANEL
Endomysial Ab, IgA: NEGATIVE
IGA: 100 mg/dL (ref 87–352)
Tissue Transglutaminase Ab, IgA: <2 U/mL (ref 0–3)

## 2017-05-02 ENCOUNTER — Encounter: Payer: Self-pay | Admitting: Gastroenterology

## 2017-05-10 ENCOUNTER — Encounter: Payer: Self-pay | Admitting: *Deleted

## 2017-05-10 ENCOUNTER — Other Ambulatory Visit
Admission: RE | Admit: 2017-05-10 | Discharge: 2017-05-10 | Disposition: A | Discharge status: Home / Self Care | Payer: BLUE CROSS/BLUE SHIELD | Source: Ambulatory Visit | Attending: Gastroenterology | Admitting: Gastroenterology

## 2017-05-10 DIAGNOSIS — R899 Unspecified abnormal finding in specimens from other organs, systems and tissues: Secondary | ICD-10-CM

## 2017-05-10 LAB — URINALYSIS, ROUTINE W REFLEX MICROSCOPIC
BACTERIA UA: NONE SEEN
BILIRUBIN URINE: NEGATIVE
Glucose, UA: NEGATIVE mg/dL
Ketones, ur: NEGATIVE mg/dL
LEUKOCYTES UA: NEGATIVE
Nitrite: NEGATIVE
PROTEIN: NEGATIVE mg/dL
SPECIFIC GRAVITY, URINE: 1.017 (ref 1.005–1.030)
pH: 5 (ref 5.0–8.0)

## 2017-05-11 ENCOUNTER — Ambulatory Visit: Payer: BLUE CROSS/BLUE SHIELD | Admitting: Anesthesiology

## 2017-05-11 ENCOUNTER — Encounter: Payer: Self-pay | Admitting: Anesthesiology

## 2017-05-11 ENCOUNTER — Encounter
Admission: RE | Disposition: A | Discharge status: Home / Self Care | Payer: Self-pay | Source: Ambulatory Visit | Attending: Gastroenterology

## 2017-05-11 ENCOUNTER — Ambulatory Visit
Admission: RE | Admit: 2017-05-11 | Discharge: 2017-05-11 | Disposition: A | Discharge status: Home / Self Care | Payer: BLUE CROSS/BLUE SHIELD | Source: Ambulatory Visit | Attending: Gastroenterology | Admitting: Gastroenterology

## 2017-05-11 DIAGNOSIS — D509 Iron deficiency anemia, unspecified: Secondary | ICD-10-CM

## 2017-05-11 DIAGNOSIS — R6881 Early satiety: Secondary | ICD-10-CM

## 2017-05-11 DIAGNOSIS — R634 Abnormal weight loss: Secondary | ICD-10-CM

## 2017-05-11 DIAGNOSIS — Z79899 Other long term (current) drug therapy: Secondary | ICD-10-CM | POA: Insufficient documentation

## 2017-05-11 DIAGNOSIS — J45909 Unspecified asthma, uncomplicated: Secondary | ICD-10-CM | POA: Insufficient documentation

## 2017-05-11 DIAGNOSIS — K3189 Other diseases of stomach and duodenum: Secondary | ICD-10-CM | POA: Diagnosis not present

## 2017-05-11 DIAGNOSIS — K640 First degree hemorrhoids: Secondary | ICD-10-CM

## 2017-05-11 DIAGNOSIS — K219 Gastro-esophageal reflux disease without esophagitis: Secondary | ICD-10-CM | POA: Diagnosis not present

## 2017-05-11 DIAGNOSIS — K649 Unspecified hemorrhoids: Secondary | ICD-10-CM | POA: Diagnosis not present

## 2017-05-11 DIAGNOSIS — D649 Anemia, unspecified: Secondary | ICD-10-CM | POA: Diagnosis not present

## 2017-05-11 HISTORY — PX: ESOPHAGOGASTRODUODENOSCOPY (EGD) WITH PROPOFOL: SHX5813

## 2017-05-11 HISTORY — PX: COLONOSCOPY WITH PROPOFOL: SHX5780

## 2017-05-11 LAB — POCT PREGNANCY, URINE: PREG TEST UR: NEGATIVE

## 2017-05-11 SURGERY — ESOPHAGOGASTRODUODENOSCOPY (EGD) WITH PROPOFOL
Anesthesia: General

## 2017-05-11 MED ORDER — PROPOFOL 500 MG/50ML IV EMUL
INTRAVENOUS | Status: AC
  Filled 2017-05-11: qty 50

## 2017-05-11 MED ORDER — PROPOFOL 500 MG/50ML IV EMUL
INTRAVENOUS | Status: DC | PRN
  Administered 2017-05-11: 100 ug/kg/min via INTRAVENOUS

## 2017-05-11 MED ORDER — SODIUM CHLORIDE 0.9 % IV SOLN
INTRAVENOUS | Status: DC
  Administered 2017-05-11: 1000 mL via INTRAVENOUS
  Administered 2017-05-11: 11:00:00 via INTRAVENOUS

## 2017-05-11 NOTE — Op Note (Signed)
[facility] Gastroenterology Patient Name: Jacqueline Fields Procedure Date: 05/11/2017 10:37 AM MRN: [id number] Account #: [id number] Date of Birth: 1991-03-08 Admit Type: Outpatient Age: 27 Room: [facility] ENDO ROOM 1 Gender: Female Note Status: Finalized Procedure:            Colonoscopy Indications:          Iron deficiency anemia Providers:            Jonathon Bellows MD, MD Referring MD:         No Local Md, MD (Referring MD) Medicines:            Monitored Anesthesia Care Complications:        No immediate complications. Procedure:            Pre-Anesthesia Assessment:                       - Prior to the procedure, a History and Physical was                        performed, and patient medications, allergies and                        sensitivities were reviewed. The patient's tolerance of                        previous anesthesia was reviewed.                       - The risks and benefits of the procedure and the                        sedation options and risks were discussed with the                        patient. All questions were answered and informed                        consent was obtained.                       - ASA Grade Assessment: II - A patient with mild                        systemic disease.                       After obtaining informed consent, the colonoscope was                        passed under direct vision. Throughout the procedure,                        the patient's blood pressure, pulse, and oxygen                        saturations were monitored continuously. The                        Colonoscope was introduced through the anus and  advanced to the the cecum, identified by the                        appendiceal orifice, IC valve and transillumination.                        The colonoscopy was performed with ease. The patient                        tolerated the procedure well. The quality of the bowel                    preparation was adequate. Findings:      The perianal and digital rectal examinations were normal.      Non-bleeding internal hemorrhoids were found during retroflexion. The       hemorrhoids were small and Grade I (internal hemorrhoids that do not       prolapse).      The exam was otherwise without abnormality on direct and retroflexion       views. Impression:           - Non-bleeding internal hemorrhoids.                       - The examination was otherwise normal on direct and                        retroflexion views.                       - No specimens collected. Recommendation:       - Discharge patient to home (with escort).                       - Resume previous diet.                       - Continue present medications.                       - To visualize the small bowel, perform video capsule                        endoscopy in 2 weeks. Procedure Code(s):    --- Professional ---                       [phone], Colonoscopy, flexible; diagnostic, including                        collection of specimen(s) by brushing or washing, when                        performed (separate procedure) Diagnosis Code(s):    --- Professional ---                       K64.0, First degree hemorrhoids                       D50.9, Iron deficiency anemia, unspecified CPT copyright 2016 American Medical Association. All rights reserved. The codes documented in this report are preliminary and upon coder review may  be revised to meet current compliance requirements. Bailey Mech  Vicente Males, MD Jonathon Bellows MD, MD 05/11/2017 11:02:10 AM This report has been signed electronically. Number of Addenda: 0 Note Initiated On: 05/11/2017 10:37 AM Scope Withdrawal Time: 0 hours 7 minutes 23 seconds  Total Procedure Duration: 0 hours 9 minutes 58 seconds       [facility]

## 2017-05-11 NOTE — Anesthesia Preprocedure Evaluation (Signed)
Anesthesia Evaluation  Patient identified by MRN, date of birth, ID band Patient awake    Reviewed: Allergy & Precautions, H&P , NPO status , Patient's Chart, lab work & pertinent test results, reviewed documented beta blocker date and time   History of Anesthesia Complications Negative for: history of anesthetic complications  Airway Mallampati: II  TM Distance: >3 FB Neck ROM: full    Dental no notable dental hx. (+) Missing, Teeth Intact   Pulmonary neg shortness of breath, asthma , neg sleep apnea, neg COPD, neg recent URI,    Pulmonary exam normal breath sounds clear to auscultation       Cardiovascular Exercise Tolerance: Good negative cardio ROS Normal cardiovascular exam Rhythm:regular Rate:Normal     Neuro/Psych negative neurological ROS  negative psych ROS   GI/Hepatic negative GI ROS, Neg liver ROS, GERD  Medicated,  Endo/Other  negative endocrine ROS  Renal/GU negative Renal ROS  negative genitourinary   Musculoskeletal negative musculoskeletal ROS (+)   Abdominal   Peds negative pediatric ROS (+)  Hematology negative hematology ROS (+) anemia ,   Anesthesia Other Findings Past Medical History: No date: Allergy No date: Anemia   Reproductive/Obstetrics negative OB ROS                             Anesthesia Physical  Anesthesia Plan  ASA: II  Anesthesia Plan: General   Post-op Pain Management:    Induction:   PONV Risk Score and Plan:   Airway Management Planned: Nasal Cannula  Additional Equipment:   Intra-op Plan:   Post-operative Plan:   Informed Consent: I have reviewed the patients History and Physical, chart, labs and discussed the procedure including the risks, benefits and alternatives for the proposed anesthesia with the patient or authorized representative who has indicated his/her understanding and acceptance.   Dental Advisory Given and Dental  advisory given  Plan Discussed with: Anesthesiologist, CRNA and Surgeon  Anesthesia Plan Comments:         Anesthesia Quick Evaluation

## 2017-05-11 NOTE — Transfer of Care (Signed)
Immediate Anesthesia Transfer of Care Note  Patient: Jacqueline Fields  Procedure(s) Performed: ESOPHAGOGASTRODUODENOSCOPY (EGD) WITH PROPOFOL (N/A ) COLONOSCOPY WITH PROPOFOL (N/A )  Patient Location: PACU  Anesthesia Type:General  Level of Consciousness: awake, alert  and oriented  Airway & Oxygen Therapy: Patient Spontanous Breathing  Post-op Assessment: Report given to RN  Post vital signs: Reviewed and stable  Last Vitals:  Vitals:   05/11/17 1012 05/11/17 1100  BP: 117/82 110/78  Pulse: 79 88  Resp: 20 20  Temp: (!) 35.6 C (!) 36.1 C  SpO2: 100% 100%    Last Pain:  Vitals:   05/11/17 1100  TempSrc: Tympanic         Complications: No apparent anesthesia complications

## 2017-05-11 NOTE — H&P (Signed)
Jacqueline Bellows, MD [address] [place], [place], [place], [address] [address], [place], [place], [place], [address] Phone: [phone]  Fax: [phone]  Primary Care Physician:  Patient, No Pcp Per   Pre-Procedure History & Physical: HPI:  Jacqueline Fields is a 27 y.o. female is here for an endoscopy and colonoscopy    Past Medical History:  Diagnosis Date  . Allergy   . Anemia     Past Surgical History:  Procedure Laterality Date  . ADENOIDECTOMY    . CHOLECYSTECTOMY N/A 11/15/2015   Procedure: LAPAROSCOPIC CHOLECYSTECTOMY WITH INTRAOPERATIVE CHOLANGIOGRAM;  Surgeon: Clayburn Pert, MD;  Location: [facility] ORS;  Service: General;  Laterality: N/A;    Prior to Admission medications   Medication Sig Start Date End Date Taking? Authorizing Provider  cetirizine (ZYRTEC) 10 MG tablet Take 10 mg by mouth daily.   Yes [provider]  EPIPEN 2-PAK 0.3 MG/0.3ML SOAJ injection Inject 1 mg into the muscle as needed. Reported on 07/05/2015 09/01/12  Yes [provider]  ferrous sulfate 325 (65 FE) MG tablet Take 1 tablet (325 mg total) by mouth 2 (two) times daily with a meal. 04/30/17 07/29/17 Yes Jacqueline Bellows, MD  folic acid (FOLVITE) 1 MG tablet Take 1 tablet (1 mg total) by mouth daily. 04/30/17 05/30/17 Yes Jacqueline Bellows, MD  norgestimate-ethinyl estradiol (ORTHO-CYCLEN,SPRINTEC,PREVIFEM) 0.25-35 MG-MCG tablet Take 1 tablet by mouth 1 day or 1 dose. 09/09/16  Yes Will Bonnet, MD  omeprazole (PRILOSEC) 40 MG capsule Take 1 capsule (40 mg total) by mouth daily. 04/29/17  Yes Jacqueline Bellows, MD  vitamin B-12 (CYANOCOBALAMIN) 1000 MCG tablet Take 1 tablet (1,000 mcg total) by mouth daily. 04/30/17 05/30/17 Yes Jacqueline Bellows, MD    Allergies as of 04/29/2017  . (No Known Allergies)    Family History  Problem Relation Age of Onset  . Diabetes Paternal Grandmother     Social History   Socioeconomic History  . Marital status: Single    Spouse name: Not on file  .  Number of children: Not on file  . Years of education: Not on file  . Highest education level: Not on file  Social Needs  . Financial resource strain: Not on file  . Food insecurity - worry: Not on file  . Food insecurity - inability: Not on file  . Transportation needs - medical: Not on file  . Transportation needs - non-medical: Not on file  Occupational History  . Not on file  Tobacco Use  . Smoking status: Never Smoker  . Smokeless tobacco: Never Used  Substance and Sexual Activity  . Alcohol use: No  . Drug use: No  . Sexual activity: Not Currently    Birth control/protection: Pill  Other Topics Concern  . Not on file  Social History Narrative  . Not on file    Review of Systems: See HPI, otherwise negative ROS  Physical Exam: BP 117/82   Pulse 79   Temp (!) 96.1 F (35.6 C) (Tympanic)   Resp 20   Ht 5\' 7"  (1.702 m)   Wt 200 lb (90.7 kg)   SpO2 100%   BMI 31.32 kg/m  General:   Alert,  pleasant and cooperative in NAD Head:  Normocephalic and atraumatic. Neck:  Supple; no masses or thyromegaly. Lungs:  Clear throughout to auscultation, normal respiratory effort.    Heart:  +S1, +S2, Regular rate and rhythm, No edema. Abdomen:  Soft, nontender and nondistended. Normal bowel sounds, without  guarding, and without rebound.   Neurologic:  Alert and  oriented x4;  grossly normal neurologically.  Impression/Plan: Jacqueline Fields is here for an endoscopy and colonoscopy  to be performed for  evaluation of iron deficiency anemia     Risks, benefits, limitations, and alternatives regarding endoscopy have been reviewed with the patient.  Questions have been answered.  All parties agreeable.   Jacqueline Bellows, MD  05/11/2017, 10:32 AM

## 2017-05-11 NOTE — Anesthesia Postprocedure Evaluation (Signed)
Anesthesia Post Note  Patient: Jacqueline Fields  Procedure(s) Performed: ESOPHAGOGASTRODUODENOSCOPY (EGD) WITH PROPOFOL (N/A ) COLONOSCOPY WITH PROPOFOL (N/A )  Patient location during evaluation: PACU Anesthesia Type: General Level of consciousness: awake and alert and oriented Pain management: pain level controlled Vital Signs Assessment: post-procedure vital signs reviewed and stable Respiratory status: spontaneous breathing Cardiovascular status: blood pressure returned to baseline Anesthetic complications: no     Last Vitals:  Vitals:   05/11/17 1130 05/11/17 1140  BP: 127/80 125/87  Pulse:    Resp:    Temp:    SpO2:      Last Pain:  Vitals:   05/11/17 1100  TempSrc: Tympanic                 Sandon Yoho

## 2017-05-11 NOTE — Anesthesia Post-op Follow-up Note (Signed)
Anesthesia QCDR form completed.        

## 2017-05-11 NOTE — Op Note (Signed)
[facility] Gastroenterology Patient Name: Jacqueline Fields Procedure Date: 05/11/2017 10:39 AM MRN: [id number] Account #: [id number] Date of Birth: [birth year] Admit Type: Outpatient Age: 27 Room: [facility] ENDO ROOM 1 Gender: Female Note Status: Finalized Procedure:            Upper GI endoscopy Indications:          Iron deficiency anemia Providers:            Jonathon Bellows MD, MD Referring MD:         No Local Md, MD (Referring MD) Medicines:            Monitored Anesthesia Care Complications:        No immediate complications. Procedure:            Pre-Anesthesia Assessment:                       - Prior to the procedure, a History and Physical was                        performed, and patient medications, allergies and                        sensitivities were reviewed. The patient's tolerance of                        previous anesthesia was reviewed.                       - The risks and benefits of the procedure and the                        sedation options and risks were discussed with the                        patient. All questions were answered and informed                        consent was obtained.                       - ASA Grade Assessment: II - A patient with mild                        systemic disease.                       After obtaining informed consent, the endoscope was                        passed under direct vision. Throughout the procedure,                        the patient's blood pressure, pulse, and oxygen                        saturations were monitored continuously. The Endoscope                        was introduced through the mouth, and advanced to the  third part of duodenum. The upper GI endoscopy was                        accomplished with ease. The patient tolerated the                        procedure well. Findings:      The esophagus was normal.      The stomach was normal.      The examined  duodenum was normal. Biopsies for histology were taken with       a cold forceps for evaluation of celiac disease. Impression:           - Normal esophagus.                       - Normal stomach.                       - Normal examined duodenum. Biopsied. Recommendation:       - Await pathology results.                       - Perform a colonoscopy today. Procedure Code(s):    --- Professional ---                       [phone], Esophagogastroduodenoscopy, flexible, transoral;                        with biopsy, single or multiple Diagnosis Code(s):    --- Professional ---                       D50.9, Iron deficiency anemia, unspecified CPT copyright 2016 American Medical Association. All rights reserved. The codes documented in this report are preliminary and upon coder review may  be revised to meet current compliance requirements. Jonathon Bellows, MD Jonathon Bellows MD, MD 05/11/2017 10:48:40 AM This report has been signed electronically. Number of Addenda: 0 Note Initiated On: 05/11/2017 10:39 AM      [facility]

## 2017-05-12 ENCOUNTER — Encounter: Payer: Self-pay | Admitting: Gastroenterology

## 2017-05-12 ENCOUNTER — Other Ambulatory Visit: Payer: Self-pay

## 2017-05-12 DIAGNOSIS — R319 Hematuria, unspecified: Secondary | ICD-10-CM

## 2017-05-12 LAB — SURGICAL PATHOLOGY

## 2017-05-12 LAB — H. PYLORI ANTIGEN, STOOL: H. Pylori Stool Ag, Eia: NEGATIVE

## 2017-05-12 NOTE — Progress Notes (Signed)
urol

## 2017-05-13 ENCOUNTER — Telehealth: Payer: Self-pay

## 2017-05-13 NOTE — Telephone Encounter (Signed)
Called patient to advise of results per Dr. Vicente Males.   No answer. Completed urology referral yesterday.    - Moderate Hb in urine

## 2017-05-18 ENCOUNTER — Encounter: Payer: Self-pay | Admitting: Urology

## 2017-05-18 ENCOUNTER — Telehealth: Payer: Self-pay

## 2017-05-18 ENCOUNTER — Ambulatory Visit: Payer: BLUE CROSS/BLUE SHIELD | Admitting: Urology

## 2017-05-18 ENCOUNTER — Encounter: Payer: Self-pay | Admitting: Obstetrics and Gynecology

## 2017-05-18 ENCOUNTER — Other Ambulatory Visit: Payer: Self-pay | Admitting: Obstetrics and Gynecology

## 2017-05-18 VITALS — BP 148/88 | HR 110 | Ht 67.0 in | Wt 208.4 lb

## 2017-05-18 DIAGNOSIS — R319 Hematuria, unspecified: Secondary | ICD-10-CM

## 2017-05-18 DIAGNOSIS — N914 Secondary oligomenorrhea: Secondary | ICD-10-CM

## 2017-05-18 LAB — URINALYSIS, COMPLETE
Bilirubin, UA: NEGATIVE
GLUCOSE, UA: NEGATIVE
KETONES UA: NEGATIVE
NITRITE UA: NEGATIVE
Protein, UA: NEGATIVE
SPEC GRAV UA: 1.02 (ref 1.005–1.030)
UUROB: 0.2 mg/dL (ref 0.2–1.0)
pH, UA: 6.5 (ref 5.0–7.5)

## 2017-05-18 LAB — MICROSCOPIC EXAMINATION

## 2017-05-18 MED ORDER — NORELGESTROMIN-ETH ESTRADIOL 150-35 MCG/24HR TD PTWK
1.0000 | MEDICATED_PATCH | TRANSDERMAL | 12 refills | Status: DC
Start: 2017-05-18 — End: 2018-03-03

## 2017-05-18 NOTE — Telephone Encounter (Signed)
Pt saw SDJ 3wks ago, was put on bc patch.  What to do going forward?  [phone]

## 2017-05-18 NOTE — Telephone Encounter (Signed)
Pt is to follow up if patch not working. Needs to give it time to see if it helps issue. She needs to schedule an appt if she wants to talk about other options.

## 2017-05-18 NOTE — Telephone Encounter (Signed)
Pt states the patch is working.  She was given samples.  Needs rx sent to [place]. Ch.

## 2017-05-18 NOTE — Progress Notes (Signed)
05/18/2017 2:10 PM   Jacqueline Fields [birth year] [id number]  Referring provider: No referring provider defined for this encounter.  Chief Complaint  Patient presents with  . Hematuria    HPI: Jacqueline Fields is a 27 year old female seen in consultation at the request of Dr. Vicente Males for evaluation of hematuria.  A urinalysis in mid January showed moderate blood on dipstick and no significant RBCs on microscopy.  There were 6-30 squamous epithelial cells.  Her urinalysis was repeated last week and again showed moderate blood on dipstick however no microscopic hematuria.  A urinalysis 1 year prior showed no microscopic hematuria.  She denies previous history of urologic problems.  She has no voiding complaints and specifically denies frequency, urgency, dysuria or gross hematuria.  She denies flank, abdominal or pelvic pain.  A CT of the abdomen pelvis performed in 2012 showed no congenital genitourinary anomalies.  The   PMH: Past Medical History:  Diagnosis Date  . Allergy   . Anemia     Surgical History: Past Surgical History:  Procedure Laterality Date  . ADENOIDECTOMY    . CHOLECYSTECTOMY N/A 11/15/2015   Procedure: LAPAROSCOPIC CHOLECYSTECTOMY WITH INTRAOPERATIVE CHOLANGIOGRAM;  Surgeon: Clayburn Pert, MD;  Location: [facility] ORS;  Service: General;  Laterality: N/A;  . COLONOSCOPY WITH PROPOFOL N/A 05/11/2017   Procedure: COLONOSCOPY WITH PROPOFOL;  Surgeon: Jonathon Bellows, MD;  Location: [facility] ENDOSCOPY;  Service: Gastroenterology;  Laterality: N/A;  . ESOPHAGOGASTRODUODENOSCOPY (EGD) WITH PROPOFOL N/A 05/11/2017   Procedure: ESOPHAGOGASTRODUODENOSCOPY (EGD) WITH PROPOFOL;  Surgeon: Jonathon Bellows, MD;  Location: [facility] ENDOSCOPY;  Service: Gastroenterology;  Laterality: N/A;    Home Medications:  Allergies as of 05/18/2017   No Known Allergies     Medication List        Accurate as of 05/18/17  2:10 PM. Always use your most recent med list.          cetirizine 10 MG tablet Commonly  known as:  ZYRTEC Take 10 mg by mouth daily.   EPIPEN 2-PAK 0.3 mg/0.3 mL Soaj injection Generic drug:  EPINEPHrine Inject 1 mg into the muscle as needed. Reported on 07/05/2015   ferrous sulfate 325 (65 FE) MG tablet Take 1 tablet (325 mg total) by mouth 2 (two) times daily with a meal.   folic acid 1 MG tablet Commonly known as:  FOLVITE Take 1 tablet (1 mg total) by mouth daily.   norgestimate-ethinyl estradiol 0.25-35 MG-MCG tablet Commonly known as:  ORTHO-CYCLEN,SPRINTEC,PREVIFEM Take 1 tablet by mouth 1 day or 1 dose.   omeprazole 40 MG capsule Commonly known as:  PRILOSEC Take 1 capsule (40 mg total) by mouth daily.   vitamin B-12 1000 MCG tablet Commonly known as:  CYANOCOBALAMIN Take 1 tablet (1,000 mcg total) by mouth daily.       Allergies: No Known Allergies  Family History: Family History  Problem Relation Age of Onset  . Diabetes Paternal Grandmother   . Bladder Cancer Neg Hx   . Kidney cancer Neg Hx     Social History:  reports that  has never smoked. she has never used smokeless tobacco. She reports that she does not drink alcohol or use drugs.  ROS: UROLOGY Frequent Urination?: No Hard to postpone urination?: No Burning/pain with urination?: No Get up at night to urinate?: No Leakage of urine?: No Urine stream starts and stops?: No Trouble starting stream?: No Do you have to strain to urinate?: No Blood in urine?: No Urinary tract infection?: No Sexually transmitted disease?: No Injury  to kidneys or bladder?: No Painful intercourse?: No Weak stream?: No Currently pregnant?: No Vaginal bleeding?: No Last menstrual period?: n  Gastrointestinal Nausea?: Yes Vomiting?: No Indigestion/heartburn?: No Diarrhea?: No Constipation?: No  Constitutional Fever: No Night sweats?: No Weight loss?: Yes Fatigue?: No  Skin Skin rash/lesions?: No Itching?: No  Eyes Blurred vision?: No Double vision?: No  Ears/Nose/Throat Sore throat?:  No Sinus problems?: No  Hematologic/Lymphatic Swollen glands?: No Easy bruising?: No  Cardiovascular Leg swelling?: No Chest pain?: No  Respiratory Cough?: No Shortness of breath?: No  Endocrine Excessive thirst?: No  Musculoskeletal Back pain?: No Joint pain?: No  Neurological Headaches?: Yes Dizziness?: No  Psychologic Depression?: No Anxiety?: No  Physical Exam: BP (!) 148/88 (BP Location: Right Arm, Patient Position: Sitting, Cuff Size: Normal)   Pulse (!) 110   Ht 5\' 7"  (1.702 m)   Wt 208 lb 6.4 oz (94.5 kg)   BMI 32.64 kg/m   Constitutional:  Alert and oriented, No acute distress. HEENT: [place] AT, moist mucus membranes.  Trachea midline, no masses. Cardiovascular: No clubbing, cyanosis, or edema. Respiratory: Normal respiratory effort, no increased work of breathing.   Laboratory Data: Lab Results  Component Value Date   WBC 12.0 (H) 04/22/2017   HGB 11.9 04/22/2017   HCT 37.2 04/22/2017   MCV 81 04/22/2017   PLT 544 (H) 04/22/2017    Lab Results  Component Value Date   CREATININE 0.71 04/14/2017    No results found for: PSA1  Lab Results  Component Value Date   TESTOSTERONE <3 (L) 04/14/2017    Urinalysis Lab Results  Component Value Date   APPEARANCEUR HAZY (A) 05/10/2017   LEUKOCYTESUR NEGATIVE 05/10/2017   PROTEINUR NEGATIVE 05/10/2017   GLUCOSEU NEGATIVE 05/10/2017   RBCU 0-5 05/10/2017   BILIRUBINUR NEGATIVE 05/10/2017   NITRITE NEGATIVE 05/10/2017    Lab Results  Component Value Date   BACTERIA NONE SEEN 05/10/2017     Assessment & Plan:   1. Hematuria, unspecified type All of her recent urinalysis have not shown significant microscopic hematuria.  Based on American Urological Association practice guidelines asymptomatic microhematuria [facility]) is defined as three or greater red blood cells per high powered field on a properly collected urinary specimen in the absence of an obvious benign cause. A positive dipstick does  not define AMH, and evaluation should be based solely on findings from microscopic examination of urinary sediment and not on a dipstick reading.  Recommend one year follow-up for repeat urinalysis.   - Urinalysis, Complete  Return in about 1 year (around 05/18/2018) for Recheck.    Abbie Sons, [place] [address] [place], [place] [address] [phone]

## 2017-05-18 NOTE — Telephone Encounter (Signed)
Rx sent for 1 year supply

## 2017-05-19 NOTE — Telephone Encounter (Signed)
Pt aware via pt email

## 2017-11-17 ENCOUNTER — Ambulatory Visit (INDEPENDENT_AMBULATORY_CARE_PROVIDER_SITE_OTHER): Payer: BLUE CROSS/BLUE SHIELD | Admitting: Obstetrics and Gynecology

## 2017-11-17 ENCOUNTER — Encounter: Payer: Self-pay | Admitting: Obstetrics and Gynecology

## 2017-11-17 VITALS — BP 112/70 | HR 89 | Ht 67.0 in | Wt 221.0 lb

## 2017-11-17 DIAGNOSIS — Z1331 Encounter for screening for depression: Secondary | ICD-10-CM

## 2017-11-17 DIAGNOSIS — Z1339 Encounter for screening examination for other mental health and behavioral disorders: Secondary | ICD-10-CM | POA: Diagnosis not present

## 2017-11-17 DIAGNOSIS — N914 Secondary oligomenorrhea: Secondary | ICD-10-CM | POA: Diagnosis not present

## 2017-11-17 DIAGNOSIS — Z01419 Encounter for gynecological examination (general) (routine) without abnormal findings: Secondary | ICD-10-CM | POA: Diagnosis not present

## 2017-11-17 DIAGNOSIS — N915 Oligomenorrhea, unspecified: Secondary | ICD-10-CM | POA: Insufficient documentation

## 2017-11-17 NOTE — Progress Notes (Addendum)
Gynecology Annual Exam  PCP: Patient, No Pcp Per  Chief Complaint  Patient presents with  . Gynecologic Exam   History of Present Illness:  Ms. Jacqueline Fields is a 27 y.o. [id number] who LMP was Patient's last menstrual period was 08/31/2017 (approximate)., presents today for her annual examination.  Her menses are infrequent and she has secondary oligomenorrhea. She was seen for this in January and her contraception type was changed to [place].  She had a normal menses in February, March, April, and May. She has had no menses since that time.  She continued taking Xulane until July and when she did not have a menses after her third patch, she did not resume using the patch.   At her last visit she noted some severe GI issues, including weight loss, which were concerning for a malabsorption syndrome, which would also explain her lack of menses.  This is why she was given the patch, to circumvent any GI absorption issues she might have.  The problem did seem to resolve for a few months.  Her GI workup did not reveal anything on endoscopy per her report.  Though she did have low folate and B12 levels and it was recommended she begin taking folic acid PO and Z32 IM injections.  Her iron was borderline low.  It was recommended to her that she establish with a PCP.   She is single partner, contraception - Xulane (combined hormonal patch).  Last Pap: 1 year  Results were: no abnormalities /neg HPV DNA not done Hx of STDs: none  There is no FH of breast cancer. There is no FH of ovarian cancer. The patient does not do self-breast exams.  Tobacco use: The patient denies current or previous tobacco use. Alcohol use: social drinker Exercise: not active  The patient wears seatbelts: yes.   The patient reports that domestic violence in her life is absent.   Past Medical History:  Diagnosis Date  . Allergy   . Anemia     Past Surgical History:  Procedure Laterality Date  . ADENOIDECTOMY    .  CHOLECYSTECTOMY N/A 11/15/2015   Procedure: LAPAROSCOPIC CHOLECYSTECTOMY WITH INTRAOPERATIVE CHOLANGIOGRAM;  Surgeon: Clayburn Pert, MD;  Location: [facility] ORS;  Service: General;  Laterality: N/A;  . COLONOSCOPY WITH PROPOFOL N/A 05/11/2017   Procedure: COLONOSCOPY WITH PROPOFOL;  Surgeon: Jonathon Bellows, MD;  Location: [facility] ENDOSCOPY;  Service: Gastroenterology;  Laterality: N/A;  . ESOPHAGOGASTRODUODENOSCOPY (EGD) WITH PROPOFOL N/A 05/11/2017   Procedure: ESOPHAGOGASTRODUODENOSCOPY (EGD) WITH PROPOFOL;  Surgeon: Jonathon Bellows, MD;  [facility] ENDOSCOPY;  Service: Gastroenterology;  Laterality: N/A;    Prior to Admission medications   Medication Sig Start Date End Date Taking? Authorizing Provider  cetirizine (ZYRTEC) 10 MG tablet Take 10 mg by mouth daily.   Yes [provider]  EPIPEN 2-PAK 0.3 MG/0.3ML SOAJ injection Inject 1 mg into the muscle as needed. Reported on 07/05/2015 09/01/12   [provider]  ferrous sulfate 325 (65 FE) MG tablet Take 1 tablet (325 mg total) by mouth 2 (two) times daily with a meal. 04/30/17 07/29/17  Jonathon Bellows, MD  norelgestromin-ethinyl estradiol (ORTHO EVRA) 150-35 MCG/24HR transdermal patch Place 1 patch onto the skin once a week. Patient not taking: Reported on 11/17/2017 05/18/17   Will Bonnet, MD   Allergies: No Known Allergies  Obstetric History: [id number]  Social History   Socioeconomic History  . Marital status: Single    Spouse name: Not on file  . Number  of children: Not on file  . Years of education: Not on file  . Highest education level: Not on file  Occupational History  . Not on file  Social Needs  . Financial resource strain: Not on file  . Food insecurity:    Worry: Not on file    Inability: Not on file  . Transportation needs:    Medical: Not on file    Non-medical: Not on file  Tobacco Use  . Smoking status: Never Smoker  . Smokeless tobacco: Never Used  Substance and Sexual Activity  . Alcohol use: No  .  Drug use: No  . Sexual activity: Yes    Birth control/protection: None  Lifestyle  . Physical activity:    Days per week: 0 days    Minutes per session: Not on file  . Stress: Not on file  Relationships  . Social connections:    Talks on phone: Not on file    Gets together: Not on file    Attends religious service: Not on file    Active member of club or organization: Not on file    Attends meetings of clubs or organizations: Not on file    Relationship status: Not on file  . Intimate partner violence:    Fear of current or ex partner: Not on file    Emotionally abused: Not on file    Physically abused: Not on file    Forced sexual activity: Not on file  Other Topics Concern  . Not on file  Social History Narrative  . Not on file    Family History  Problem Relation Age of Onset  . Diabetes Paternal Grandmother   . Bladder Cancer Neg Hx   . Kidney cancer Neg Hx     Review of Systems  Constitutional: Negative.   HENT: Negative.   Eyes: Negative.   Respiratory: Negative.   Cardiovascular: Negative.   Gastrointestinal: Negative.   Genitourinary: Negative.   Musculoskeletal: Negative.   Skin: Negative.   Neurological: Negative.   Psychiatric/Behavioral: Negative.      Physical Exam BP 112/70 (BP Location: Left Arm, Patient Position: Sitting, Cuff Size: Normal)   Pulse 89   Ht 5\' 7"  (1.702 m)   Wt 221 lb (100.2 kg)   LMP 08/31/2017 (Approximate)   SpO2 99%   BMI 34.61 kg/m   Physical Exam  Constitutional: She is oriented to person, place, and time. She appears well-developed and well-nourished. No distress.  Genitourinary: Uterus normal. Pelvic exam was performed with patient supine. There is no rash, tenderness, lesion or injury on the right labia. There is no rash, tenderness, lesion or injury on the left labia. No erythema, tenderness or bleeding in the vagina. No signs of injury around the vagina. No vaginal discharge found. Right adnexum does not display  mass, does not display tenderness and does not display fullness. Left adnexum does not display mass, does not display tenderness and does not display fullness. Cervix does not exhibit motion tenderness, lesion, discharge or polyp.   Uterus is mobile and anteverted. Uterus is not enlarged, tender or exhibiting a mass.  HENT:  Head: Normocephalic and atraumatic.  Eyes: EOM are normal. No scleral icterus.  Neck: Normal range of motion. Neck supple. No thyromegaly present.  Cardiovascular: Normal rate and regular rhythm. Exam reveals no gallop and no friction rub.  No murmur heard. Pulmonary/Chest: Effort normal and breath sounds normal. No respiratory distress. She has no wheezes. She has no rales. Right breast exhibits  no inverted nipple, no mass, no nipple discharge, no skin change and no tenderness. Left breast exhibits no inverted nipple, no mass, no nipple discharge, no skin change and no tenderness.  Abdominal: Soft. Bowel sounds are normal. She exhibits no distension and no mass. There is no tenderness. There is no rebound and no guarding.  Musculoskeletal: Normal range of motion. She exhibits no edema or tenderness.  Lymphadenopathy:    She has no cervical adenopathy.       Right: No inguinal adenopathy present.       Left: No inguinal adenopathy present.  Neurological: She is alert and oriented to person, place, and time. No cranial nerve deficit.  Skin: Skin is warm and dry. No rash noted. No erythema.  Psychiatric: She has a normal mood and affect. Her behavior is normal. Judgment normal.    Female chaperone present for pelvic and breast  portions of the physical exam  Results: AUDIT Questionnaire (screen for alcoholism): 1 PHQ-9: 2  Urine pregnancy test: negative  Assessment: 27 y.o. G27P2002 female here for routine annual gynecologic examination  Plan: Problem List Items Addressed This Visit      Other   Oligomenorrhea, unspecified    Other Visit Diagnoses    Women's  annual routine gynecological examination    -  Primary   Screening for depression       Screening for alcoholism          Screening: -- Blood pressure screen normal -- Weight screening: obese: discussed management options, including lifestyle, dietary, and exercise. -- Depression screening negative (PHQ-9) -- Nutrition: normal -- cholesterol screening: not due for screening -- osteoporosis screening: not due -- tobacco screening: not using -- alcohol screening: AUDIT questionnaire indicates low-risk usage. -- family history of breast cancer screening: done. not at high risk. -- no evidence of domestic violence or intimate partner violence. -- STD screening: gonorrhea/chlamydia NAAT not collected per patient request. -- pap smear not collected per ASCCP guidelines  Oligomenorrhea: Will discuss further with her any follow up recommended by Dr. Vicente Males. It appears her was going to do a capsule swallow study that was not performed. She did not appear to have Celiac Disease. However, some markers for absorption were low, like B12 and folate, as well as borderline iron levels.  Using the contraceptive patch, in theory, should improve the systemic uptake of the hormones, if there is an issue of absorption. She did respond initially. However, she is not responding.  Will consider other options of investigation and discuss with the patient.   15 minutes spent in face to face discussion with > 50% spent in counseling,management, and coordination of care of her oligomenorrhea.   Prentice Docker, MD 11/17/2017 5:42 PM

## 2017-11-22 ENCOUNTER — Telehealth: Payer: Self-pay | Admitting: Gastroenterology

## 2017-11-22 NOTE — Telephone Encounter (Signed)
Spoke with pt to get her scheduled for Fu with Dr. Vicente Males she was confused and unaware of why she needed the apt and states she will call Dr. Glennon Mac to find out what is going on

## 2017-11-22 NOTE — Telephone Encounter (Signed)
----- Message from Glennie Isle, [place] sent at 11/22/2017 11:12 AM EDT ----- Regarding: FW: Follow up Can you please schedule a follow up appt to discuss Iron deficiency anemia with Dr. Vicente Males. Just next available.   Thank you!  Ginger ----- Message ----- From: Jonathon Bellows, MD Sent: 11/22/2017  10:49 AM EDT To: Glennie Isle, CMA Subject: RE: Follow up                                  Yes please    ----- Message ----- From: Glennie Isle, CMA Sent: 11/19/2017  12:43 PM EDT To: Jonathon Bellows, MD Subject: FW: Follow up                                  Looks like Urology cleared her and she is scheduled for a 1year check up in Feb 2020. Would you like me to schedule a follow up appt to see you before scheduling the capsule endoscopy?  Ginger ----- Message ----- From: Jonathon Bellows, MD Sent: 11/19/2017  10:58 AM EDT To: Will Bonnet, MD, Ginger Feldpausch, CMA Subject: RE: Follow up                                  Good morning DR Glennon Mac   I looked back at my notes. Looks like she was lost to follow up .   I was seeing her for iron and b12 deficiency anemia.   Looking back I noted she had blood in her urine and wanted her to see an urologist to ensure that it was not the cause of her iron deficiency and anemia, If urology work up is negative then she will need a capsule study of the small bowel to r/o any causes for anemia.   I will have Ginger call the patient to see where her urology evaluation is at and decide on timing of the capsule study   Regards  Kiran    Notes recorded by Jonathon Bellows, MD on 04/30/2017 at 8:15 AM EST 1. She needs a pcp  2. Low folate levels : commnece on folic acid 1 mg a day  3. B12 levels low : commence on b12 shots- till she gets a pcp can start on 2000 mcg a day - once she gets pcp will need b12 shots as she likely has issues with absorption 4. Iron borderline low- suggest ferrous sulphate 325 mg BID 5. Proceed with endoscopic  evaluation as suggested at office visit     ----- Message ----- From: Will Bonnet, MD Sent: 11/17/2017   5:45 PM EDT To: Jonathon Bellows, MD Subject: Follow up                                      Hi Dr. Vicente Males, Thank you for seeing this patient earlier this year.  Looking at your notes, I can not tell what your impression was of her situation. She seems to be improved symptomatically overall. However, your lab results showed some deficiency in B12, folate, and possibly iron.  Do you believe she has an issue was absorption?  There was another study that you had suggested to assess her small bowel. However, I can  not see whether this was accomplished.    Do you have any other thoughts on this patient? Thank you for your time. Sincerely, Prentice Docker, MD

## 2018-02-21 DIAGNOSIS — Z23 Encounter for immunization: Secondary | ICD-10-CM | POA: Diagnosis not present

## 2018-02-28 ENCOUNTER — Inpatient Hospital Stay
Admission: EM | Admit: 2018-02-28 | Discharge: 2018-03-03 | DRG: 871 | Disposition: A | Discharge status: Home / Self Care | Payer: BLUE CROSS/BLUE SHIELD | Attending: Internal Medicine | Admitting: Internal Medicine

## 2018-02-28 ENCOUNTER — Other Ambulatory Visit: Payer: Self-pay

## 2018-02-28 ENCOUNTER — Emergency Department: Payer: BLUE CROSS/BLUE SHIELD

## 2018-02-28 ENCOUNTER — Encounter: Payer: Self-pay | Admitting: *Deleted

## 2018-02-28 DIAGNOSIS — Z6836 Body mass index (BMI) 36.0-36.9, adult: Secondary | ICD-10-CM

## 2018-02-28 DIAGNOSIS — J189 Pneumonia, unspecified organism: Secondary | ICD-10-CM | POA: Diagnosis not present

## 2018-02-28 DIAGNOSIS — R0602 Shortness of breath: Secondary | ICD-10-CM | POA: Diagnosis not present

## 2018-02-28 DIAGNOSIS — R81 Glycosuria: Secondary | ICD-10-CM | POA: Diagnosis present

## 2018-02-28 DIAGNOSIS — J4520 Mild intermittent asthma, uncomplicated: Secondary | ICD-10-CM | POA: Diagnosis not present

## 2018-02-28 DIAGNOSIS — R079 Chest pain, unspecified: Secondary | ICD-10-CM | POA: Diagnosis not present

## 2018-02-28 DIAGNOSIS — R05 Cough: Secondary | ICD-10-CM | POA: Diagnosis not present

## 2018-02-28 DIAGNOSIS — R062 Wheezing: Secondary | ICD-10-CM | POA: Diagnosis not present

## 2018-02-28 DIAGNOSIS — J9601 Acute respiratory failure with hypoxia: Secondary | ICD-10-CM | POA: Diagnosis not present

## 2018-02-28 DIAGNOSIS — A419 Sepsis, unspecified organism: Secondary | ICD-10-CM

## 2018-02-28 DIAGNOSIS — E669 Obesity, unspecified: Secondary | ICD-10-CM | POA: Diagnosis not present

## 2018-02-28 DIAGNOSIS — J181 Lobar pneumonia, unspecified organism: Secondary | ICD-10-CM | POA: Diagnosis not present

## 2018-02-28 LAB — CBC
HCT: 36.7 % (ref 36.0–46.0)
Hemoglobin: 11.9 g/dL — ABNORMAL LOW (ref 12.0–15.0)
MCH: 27.9 pg (ref 26.0–34.0)
MCHC: 32.4 g/dL (ref 30.0–36.0)
MCV: 85.9 fL (ref 80.0–100.0)
Platelets: 445 10*3/uL — ABNORMAL HIGH (ref 150–400)
RBC: 4.27 MIL/uL (ref 3.87–5.11)
RDW: 12.7 % (ref 11.5–15.5)
WBC: 21.5 10*3/uL — ABNORMAL HIGH (ref 4.0–10.5)
nRBC: 0 % (ref 0.0–0.2)

## 2018-02-28 LAB — BASIC METABOLIC PANEL
Anion gap: 8 (ref 5–15)
BUN: 6 mg/dL (ref 6–20)
CO2: 25 mmol/L (ref 22–32)
Calcium: 9.3 mg/dL (ref 8.9–10.3)
Chloride: 105 mmol/L (ref 98–111)
Creatinine, Ser: 0.62 mg/dL (ref 0.44–1.00)
GFR calc Af Amer: >60 mL/min (ref >60)
GFR calc non Af Amer: >60 mL/min (ref >60)
Glucose, Bld: 152 mg/dL — ABNORMAL HIGH (ref 70–99)
Potassium: 4.1 mmol/L (ref 3.5–5.1)
Sodium: 138 mmol/L (ref 135–145)

## 2018-02-28 LAB — TROPONIN I: Troponin I: <0.03 ng/mL (ref <0.03)

## 2018-02-28 LAB — POCT PREGNANCY, URINE: PREG TEST UR: NEGATIVE

## 2018-02-28 MED ORDER — HYDROCOD POLST-CPM POLST ER 10-8 MG/5ML PO SUER
5.0000 mL | Freq: Once | ORAL | Status: AC
Start: 2018-02-28 — End: 2018-02-28
  Administered 2018-02-28: 5 mL via ORAL
  Filled 2018-02-28: qty 5

## 2018-02-28 MED ORDER — SODIUM CHLORIDE 0.9 % IV BOLUS
1000.0000 mL | Freq: Once | INTRAVENOUS | Status: AC
  Administered 2018-02-28: 1000 mL via INTRAVENOUS

## 2018-02-28 MED ORDER — IPRATROPIUM-ALBUTEROL 0.5-2.5 (3) MG/3ML IN SOLN
3.0000 mL | Freq: Once | RESPIRATORY_TRACT | Status: AC
  Administered 2018-02-28: 3 mL via RESPIRATORY_TRACT
  Filled 2018-02-28: qty 3

## 2018-02-28 NOTE — ED Triage Notes (Signed)
[facility] sent from [facility] for eval of sob.  Pt had 2 breathing treatments and decadron im.  Pt states it hurts to breathe deep.  Pt alert.

## 2018-02-28 NOTE — ED Notes (Signed)
Pt reports a cough and wheezing.  Pt was seen at urgent care and sent to er for eval.  Pt was given breathing treatments and decadron with some relief.  Scattered wheezes heard throughout.  Pt alert   Family with pt

## 2018-02-28 NOTE — ED Notes (Signed)
poct pregnancy Negative 

## 2018-03-01 ENCOUNTER — Emergency Department: Payer: BLUE CROSS/BLUE SHIELD

## 2018-03-01 DIAGNOSIS — J189 Pneumonia, unspecified organism: Secondary | ICD-10-CM | POA: Diagnosis not present

## 2018-03-01 DIAGNOSIS — A419 Sepsis, unspecified organism: Secondary | ICD-10-CM | POA: Diagnosis not present

## 2018-03-01 DIAGNOSIS — J181 Lobar pneumonia, unspecified organism: Secondary | ICD-10-CM | POA: Diagnosis present

## 2018-03-01 DIAGNOSIS — E669 Obesity, unspecified: Secondary | ICD-10-CM | POA: Diagnosis not present

## 2018-03-01 DIAGNOSIS — Z6836 Body mass index (BMI) 36.0-36.9, adult: Secondary | ICD-10-CM | POA: Diagnosis not present

## 2018-03-01 DIAGNOSIS — R81 Glycosuria: Secondary | ICD-10-CM | POA: Diagnosis not present

## 2018-03-01 DIAGNOSIS — R05 Cough: Secondary | ICD-10-CM | POA: Diagnosis not present

## 2018-03-01 DIAGNOSIS — J9601 Acute respiratory failure with hypoxia: Secondary | ICD-10-CM | POA: Diagnosis not present

## 2018-03-01 DIAGNOSIS — J4520 Mild intermittent asthma, uncomplicated: Secondary | ICD-10-CM | POA: Diagnosis present

## 2018-03-01 DIAGNOSIS — R062 Wheezing: Secondary | ICD-10-CM | POA: Diagnosis not present

## 2018-03-01 LAB — CG4 I-STAT (LACTIC ACID): Lactic Acid, Venous: 2.81 mmol/L (ref 0.5–1.9)

## 2018-03-01 LAB — URINALYSIS, COMPLETE (UACMP) WITH MICROSCOPIC
BILIRUBIN URINE: NEGATIVE
Bacteria, UA: NONE SEEN
KETONES UR: NEGATIVE mg/dL
LEUKOCYTES UA: NEGATIVE
NITRITE: NEGATIVE
PH: 6 (ref 5.0–8.0)
Protein, ur: NEGATIVE mg/dL
Specific Gravity, Urine: 1.036 — ABNORMAL HIGH (ref 1.005–1.030)

## 2018-03-01 LAB — TSH: TSH: 0.816 u[IU]/mL (ref 0.350–4.500)

## 2018-03-01 LAB — FIBRIN DERIVATIVES D-DIMER (ARMC ONLY): Fibrin derivatives D-dimer (ARMC): 513.32 ng/mL (FEU) — ABNORMAL HIGH (ref 0.00–499.00)

## 2018-03-01 LAB — HEMOGLOBIN A1C
Hgb A1c MFr Bld: 4.9 % (ref 4.8–5.6)
Mean Plasma Glucose: 93.93 mg/dL

## 2018-03-01 LAB — PROCALCITONIN

## 2018-03-01 LAB — INFLUENZA PANEL BY PCR (TYPE A & B)
Influenza A By PCR: NEGATIVE
Influenza B By PCR: NEGATIVE

## 2018-03-01 LAB — LACTIC ACID, PLASMA: Lactic Acid, Venous: 2.4 mmol/L (ref 0.5–1.9)

## 2018-03-01 LAB — BRAIN NATRIURETIC PEPTIDE: B Natriuretic Peptide: 21 pg/mL (ref 0.0–100.0)

## 2018-03-01 MED ORDER — SODIUM CHLORIDE 0.9 % IV BOLUS (SEPSIS)
2000.0000 mL | Freq: Once | INTRAVENOUS | Status: AC
  Administered 2018-03-01: 1000 mL via INTRAVENOUS

## 2018-03-01 MED ORDER — HYDROCOD POLST-CPM POLST ER 10-8 MG/5ML PO SUER
5.0000 mL | Freq: Two times a day (BID) | ORAL | Status: DC
  Administered 2018-03-01 – 2018-03-03 (×5): 5 mL via ORAL
  Filled 2018-03-01 (×5): qty 5

## 2018-03-01 MED ORDER — AZITHROMYCIN 250 MG PO TABS: ORAL_TABLET | ORAL | 0 refills | Status: DC

## 2018-03-01 MED ORDER — SODIUM CHLORIDE 0.9 % IV SOLN
500.0000 mg | INTRAVENOUS | Status: DC
  Filled 2018-03-01: qty 500

## 2018-03-01 MED ORDER — IPRATROPIUM-ALBUTEROL 0.5-2.5 (3) MG/3ML IN SOLN
3.0000 mL | Freq: Four times a day (QID) | RESPIRATORY_TRACT | Status: DC | PRN
  Administered 2018-03-01 – 2018-03-02 (×3): 3 mL via RESPIRATORY_TRACT
  Filled 2018-03-01 (×2): qty 3

## 2018-03-01 MED ORDER — ONDANSETRON HCL 4 MG/2ML IJ SOLN: 4.0000 mg | Freq: Four times a day (QID) | INTRAMUSCULAR | Status: DC | PRN

## 2018-03-01 MED ORDER — SODIUM CHLORIDE 0.9 % IV SOLN
500.0000 mg | INTRAVENOUS | Status: DC
  Administered 2018-03-01: 500 mg via INTRAVENOUS
  Filled 2018-03-01: qty 500

## 2018-03-01 MED ORDER — ACETAMINOPHEN 325 MG PO TABS: 650.0000 mg | ORAL_TABLET | Freq: Four times a day (QID) | ORAL | Status: DC | PRN

## 2018-03-01 MED ORDER — ALBUTEROL SULFATE (2.5 MG/3ML) 0.083% IN NEBU
2.5000 mg | INHALATION_SOLUTION | Freq: Once | RESPIRATORY_TRACT | Status: AC
  Administered 2018-03-01: 2.5 mg via RESPIRATORY_TRACT
  Filled 2018-03-01: qty 3

## 2018-03-01 MED ORDER — AMOXICILLIN 500 MG PO TABS: 1000.0000 mg | ORAL_TABLET | Freq: Three times a day (TID) | ORAL | 0 refills | Status: DC

## 2018-03-01 MED ORDER — METHYLPREDNISOLONE SODIUM SUCC 125 MG IJ SOLR
60.0000 mg | INTRAMUSCULAR | Status: DC
  Administered 2018-03-01 – 2018-03-02 (×2): 60 mg via INTRAVENOUS
  Filled 2018-03-01 (×2): qty 2

## 2018-03-01 MED ORDER — AZITHROMYCIN 500 MG PO TABS
500.0000 mg | ORAL_TABLET | Freq: Every day | ORAL | Status: AC
  Administered 2018-03-01 – 2018-03-03 (×3): 500 mg via ORAL
  Filled 2018-03-01 (×3): qty 1

## 2018-03-01 MED ORDER — DOCUSATE SODIUM 100 MG PO CAPS
100.0000 mg | ORAL_CAPSULE | Freq: Two times a day (BID) | ORAL | Status: DC
  Administered 2018-03-01 – 2018-03-03 (×4): 100 mg via ORAL
  Filled 2018-03-01 (×5): qty 1

## 2018-03-01 MED ORDER — IOHEXOL 350 MG/ML SOLN
75.0000 mL | Freq: Once | INTRAVENOUS | Status: AC | PRN
  Administered 2018-03-01: 75 mL via INTRAVENOUS

## 2018-03-01 MED ORDER — PREDNISONE 20 MG PO TABS: 60.0000 mg | ORAL_TABLET | Freq: Every day | ORAL | 0 refills | Status: DC

## 2018-03-01 MED ORDER — ENOXAPARIN SODIUM 40 MG/0.4ML ~~LOC~~ SOLN
40.0000 mg | SUBCUTANEOUS | Status: DC
  Administered 2018-03-02: 40 mg via SUBCUTANEOUS
  Filled 2018-03-01 (×2): qty 0.4

## 2018-03-01 MED ORDER — ACETAMINOPHEN 650 MG RE SUPP: 650.0000 mg | Freq: Four times a day (QID) | RECTAL | Status: DC | PRN

## 2018-03-01 MED ORDER — SODIUM CHLORIDE 0.9 % IV SOLN
2.0000 g | INTRAVENOUS | Status: DC
  Administered 2018-03-01 – 2018-03-02 (×2): 2 g via INTRAVENOUS
  Filled 2018-03-01: qty 2
  Filled 2018-03-01: qty 20
  Filled 2018-03-01: qty 2

## 2018-03-01 MED ORDER — SODIUM CHLORIDE 0.9 % IV SOLN
INTRAVENOUS | Status: DC
  Administered 2018-03-01: 09:00:00 via INTRAVENOUS

## 2018-03-01 MED ORDER — AMOXICILLIN 500 MG PO CAPS: 1000.0000 mg | ORAL_CAPSULE | Freq: Once | ORAL | Status: DC

## 2018-03-01 MED ORDER — AZITHROMYCIN 500 MG PO TABS: 500.0000 mg | ORAL_TABLET | ORAL | Status: DC

## 2018-03-01 MED ORDER — CYCLOBENZAPRINE HCL 10 MG PO TABS: 5.0000 mg | ORAL_TABLET | Freq: Three times a day (TID) | ORAL | Status: DC | PRN

## 2018-03-01 MED ORDER — ENOXAPARIN SODIUM 40 MG/0.4ML ~~LOC~~ SOLN
40.0000 mg | SUBCUTANEOUS | Status: DC
  Administered 2018-03-01: 40 mg via SUBCUTANEOUS
  Filled 2018-03-01: qty 0.4

## 2018-03-01 MED ORDER — SODIUM CHLORIDE 0.9 % IV SOLN
2.0000 g | INTRAVENOUS | Status: DC
  Administered 2018-03-01: 2 g via INTRAVENOUS
  Filled 2018-03-01: qty 20

## 2018-03-01 MED ORDER — HYDROCODONE-HOMATROPINE 5-1.5 MG/5ML PO SYRP: 5.0000 mL | ORAL_SOLUTION | Freq: Four times a day (QID) | ORAL | 0 refills | Status: DC | PRN

## 2018-03-01 MED ORDER — ONDANSETRON HCL 4 MG PO TABS: 4.0000 mg | ORAL_TABLET | Freq: Four times a day (QID) | ORAL | Status: DC | PRN

## 2018-03-01 MED ORDER — ALBUTEROL SULFATE HFA 108 (90 BASE) MCG/ACT IN AERS: INHALATION_SPRAY | RESPIRATORY_TRACT | 1 refills | Status: DC

## 2018-03-01 NOTE — ED Notes (Signed)
Patient transported to CT 

## 2018-03-01 NOTE — Progress Notes (Signed)
[place] [facility] [place] NAME: Jacqueline Fields    MR#:  [id number]  DATE OF BIRTH:  March 01, 1991  SUBJECTIVE:  CHIEF COMPLAINT:   Chief Complaint  Patient presents with  . Shortness of Breath   -Still complains of cough, chest tightness and chest pain on coughing -Off oxygen and on room air  REVIEW OF SYSTEMS:  Review of Systems  Constitutional: Negative for chills, fever and malaise/fatigue.  HENT: Negative for congestion, ear discharge, hearing loss and nosebleeds.   Respiratory: Positive for cough, shortness of breath and wheezing.   Cardiovascular: Positive for chest pain. Negative for palpitations.  Gastrointestinal: Negative for abdominal pain, constipation, diarrhea, nausea and vomiting.  Genitourinary: Negative for dysuria.  Musculoskeletal: Positive for myalgias.  Neurological: Negative for dizziness, focal weakness, seizures, weakness and headaches.  Psychiatric/Behavioral: Negative for depression.    DRUG ALLERGIES:  No Known Allergies  VITALS:  Blood pressure 109/60, pulse 84, temperature 97.6 F (36.4 C), temperature source Oral, resp. rate 17, height 5\' 7"  (1.702 m), weight 104.4 kg, last menstrual period 02/11/2018, SpO2 97 %, unknown if currently breastfeeding.  PHYSICAL EXAMINATION:  Physical Exam  GENERAL:  27 y.o.-year-old obese patient lying in the bed with no acute distress.  EYES: Pupils equal, round, reactive to light and accommodation. No scleral icterus. Extraocular muscles intact.  HEENT: Head atraumatic, normocephalic. Oropharynx and nasopharynx clear.  NECK:  Supple, no jugular venous distention. No thyroid enlargement, no tenderness.  LUNGS: Scant breath sounds bilaterally especially in the posterior bases, no rales,rhonchi or crepitation. No use of accessory muscles of respiration.  CARDIOVASCULAR: S1, S2 normal. No murmurs, rubs, or gallops.  ABDOMEN: Soft, nontender, nondistended. Bowel sounds present.  No organomegaly or mass.  EXTREMITIES: No pedal edema, cyanosis, or clubbing.  NEUROLOGIC: Cranial nerves II through XII are intact. Muscle strength 5/5 in all extremities. Sensation intact. Gait not checked.  PSYCHIATRIC: The patient is alert and oriented x 3.  SKIN: No obvious rash, lesion, or ulcer.    LABORATORY PANEL:   CBC Recent Labs  Lab 02/28/18 1929  WBC 21.5*  HGB 11.9*  HCT 36.7  PLT 445*   ------------------------------------------------------------------------------------------------------------------  Chemistries  Recent Labs  Lab 02/28/18 1929  NA 138  K 4.1  CL 105  CO2 25  GLUCOSE 152*  BUN 6  CREATININE 0.62  CALCIUM 9.3   ------------------------------------------------------------------------------------------------------------------  Cardiac Enzymes Recent Labs  Lab 02/28/18 1929  TROPONINI <0.03   ------------------------------------------------------------------------------------------------------------------  RADIOLOGY:  Dg Chest 2 View  Result Date: 02/28/2018 CLINICAL DATA:  Chest pain.  Shortness of breath. EXAM: CHEST - 2 VIEW COMPARISON:  Rib films June 09, 2007 FINDINGS: The heart size and mediastinal contours are within normal limits. Both lungs are clear. The visualized skeletal structures are unremarkable. IMPRESSION: No active cardiopulmonary disease. Electronically Signed   By: Dorise Bullion III M.D   On: 02/28/2018 20:04   Ct Angio Chest Pe W And/or Wo Contrast  Result Date: 03/01/2018 CLINICAL DATA:  Cough and wheezing EXAM: CT ANGIOGRAPHY CHEST WITH CONTRAST TECHNIQUE: Multidetector CT imaging of the chest was performed using the standard protocol during bolus administration of intravenous contrast. Multiplanar CT image reconstructions and MIPs were obtained to evaluate the vascular anatomy. CONTRAST:  58mL OMNIPAQUE IOHEXOL 350 MG/ML SOLN COMPARISON:  Chest x-ray February 28, 2018 FINDINGS: Cardiovascular: The thoracic  aorta is normal with no atherosclerosis, aneurysm, or dissection. The heart size is normal as well. Evaluation of the pulmonary arteries is limited  due to timing of contrast. No pulmonary emboli in the main pulmonary arteries. Significant artifact is seen beyond the level the main pulmonary arteries. Multiple potential filling defects in the pulmonary arteries are largely explained by stairstep artifact. There are few the cannot be definitively described as artifactual such as in the right upper lobe on series 5, image 80. Mediastinum/Nodes: No enlarged mediastinal, hilar, or axillary lymph nodes. Thyroid gland, trachea, and esophagus demonstrate no significant findings. Lungs/Pleura: Central airways are normal. No pneumothorax. Patchy ground-glass opacities in the right middle lobe on series 6, image 47 and in the right lower lobe on series 6, image 54. No suspicious nodules or masses. No other infiltrates. Upper Abdomen: Previous cholecystectomy. Musculoskeletal: No chest wall abnormality. No acute or significant osseous findings. Review of the MIP images confirms the above findings. IMPRESSION: 1. The study is limited in its evaluation for pulmonary emboli due to respiratory motion and timing of contrast. Significant stairstep artifact is noted. While most of the potential filling defects in the pulmonary arteries can be confidently described as artifactual, at least 1 in the left lung is not definitively due to artifact. This defect could represent artifact or a small pulmonary embolus. A repeat CTA when the patient is better able to follow breathing instructions would help to further evaluate. 2. Patchy ground-glass opacities in the right middle and lower lobes consistent with an infectious or inflammatory process. Recommend follow-up to resolution. 3. No other abnormalities. Electronically Signed   By: Dorise Bullion III M.D   On: 03/01/2018 02:35    EKG:   Orders placed or performed during the hospital  encounter of 02/28/18  . EKG 12-Lead  . EKG 12-Lead  . ED EKG within 10 minutes  . ED EKG within 10 minutes  . EKG    ASSESSMENT AND PLAN:   27 year old female with no significant past medical history presents to hospital secondary to worsening shortness of breath  1.  Acute hypoxic respiratory distress-secondary to pneumonitis in the right middle and lower lobes noted. -CT angiogram concerning for possible pulmonary embolism versus artifact. -Patient is not hypoxic, continue current management and repeat CT angiogram when breathing is improved -Started IV steroids and nebulizer treatments for bronchospasm -Cough meds added -Continue antibiotics with Rocephin and azithromycin at this time  2.  Mild intermittent asthma-well-controlled as a child.  Continue to monitor.  3.  DVT prophylaxis-Lovenox     All the records are reviewed and case discussed with Care Management/Social Workerr. Management plans discussed with the patient, family and they are in agreement.  CODE STATUS: Full code  TOTAL TIME TAKING CARE OF THIS PATIENT: 38 minutes.   POSSIBLE D/C IN 1-2 DAYS, DEPENDING ON CLINICAL CONDITION.   Gladstone Lighter M.D on 03/01/2018 at 2:19 PM  Between 7am to 6pm - Pager - [phone]  After 6pm go to [email or web address] - password [email or web address] [facility]  Office  [phone]  CC: Primary care physician; Patient, No Pcp Per

## 2018-03-01 NOTE — Progress Notes (Signed)
CODE SEPSIS - PHARMACY COMMUNICATION  **Broad Spectrum Antibiotics should be administered within 1 hour of Sepsis diagnosis**  Time Code Sepsis Called/Page Received: 0518  Antibiotics Ordered: azithro/ceftriaxone  Time of 1st antibiotic administration: 0420  Additional action taken by pharmacy:   If necessary, Name of Provider/Nurse Contacted:     Tobie Lords ,PharmD Clinical Pharmacist  03/01/2018  4:42 AM

## 2018-03-01 NOTE — H&P (Signed)
Jacqueline Fields is an 27 y.o. female.   Chief Complaint: Shortness of breath HPI: The patient with past medical history of asthma as a child presents to the emergency department complaining of shortness of breath.  She developed a non-productive cough yesterday. She was seen at urgent care earlier today and received 2 breathing treatments as well as steroids without relief of her symptoms.  She reported to the emergency department staff that it hurts to take a deep breath which prompted CT of the chest to rule out pulmonary embolism.  This test was a poor study due to motion artifact but suspicion of PE is low given groundglass opacities in the right middle and lower lobes.  The emergency department staff initiated sepsis protocol prior to calling the hospitalist service for admission.  Past Medical History:  Diagnosis Date  . Allergy   . Anemia     Past Surgical History:  Procedure Laterality Date  . ADENOIDECTOMY    . CHOLECYSTECTOMY N/A 11/15/2015   Procedure: LAPAROSCOPIC CHOLECYSTECTOMY WITH INTRAOPERATIVE CHOLANGIOGRAM;  Surgeon: Clayburn Pert, MD;  Location: [facility] ORS;  Service: General;  Laterality: N/A;  . COLONOSCOPY WITH PROPOFOL N/A 05/11/2017   Procedure: COLONOSCOPY WITH PROPOFOL;  Surgeon: Jonathon Bellows, MD;  Location: [facility] ENDOSCOPY;  Service: Gastroenterology;  Laterality: N/A;  . ESOPHAGOGASTRODUODENOSCOPY (EGD) WITH PROPOFOL N/A 05/11/2017   Procedure: ESOPHAGOGASTRODUODENOSCOPY (EGD) WITH PROPOFOL;  Surgeon: Jonathon Bellows, MD;  Location: [facility] ENDOSCOPY;  Service: Gastroenterology;  Laterality: N/A;    Family History  Problem Relation Age of Onset  . Diabetes Paternal Grandmother   . Bladder Cancer Neg Hx   . Kidney cancer Neg Hx    Social History:  reports that she has never smoked. She has never used smokeless tobacco. She reports that she does not drink alcohol or use drugs.  Allergies: No Known Allergies  Medications Prior to Admission  Medication Sig Dispense Refill   . cetirizine (ZYRTEC) 10 MG tablet Take 10 mg by mouth daily.    Marland Kitchen EPIPEN 2-PAK 0.3 MG/0.3ML SOAJ injection Inject 1 mg into the muscle as needed. Reported on 07/05/2015    . ferrous sulfate 325 (65 FE) MG tablet Take 1 tablet (325 mg total) by mouth 2 (two) times daily with a meal. 60 tablet 2  . norelgestromin-ethinyl estradiol (ORTHO EVRA) 150-35 MCG/24HR transdermal patch Place 1 patch onto the skin once a week. (Patient not taking: Reported on 11/17/2017) 3 patch 12    Results for orders placed or performed during the hospital encounter of 02/28/18 (from the past 48 hour(s))  Basic metabolic panel     Status: Abnormal   Collection Time: 02/28/18  7:29 PM  Result Value Ref Range   Sodium 138 135 - 145 mmol/L   Potassium 4.1 3.5 - 5.1 mmol/L   Chloride 105 98 - 111 mmol/L   CO2 25 22 - 32 mmol/L   Glucose, Bld 152 (H) 70 - 99 mg/dL   BUN 6 6 - 20 mg/dL   Creatinine, Ser 0.62 0.44 - 1.00 mg/dL   Calcium 9.3 8.9 - 10.3 mg/dL   GFR calc non Af Amer >60 >60 mL/min   GFR calc Af Amer >60 >60 mL/min    Comment: (NOTE) The eGFR has been calculated using the CKD EPI equation. This calculation has not been validated in all clinical situations. eGFR's persistently <60 mL/min signify possible Chronic Kidney Disease.    Anion gap 8 5 - 15    Comment: Performed at [facility], [address] [place], [place] [address]  CBC     Status: Abnormal   Collection Time: 02/28/18  7:29 PM  Result Value Ref Range   WBC 21.5 (H) 4.0 - 10.5 K/uL   RBC 4.27 3.87 - 5.11 MIL/uL   Hemoglobin 11.9 (L) 12.0 - 15.0 g/dL   HCT 36.7 36.0 - 46.0 %   MCV 85.9 80.0 - 100.0 fL   MCH 27.9 26.0 - 34.0 pg   MCHC 32.4 30.0 - 36.0 g/dL   RDW 12.7 11.5 - 15.5 %   Platelets 445 (H) 150 - 400 K/uL   nRBC 0.0 0.0 - 0.2 %    Comment: Performed at [facility], [address] [place], [place] [address]  Troponin I - ONCE - STAT     Status: None   Collection Time: 02/28/18  7:29 PM  Result Value  Ref Range   Troponin I <0.03 <0.03 ng/mL    Comment: Performed at [facility], [address] [place], [place] [address]  TSH     Status: None   Collection Time: 02/28/18  7:29 PM  Result Value Ref Range   TSH 0.816 0.350 - 4.500 uIU/mL    Comment: Performed by a 3rd Generation assay with a functional sensitivity of <=0.01 uIU/mL. Performed at [facility], [address] [place], [place] [address]   Pregnancy, urine POC     Status: None   Collection Time: 02/28/18  7:34 PM  Result Value Ref Range   Preg Test, Ur NEGATIVE NEGATIVE    Comment:        THE SENSITIVITY OF THIS METHODOLOGY IS >24 mIU/mL   Fibrin derivatives D-Dimer ([facility] only)     Status: Abnormal   Collection Time: 03/01/18 12:36 AM  Result Value Ref Range   Fibrin derivatives D-dimer ([facility]) 513.32 (H) 0.00 - 499.00 ng/mL (FEU)    Comment: (NOTE) <> Exclusion of Venous Thromboembolism (VTE) - OUTPATIENT ONLY   (Emergency Department or [place])   0-499 ng/ml (FEU): With a low to intermediate pretest probability                      for VTE this test result excludes the diagnosis                      of VTE.   >499 ng/ml (FEU) : VTE not excluded; additional work up for VTE is                      required. <> Testing on Inpatients and Evaluation of Disseminated Intravascular   Coagulation (DIC) Reference Range:   0-499 ng/ml (FEU) Performed at [facility], [address] [place], [place] [address]   Influenza panel by PCR (type A & B)     Status: None   Collection Time: 03/01/18 12:36 AM  Result Value Ref Range   Influenza A By PCR NEGATIVE NEGATIVE   Influenza B By PCR NEGATIVE NEGATIVE    Comment: (NOTE) The Xpert Xpress Flu assay is intended as an aid in the diagnosis of  influenza and should not be used as a sole basis for treatment.  This  assay is FDA approved for nasopharyngeal swab specimens only. Nasal  washings and aspirates are unacceptable for Xpert Xpress Flu  testing. Performed at [facility], [place], [place] [address]   CG4 I-STAT (Lactic acid)     Status: Abnormal  Collection Time: 03/01/18  3:14 AM  Result Value Ref Range   Lactic Acid, Venous 2.81 (HH) 0.5 - 1.9 mmol/L   Comment MD NOTIFIED, REPEAT TEST   Brain natriuretic peptide - IF patient is dyspneic     Status: None   Collection Time: 03/01/18  3:42 AM  Result Value Ref Range   B Natriuretic Peptide 21.0 0.0 - 100.0 pg/mL    Comment: Performed at [facility], [address] [place], [place] [address]  Procalcitonin     Status: None   Collection Time: 03/01/18  3:42 AM  Result Value Ref Range   Procalcitonin <0.10 ng/mL    Comment:        Interpretation: PCT (Procalcitonin) <= 0.5 ng/mL: Systemic infection (sepsis) is not likely. Local bacterial infection is possible. (NOTE)       Sepsis PCT Algorithm           Lower Respiratory Tract                                      Infection PCT Algorithm    ----------------------------     ----------------------------         PCT < 0.25 ng/mL                PCT < 0.10 ng/mL         Strongly encourage             Strongly discourage   discontinuation of antibiotics    initiation of antibiotics    ----------------------------     -----------------------------       PCT 0.25 - 0.50 ng/mL            PCT 0.10 - 0.25 ng/mL               OR       >80% decrease in PCT            Discourage initiation of                                            antibiotics      Encourage discontinuation           of antibiotics    ----------------------------     -----------------------------         PCT >= 0.50 ng/mL              PCT 0.26 - 0.50 ng/mL               AND        <80% decrease in PCT             Encourage initiation of                                             antibiotics       Encourage continuation           of antibiotics    ----------------------------     -----------------------------         PCT >= 0.50 ng/mL                  PCT > 0.50  ng/mL               AND         increase in PCT                  Strongly encourage                                      initiation of antibiotics    Strongly encourage escalation           of antibiotics                                     -----------------------------                                           PCT <= 0.25 ng/mL                                                 OR                                        > 80% decrease in PCT                                     Discontinue / Do not initiate                                             antibiotics Performed at [facility], [address] [place], [place] [address]   Urinalysis, Complete w Microscopic     Status: Abnormal   Collection Time: 03/01/18  4:09 AM  Result Value Ref Range   Color, Urine STRAW (A) YELLOW   APPearance CLEAR (A) CLEAR   Specific Gravity, Urine 1.036 (H) 1.005 - 1.030   pH 6.0 5.0 - 8.0   Glucose, UA >=500 (A) NEGATIVE mg/dL   Hgb urine dipstick SMALL (A) NEGATIVE   Bilirubin Urine NEGATIVE NEGATIVE   Ketones, ur NEGATIVE NEGATIVE mg/dL   Protein, ur NEGATIVE NEGATIVE mg/dL   Nitrite NEGATIVE NEGATIVE   Leukocytes, UA NEGATIVE NEGATIVE   RBC / HPF 0-5 0 - 5 RBC/hpf   WBC, UA 0-5 0 - 5 WBC/hpf   Bacteria, UA NONE SEEN NONE SEEN   Squamous Epithelial / LPF 0-5 0 - 5    Comment: Performed at [facility], [address] [place], [place] [address]  Lactic acid, plasma     Status: Abnormal   Collection Time: 03/01/18  5:28 AM  Result Value Ref Range   Lactic Acid, Venous 2.4 (HH) 0.5 - 1.9 mmol/L    Comment: CRITICAL RESULT CALLED TO, READ BACK BY AND VERIFIED WITH DEBRA BLADDOCK ON 03/01/18 AT 0604 QSD Performed at [facility], [address], [place], [place] [address]  Dg Chest 2 View  Result Date: 02/28/2018 CLINICAL DATA:  Chest pain.  Shortness of breath. EXAM: CHEST - 2 VIEW COMPARISON:  Rib films  June 09, 2007 FINDINGS: The heart size and mediastinal contours are within normal limits. Both lungs are clear. The visualized skeletal structures are unremarkable. IMPRESSION: No active cardiopulmonary disease. Electronically Signed   By: Dorise Bullion III M.D   On: 02/28/2018 20:04   Ct Angio Chest Pe W And/or Wo Contrast  Result Date: 03/01/2018 CLINICAL DATA:  Cough and wheezing EXAM: CT ANGIOGRAPHY CHEST WITH CONTRAST TECHNIQUE: Multidetector CT imaging of the chest was performed using the standard protocol during bolus administration of intravenous contrast. Multiplanar CT image reconstructions and MIPs were obtained to evaluate the vascular anatomy. CONTRAST:  32m OMNIPAQUE IOHEXOL 350 MG/ML SOLN COMPARISON:  Chest x-ray February 28, 2018 FINDINGS: Cardiovascular: The thoracic aorta is normal with no atherosclerosis, aneurysm, or dissection. The heart size is normal as well. Evaluation of the pulmonary arteries is limited due to timing of contrast. No pulmonary emboli in the main pulmonary arteries. Significant artifact is seen beyond the level the main pulmonary arteries. Multiple potential filling defects in the pulmonary arteries are largely explained by stairstep artifact. There are few the cannot be definitively described as artifactual such as in the right upper lobe on series 5, image 80. Mediastinum/Nodes: No enlarged mediastinal, hilar, or axillary lymph nodes. Thyroid gland, trachea, and esophagus demonstrate no significant findings. Lungs/Pleura: Central airways are normal. No pneumothorax. Patchy ground-glass opacities in the right middle lobe on series 6, image 47 and in the right lower lobe on series 6, image 54. No suspicious nodules or masses. No other infiltrates. Upper Abdomen: Previous cholecystectomy. Musculoskeletal: No chest wall abnormality. No acute or significant osseous findings. Review of the MIP images confirms the above findings. IMPRESSION: 1. The study is limited in  its evaluation for pulmonary emboli due to respiratory motion and timing of contrast. Significant stairstep artifact is noted. While most of the potential filling defects in the pulmonary arteries can be confidently described as artifactual, at least 1 in the left lung is not definitively due to artifact. This defect could represent artifact or a small pulmonary embolus. A repeat CTA when the patient is better able to follow breathing instructions would help to further evaluate. 2. Patchy ground-glass opacities in the right middle and lower lobes consistent with an infectious or inflammatory process. Recommend follow-up to resolution. 3. No other abnormalities. Electronically Signed   By: DDorise BullionIII M.D   On: 03/01/2018 02:35    Review of Systems  Constitutional: Negative for chills and fever.  HENT: Negative for sore throat and tinnitus.   Eyes: Negative for blurred vision and redness.  Respiratory: Positive for shortness of breath. Negative for cough.   Cardiovascular: Negative for chest pain, palpitations, orthopnea and PND.  Gastrointestinal: Negative for abdominal pain, diarrhea, nausea and vomiting.  Genitourinary: Negative for dysuria, frequency and urgency.  Musculoskeletal: Negative for joint pain and myalgias.  Skin: Negative for rash.       No lesions  Neurological: Negative for speech change, focal weakness and weakness.  Endo/Heme/Allergies: Does not bruise/bleed easily.       No temperature intolerance  Psychiatric/Behavioral: Negative for depression and suicidal ideas.    Blood pressure 110/69, pulse (!) 104, temperature (!) 97.5 F (36.4 C), temperature source Oral, resp. rate 17, height 5' 7"  (1.702 m), weight 104.4 kg, last menstrual period 02/11/2018, SpO2 99 %, unknown if currently breastfeeding.  Physical Exam  Vitals reviewed. Constitutional: She is oriented to person, place, and time. She appears well-developed and well-nourished. No distress.  HENT:  Head:  Normocephalic and atraumatic.  Mouth/Throat: Oropharynx is clear and moist.  Eyes: Pupils are equal, round, and reactive to light. Conjunctivae and EOM are normal. No scleral icterus.  Neck: Normal range of motion. Neck supple. No JVD present. No tracheal deviation present. No thyromegaly present.  Cardiovascular: Normal rate, regular rhythm and normal heart sounds. Exam reveals no gallop and no friction rub.  No murmur heard. Respiratory: Effort normal and breath sounds normal.  GI: Soft. Bowel sounds are normal. She exhibits no distension. There is no tenderness.  Genitourinary:  Genitourinary Comments: Deferred  Musculoskeletal: Normal range of motion. She exhibits no edema.  Lymphadenopathy:    She has no cervical adenopathy.  Neurological: She is alert and oriented to person, place, and time. No cranial nerve deficit. She exhibits normal muscle tone.  Skin: Skin is warm and dry. No rash noted. No erythema.  Psychiatric: She has a normal mood and affect. Her behavior is normal. Judgment and thought content normal.     Assessment/Plan This is a 27 year old female admitted for pneumonia. 1.  Pneumonia: Community-acquired; no hypoxia.  Continue azithromycin and ceftriaxone.  D-dimer likely elevated due to sepsis and thus suspicion of PE is relatively low. 2.  Sepsis: The patient meets criteria via tachycardia, tachypnea, leukocytosis and elevated lactic acid.  She is hemodynamically stable.  Aggressive hydration with intravenous fluid.  Follow blood cultures and sputum cultures for growth and sensitivities. 3.  Glucosuria: The patient does not carry a diagnosis of diabetes.  Check hemoglobin A1c and monitor blood glucose.  Etiology may be steroid therapy 4.  Obesity: BMI is 36; encourage healthy diet and exercise 5.  DVT prophylaxis: Lovenox 6.  GI prophylaxis: None The patient is a full code.  Time spent on admission orders and patient care approximately 45 minutes   Harrie Foreman, MD 03/01/2018, 7:15 AM

## 2018-03-01 NOTE — ED Provider Notes (Signed)
----------------------------------------- 2:22 AM on 03/01/2018 -----------------------------------------  ED ECG REPORT I, Hinda Kehr, the attending physician, personally viewed and interpreted this ECG.  Date: 02/28/2018 EKG Time: 19: 26 Rate: 134 Rhythm: Sinus tachycardia QRS Axis: normal Intervals: normal ST/T Wave abnormalities: Non-specific ST segment / T-wave changes, but no evidence of acute ischemia. Narrative Interpretation: no evidence of acute ischemia    ----------------------------------------- 3:34 AM on 03/01/2018 -----------------------------------------  .Critical Care Performed by: Hinda Kehr, MD Authorized by: Hinda Kehr, MD   Critical care provider statement:    Critical care time (minutes):  30   Critical care time was exclusive of:  Separately billable procedures and treating other patients   Critical care was necessary to treat or prevent imminent or life-threatening deterioration of the following conditions:  Sepsis   Critical care was time spent personally by me on the following activities:  Development of treatment plan with patient or surrogate, discussions with consultants, evaluation of patient's response to treatment, examination of patient, obtaining history from patient or surrogate, ordering and performing treatments and interventions, ordering and review of laboratory studies, ordering and review of radiographic studies, pulse oximetry, re-evaluation of patient's condition and review of old charts   Please see the note by Ms. Triplett for full H&P.  In summary, the patient was referred to the emergency department by the urgent care for evaluation of severe shortness of breath and general malaise.  She has a history of childhood asthma and has been gradually getting worse over the last couple of days.  She has a white blood cell count of greater than 21 and was severely tachypneic and short of breath upon arrival although that did improve  after a breathing treatment.  Influenza was negative and chest x-ray initially looks negative so we evaluated her with a d-dimer to see if we could rule out pulmonary embolism.  Unfortunately the d-dimer was slightly elevated so we obtained a CTA chest.  There was some artifact and one small area that was questionable for a filling defect that could theoretically be consistent with pulmonary embolism, but more importantly she has multi lobar pneumonia.  This is clinically consistent and the patient has no risk factors for pulmonary embolism.  I informed the patient and her mother about the filling deficit and need for eventual repeat imaging to rule out PE, but at this time I do not think it is appropriate nor necessary to anticoagulate her.  Initially the plan was going to be for discharge with outpatient antibiotics, but the patient continues to have difficulty breathing and has a resting oxygen saturation of about 92% and a heart rate of about 110 after a liter of fluid.  I obtained an i-STAT lactic acid which was 2.8 even after a liter of fluids.  Given that the patient easily meets sepsis criteria for multi lobar community-acquired pneumonia, has an elevated lactic acid, continues to be severely short of breath particularly with inspiration or exertion, I have initiated code sepsis including ceftriaxone 2 g IV, azithromycin 500 mg IV, and an additional 2 L of normal saline to meet the 30 mL/kg IV bolus goal.  I discussed the disposition plan with the patient and her mother and they agree that a stay in the hospital would likely be beneficial.  I  Discussed the case with Dr. Marcille Blanco with the hospitalist service for admission.  Final diagnoses:  Community acquired pneumonia, unspecified laterality  Sepsis, due to unspecified organism, unspecified whether acute organ dysfunction present ([place])  Hinda Kehr, MD 03/01/18 [phone]

## 2018-03-01 NOTE — ED Notes (Signed)
Report off to donna/henry rn

## 2018-03-02 ENCOUNTER — Observation Stay: Payer: BLUE CROSS/BLUE SHIELD

## 2018-03-02 DIAGNOSIS — J9601 Acute respiratory failure with hypoxia: Secondary | ICD-10-CM | POA: Diagnosis not present

## 2018-03-02 LAB — CBC
HCT: 32.5 % — ABNORMAL LOW (ref 36.0–46.0)
Hemoglobin: 10.2 g/dL — ABNORMAL LOW (ref 12.0–15.0)
MCH: 27.7 pg (ref 26.0–34.0)
MCHC: 31.4 g/dL (ref 30.0–36.0)
MCV: 88.3 fL (ref 80.0–100.0)
NRBC: 0 % (ref 0.0–0.2)
PLATELETS: 445 10*3/uL — AB (ref 150–400)
RBC: 3.68 MIL/uL — AB (ref 3.87–5.11)
RDW: 13 % (ref 11.5–15.5)
WBC: 15.7 10*3/uL — ABNORMAL HIGH (ref 4.0–10.5)

## 2018-03-02 LAB — BASIC METABOLIC PANEL
Anion gap: 7 (ref 5–15)
BUN: 7 mg/dL (ref 6–20)
CALCIUM: 8.8 mg/dL — AB (ref 8.9–10.3)
CO2: 25 mmol/L (ref 22–32)
Chloride: 110 mmol/L (ref 98–111)
Creatinine, Ser: 0.43 mg/dL — ABNORMAL LOW (ref 0.44–1.00)
GFR calc Af Amer: >60 mL/min (ref >60)
GLUCOSE: 137 mg/dL — AB (ref 70–99)
Potassium: 4.2 mmol/L (ref 3.5–5.1)
SODIUM: 142 mmol/L (ref 135–145)

## 2018-03-02 LAB — URINE CULTURE

## 2018-03-02 MED ORDER — BENZONATATE 100 MG PO CAPS
200.0000 mg | ORAL_CAPSULE | Freq: Three times a day (TID) | ORAL | Status: DC
  Administered 2018-03-02 – 2018-03-03 (×4): 200 mg via ORAL
  Filled 2018-03-02 (×4): qty 2

## 2018-03-02 MED ORDER — IOHEXOL 350 MG/ML SOLN
75.0000 mL | Freq: Once | INTRAVENOUS | Status: AC | PRN
  Administered 2018-03-02: 75 mL via INTRAVENOUS

## 2018-03-02 NOTE — Progress Notes (Signed)
[facility] at [place] NAME: Jacqueline Fields    MR#:  [id number]  DATE OF BIRTH:  [birth year]  SUBJECTIVE:  CHIEF COMPLAINT:   Chief Complaint  Patient presents with  . Shortness of Breath   - minimal improvement today- still feels very dyspneic, pain with cough  REVIEW OF SYSTEMS:  Review of Systems  Constitutional: Negative for chills, fever and malaise/fatigue.  HENT: Negative for congestion, ear discharge, hearing loss and nosebleeds.   Respiratory: Positive for cough, shortness of breath and wheezing.   Cardiovascular: Positive for chest pain. Negative for palpitations.  Gastrointestinal: Negative for abdominal pain, constipation, diarrhea, nausea and vomiting.  Genitourinary: Negative for dysuria.  Musculoskeletal: Positive for myalgias.  Neurological: Negative for dizziness, focal weakness, seizures, weakness and headaches.  Psychiatric/Behavioral: Negative for depression.    DRUG ALLERGIES:  No Known Allergies  VITALS:  Blood pressure 108/64, pulse 77, temperature 98 F (36.7 C), temperature source Oral, resp. rate 18, height 5\' 7"  (1.702 m), weight 106.7 kg, last menstrual period 02/11/2018, SpO2 98 %, unknown if currently breastfeeding.  PHYSICAL EXAMINATION:  Physical Exam  GENERAL:  27 y.o.-year-old obese patient lying in the bed with no acute distress.  EYES: Pupils equal, round, reactive to light and accommodation. No scleral icterus. Extraocular muscles intact.  HEENT: Head atraumatic, normocephalic. Oropharynx and nasopharynx clear.  NECK:  Supple, no jugular venous distention. No thyroid enlargement, no tenderness.  LUNGS: moving air bilaterally today but has significant expiratory wheeze, especially in the posterior bases, no rales,rhonchi or crepitation. No use of accessory muscles of respiration.  CARDIOVASCULAR: S1, S2 normal. No murmurs, rubs, or gallops.  ABDOMEN: Soft, nontender, nondistended. Bowel sounds  present. No organomegaly or mass.  EXTREMITIES: No pedal edema, cyanosis, or clubbing.  NEUROLOGIC: Cranial nerves II through XII are intact. Muscle strength 5/5 in all extremities. Sensation intact. Gait not checked.  PSYCHIATRIC: The patient is alert and oriented x 3.  SKIN: No obvious rash, lesion, or ulcer.    LABORATORY PANEL:   CBC Recent Labs  Lab 03/02/18 0325  WBC 15.7*  HGB 10.2*  HCT 32.5*  PLT 445*   ------------------------------------------------------------------------------------------------------------------  Chemistries  Recent Labs  Lab 03/02/18 0325  NA 142  K 4.2  CL 110  CO2 25  GLUCOSE 137*  BUN 7  CREATININE 0.43*  CALCIUM 8.8*   ------------------------------------------------------------------------------------------------------------------  Cardiac Enzymes Recent Labs  Lab 02/28/18 1929  TROPONINI <0.03   ------------------------------------------------------------------------------------------------------------------  RADIOLOGY:  Dg Chest 2 View  Result Date: 02/28/2018 CLINICAL DATA:  Chest pain.  Shortness of breath. EXAM: CHEST - 2 VIEW COMPARISON:  Rib films June 09, 2007 FINDINGS: The heart size and mediastinal contours are within normal limits. Both lungs are clear. The visualized skeletal structures are unremarkable. IMPRESSION: No active cardiopulmonary disease. Electronically Signed   By: Dorise Bullion III M.D   On: 02/28/2018 20:04   Ct Angio Chest Pe W And/or Wo Contrast  Result Date: 03/01/2018 CLINICAL DATA:  Cough and wheezing EXAM: CT ANGIOGRAPHY CHEST WITH CONTRAST TECHNIQUE: Multidetector CT imaging of the chest was performed using the standard protocol during bolus administration of intravenous contrast. Multiplanar CT image reconstructions and MIPs were obtained to evaluate the vascular anatomy. CONTRAST:  47mL OMNIPAQUE IOHEXOL 350 MG/ML SOLN COMPARISON:  Chest x-ray February 28, 2018 FINDINGS: Cardiovascular:  The thoracic aorta is normal with no atherosclerosis, aneurysm, or dissection. The heart size is normal as well. Evaluation of the pulmonary arteries is limited due  to timing of contrast. No pulmonary emboli in the main pulmonary arteries. Significant artifact is seen beyond the level the main pulmonary arteries. Multiple potential filling defects in the pulmonary arteries are largely explained by stairstep artifact. There are few the cannot be definitively described as artifactual such as in the right upper lobe on series 5, image 80. Mediastinum/Nodes: No enlarged mediastinal, hilar, or axillary lymph nodes. Thyroid gland, trachea, and esophagus demonstrate no significant findings. Lungs/Pleura: Central airways are normal. No pneumothorax. Patchy ground-glass opacities in the right middle lobe on series 6, image 47 and in the right lower lobe on series 6, image 54. No suspicious nodules or masses. No other infiltrates. Upper Abdomen: Previous cholecystectomy. Musculoskeletal: No chest wall abnormality. No acute or significant osseous findings. Review of the MIP images confirms the above findings. IMPRESSION: 1. The study is limited in its evaluation for pulmonary emboli due to respiratory motion and timing of contrast. Significant stairstep artifact is noted. While most of the potential filling defects in the pulmonary arteries can be confidently described as artifactual, at least 1 in the left lung is not definitively due to artifact. This defect could represent artifact or a small pulmonary embolus. A repeat CTA when the patient is better able to follow breathing instructions would help to further evaluate. 2. Patchy ground-glass opacities in the right middle and lower lobes consistent with an infectious or inflammatory process. Recommend follow-up to resolution. 3. No other abnormalities. Electronically Signed   By: Dorise Bullion III M.D   On: 03/01/2018 02:35    EKG:   Orders placed or performed during  the hospital encounter of 02/28/18  . EKG 12-Lead  . EKG 12-Lead  . ED EKG within 10 minutes  . ED EKG within 10 minutes  . EKG    ASSESSMENT AND PLAN:   27 year old female with no significant past medical history presents to hospital secondary to worsening shortness of breath  1.  Acute hypoxic respiratory distress-secondary to pneumonitis in the right middle and lower lobes noted. -CT angiogram concerning for possible pulmonary embolism versus artifact. -Patient is not hypoxic, continue current management and repeat CT angiogram if needed -continue IV steroids and nebulizer treatments for bronchospasm -Cough meds added -Continue antibiotics with Rocephin and azithromycin at this time  2.  Mild intermittent asthma-well-controlled as a child.  Continue to monitor.  3.  DVT prophylaxis-Lovenox  4. Leukocytosis- secondary to infec and steroids     All the records are reviewed and case discussed with Care Management/Social Workerr. Management plans discussed with the patient, family and they are in agreement.  CODE STATUS: Full code  TOTAL TIME TAKING CARE OF THIS PATIENT: 38 minutes.   POSSIBLE D/C IN 1-2 DAYS, DEPENDING ON CLINICAL CONDITION.   Gladstone Lighter M.D on 03/02/2018 at 11:49 AM  Between 7am to 6pm - Pager - [phone]  After 6pm go to [email or web address] - password [email or web address] [facility]  Office  [phone]  CC: Primary care physician; Patient, No Pcp Per

## 2018-03-03 LAB — CBC
HCT: 36.9 % (ref 36.0–46.0)
Hemoglobin: 11.5 g/dL — ABNORMAL LOW (ref 12.0–15.0)
MCH: 27.3 pg (ref 26.0–34.0)
MCHC: 31.2 g/dL (ref 30.0–36.0)
MCV: 87.4 fL (ref 80.0–100.0)
PLATELETS: 560 10*3/uL — AB (ref 150–400)
RBC: 4.22 MIL/uL (ref 3.87–5.11)
RDW: 12.8 % (ref 11.5–15.5)
WBC: 14.7 10*3/uL — ABNORMAL HIGH (ref 4.0–10.5)
nRBC: 0 % (ref 0.0–0.2)

## 2018-03-03 MED ORDER — ALBUTEROL SULFATE HFA 108 (90 BASE) MCG/ACT IN AERS: INHALATION_SPRAY | RESPIRATORY_TRACT | 1 refills | Status: AC

## 2018-03-03 MED ORDER — BENZONATATE 200 MG PO CAPS: 200.0000 mg | ORAL_CAPSULE | Freq: Three times a day (TID) | ORAL | 0 refills | Status: DC

## 2018-03-03 MED ORDER — PREDNISONE 20 MG PO TABS: 40.0000 mg | ORAL_TABLET | Freq: Every day | ORAL | 0 refills | Status: AC

## 2018-03-03 MED ORDER — LEVOFLOXACIN 500 MG PO TABS: 500.0000 mg | ORAL_TABLET | Freq: Every day | ORAL | 0 refills | Status: DC

## 2018-03-03 MED ORDER — MOMETASONE FURO-FORMOTEROL FUM 200-5 MCG/ACT IN AERO
2.0000 | INHALATION_SPRAY | Freq: Two times a day (BID) | RESPIRATORY_TRACT | Status: DC
  Administered 2018-03-03: 2 via RESPIRATORY_TRACT
  Filled 2018-03-03: qty 8.8

## 2018-03-03 NOTE — Progress Notes (Signed)
Pt ready for discharge home today per MD. Patient assessment unchanged from this morning. Given to dulera inhaler. Unit secretary called [facility] (at pt's request) to establish care- soonest appt is 12/17. Swot RN reviewed discharge instructions and prescriptions with pt and mom; all questions answered and pt verbalized understanding. PIV removed, VSS. Pt assisted to car.  Jacqueline Fields

## 2018-03-03 NOTE — Progress Notes (Signed)
Patient is agreeable to go to follow up apportionment with [facility] in [place]. RN aware of above.   [employer], LCSW [phone]

## 2018-03-03 NOTE — Progress Notes (Signed)
D/C instructions given. Patient verbalized information back. All questions answered. Educations on medications provided. ABC intact. No further needs.  D/C home with mother (at bedside). Volunteer services called for w/c

## 2018-03-03 NOTE — Progress Notes (Signed)
     Jacqueline Fields was admitted to the [facility] on 02/28/2018 for an acute medical condition and is being Discharged on  03/03/2018 .  She will need another 4-5 days for recovery of her respiratory status  and so advised to stay away from work until then. So please excuse her from work for the above  Days. Should be able to return to work without any restrictions from 03/14/18.  Call Gladstone Lighter  MD, [facility] at  [phone] with questions.  Gladstone Lighter M.D on 03/03/2018,at 11:06 AM  [facility] [address] [place] [address]

## 2018-03-04 NOTE — Discharge Summary (Signed)
[facility] at [place] NAME: Jacqueline Fields    MR#:  [id number]  DATE OF BIRTH:  04-29-1990  DATE OF ADMISSION:  02/28/2018   ADMITTING PHYSICIAN: Harrie Foreman, MD  DATE OF DISCHARGE: 03/03/2018 12:45 PM  PRIMARY CARE PHYSICIAN: Patient, No Pcp Per   ADMISSION DIAGNOSIS:   Community acquired pneumonia, unspecified laterality [J18.9] Sepsis, due to unspecified organism, unspecified whether acute organ dysfunction present ([place]) [A41.9]  DISCHARGE DIAGNOSIS:   Active Problems:   CAP (community acquired pneumonia)   SECONDARY DIAGNOSIS:   Past Medical History:  Diagnosis Date  . Allergy   . Anemia     HOSPITAL COURSE:   27 year old female with no significant past medical history presents to hospital secondary to worsening shortness of breath  1.  Acute hypoxic respiratory distress-secondary to pneumonitis in the right middle and lower lobes noted. -CT angiogram concerning for possible pulmonary embolism versus artifact.  Repeat CT angiogram with no PE noted -Symptoms significantly improved on steroids, nebs and antibiotics -Discharged on Levaquin, prednisone taper and cough meds.  2.  Mild intermittent asthma-well-controlled as a child.  Continue to monitor.  Ambulating well and doing well.  Will be discharged home today  DISCHARGE CONDITIONS:   Guarded  CONSULTS OBTAINED:   None  DRUG ALLERGIES:   No Known Allergies DISCHARGE MEDICATIONS:   Allergies as of 03/03/2018   No Known Allergies     Medication List    STOP taking these medications   norelgestromin-ethinyl estradiol 150-35 MCG/24HR transdermal patch Commonly known as:  ORTHO EVRA     TAKE these medications   albuterol 108 (90 Base) MCG/ACT inhaler Commonly known as:  PROVENTIL HFA;VENTOLIN HFA Inhale 2-4 puffs by mouth every 4 hours as needed for wheezing, cough, and/or shortness of breath   benzonatate 200 MG capsule Commonly known as:   TESSALON Take 1 capsule (200 mg total) by mouth 3 (three) times daily.   cetirizine 10 MG tablet Commonly known as:  ZYRTEC Take 10 mg by mouth daily.   EPIPEN 2-PAK 0.3 mg/0.3 mL Soaj injection Generic drug:  EPINEPHrine Inject 1 mg into the muscle as needed. Reported on 07/05/2015   ferrous sulfate 325 (65 FE) MG tablet Take 1 tablet (325 mg total) by mouth 2 (two) times daily with a meal.   HYDROcodone-homatropine 5-1.5 MG/5ML syrup Commonly known as:  HYCODAN Take 5 mLs by mouth every 6 (six) hours as needed for cough.   levofloxacin 500 MG tablet Commonly known as:  LEVAQUIN Take 1 tablet (500 mg total) by mouth daily.   predniSONE 20 MG tablet Commonly known as:  DELTASONE Take 2 tablets (40 mg total) by mouth daily for 5 days.        DISCHARGE INSTRUCTIONS:   1.  PCP follow-up in 1 to 2 weeks  DIET:   Regular diet  ACTIVITY:   Activity as tolerated  OXYGEN:   Home Oxygen: No.  Oxygen Delivery: room air  DISCHARGE LOCATION:   home   If you experience worsening of your admission symptoms, develop shortness of breath, life threatening emergency, suicidal or homicidal thoughts you must seek medical attention immediately by calling 911 or calling your MD immediately  if symptoms less severe.  You Must read complete instructions/literature along with all the possible adverse reactions/side effects for all the Medicines you take and that have been prescribed to you. Take any new Medicines after you have completely understood and accpet all the possible  adverse reactions/side effects.   Please note  You were cared for by a hospitalist during your hospital stay. If you have any questions about your discharge medications or the care you received while you were in the hospital after you are discharged, you can call the unit and asked to speak with the hospitalist on call if the hospitalist that took care of you is not available. Once you are discharged, your  primary care physician will handle any further medical issues. Please note that NO REFILLS for any discharge medications will be authorized once you are discharged, as it is imperative that you return to your primary care physician (or establish a relationship with a primary care physician if you do not have one) for your aftercare needs so that they can reassess your need for medications and monitor your lab values.    On the day of Discharge:  VITAL SIGNS:   Blood pressure 113/72, pulse 79, temperature (!) 97.5 F (36.4 C), temperature source Oral, resp. rate 16, height 5\' 7"  (1.702 m), weight 105.9 kg, last menstrual period 02/11/2018, SpO2 98 %, unknown if currently breastfeeding.  PHYSICAL EXAMINATION:   GENERAL:  27 y.o.-year-old obese patient lying in the bed with no acute distress.  EYES: Pupils equal, round, reactive to light and accommodation. No scleral icterus. Extraocular muscles intact.  HEENT: Head atraumatic, normocephalic. Oropharynx and nasopharynx clear.  NECK:  Supple, no jugular venous distention. No thyroid enlargement, no tenderness.  LUNGS: moving air bilaterally, much improved expiratory wheeze,  no rales,rhonchi or crepitation. No use of accessory muscles of respiration.  CARDIOVASCULAR: S1, S2 normal. No murmurs, rubs, or gallops.  ABDOMEN: Soft, nontender, nondistended. Bowel sounds present. No organomegaly or mass.  EXTREMITIES: No pedal edema, cyanosis, or clubbing.  NEUROLOGIC: Cranial nerves II through XII are intact. Muscle strength 5/5 in all extremities. Sensation intact. Gait not checked.  PSYCHIATRIC: The patient is alert and oriented x 3.  SKIN: No obvious rash, lesion, or ulcer.    DATA REVIEW:   CBC Recent Labs  Lab 03/03/18 0302  WBC 14.7*  HGB 11.5*  HCT 36.9  PLT 560*    Chemistries  Recent Labs  Lab 03/02/18 0325  NA 142  K 4.2  CL 110  CO2 25  GLUCOSE 137*  BUN 7  CREATININE 0.43*  CALCIUM 8.8*     Microbiology Results    Results for orders placed or performed during the hospital encounter of 02/28/18  Blood Culture (routine x 2)     Status: None (Preliminary result)   Collection Time: 03/01/18  3:42 AM  Result Value Ref Range Status   Specimen Description BLOOD LEFT ANTECUBITAL  Final   Special Requests   Final    BOTTLES DRAWN AEROBIC AND ANAEROBIC Blood Culture results may not be optimal due to an excessive volume of blood received in culture bottles   Culture   Final    NO GROWTH 3 DAYS Performed at [facility], [address] [place], [place] [address]    Report Status PENDING  Incomplete  Blood Culture (routine x 2)     Status: None (Preliminary result)   Collection Time: 03/01/18  3:43 AM  Result Value Ref Range Status   Specimen Description BLOOD BLOOD LEFT HAND  Final   Special Requests   Final    BOTTLES DRAWN AEROBIC AND ANAEROBIC Blood Culture results may not be optimal due to an excessive volume of blood received in culture bottles   Culture  Final    NO GROWTH 3 DAYS Performed at [facility], [address] [place], [place] [address]    Report Status PENDING  Incomplete  Urine culture     Status: Abnormal   Collection Time: 03/01/18  4:09 AM  Result Value Ref Range Status   Specimen Description   Final    URINE, RANDOM Performed at [facility], [address], [place], [place] [address]    Special Requests   Final    NONE Performed at [facility], [address] [place], [place] [address]    Culture MULTIPLE SPECIES PRESENT, SUGGEST RECOLLECTION (A)  Final   Report Status 03/02/2018 FINAL  Final    RADIOLOGY:  No results found.   Management plans discussed with the patient, family and they are in agreement.  CODE STATUS:  Code Status History    Date Active Date Inactive Code Status Order ID Comments User Context   03/01/2018 0458 03/03/2018 1553 Full Code [id number]  Harrie Foreman, MD ED   11/14/2015 1929 11/15/2015 1956  Full Code [id number]  Clayburn Pert, MD Inpatient   07/10/2015 1523 07/11/2015 2026 Full Code [id number]  Dalia Heading, [place] Inpatient   07/10/2015 0642 07/10/2015 1523 Full Code [id number]  Gae Dry, MD Inpatient   07/05/2015 0850 07/05/2015 1401 Full Code [id number]  Rod Can, CNM Inpatient   06/14/2015 1149 06/14/2015 1522 Full Code [id number]  Aletha Halim, MD Inpatient      TOTAL TIME TAKING CARE OF THIS PATIENT: 38 minutes.    Gladstone Lighter M.D on 03/04/2018 at 4:09 PM  Between 7am to 6pm - Pager - [phone]  After 6pm go to [email or web address] - Proofreader  [facility]  Office  [phone]  CC: Primary care physician; Patient, No Pcp Per   Note: This dictation was prepared with Dragon dictation along with smaller phrase technology. Any transcriptional errors that result from this process are unintentional.

## 2018-03-06 LAB — CULTURE, BLOOD (ROUTINE X 2)
CULTURE: NO GROWTH
CULTURE: NO GROWTH

## 2018-03-29 ENCOUNTER — Ambulatory Visit: Payer: BLUE CROSS/BLUE SHIELD | Admitting: Family Medicine

## 2018-04-01 ENCOUNTER — Ambulatory Visit: Payer: BLUE CROSS/BLUE SHIELD | Admitting: Family Medicine

## 2018-04-01 ENCOUNTER — Other Ambulatory Visit (HOSPITAL_COMMUNITY)
Admission: RE | Admit: 2018-04-01 | Discharge: 2018-04-01 | Disposition: A | Discharge status: Home / Self Care | Payer: BLUE CROSS/BLUE SHIELD | Source: Ambulatory Visit | Attending: Family Medicine | Admitting: Family Medicine

## 2018-04-01 ENCOUNTER — Encounter: Payer: Self-pay | Admitting: Family Medicine

## 2018-04-01 VITALS — BP 120/76 | HR 88 | Temp 98.3°F | Ht 67.0 in | Wt 229.4 lb

## 2018-04-01 DIAGNOSIS — E6609 Other obesity due to excess calories: Secondary | ICD-10-CM | POA: Diagnosis not present

## 2018-04-01 DIAGNOSIS — A419 Sepsis, unspecified organism: Secondary | ICD-10-CM | POA: Diagnosis not present

## 2018-04-01 DIAGNOSIS — Z113 Encounter for screening for infections with a predominantly sexual mode of transmission: Secondary | ICD-10-CM | POA: Insufficient documentation

## 2018-04-01 DIAGNOSIS — Z833 Family history of diabetes mellitus: Secondary | ICD-10-CM

## 2018-04-01 DIAGNOSIS — Z6835 Body mass index (BMI) 35.0-35.9, adult: Secondary | ICD-10-CM | POA: Diagnosis not present

## 2018-04-01 DIAGNOSIS — Z13220 Encounter for screening for lipoid disorders: Secondary | ICD-10-CM

## 2018-04-01 DIAGNOSIS — J189 Pneumonia, unspecified organism: Secondary | ICD-10-CM | POA: Diagnosis not present

## 2018-04-01 DIAGNOSIS — Z23 Encounter for immunization: Secondary | ICD-10-CM | POA: Diagnosis not present

## 2018-04-01 NOTE — Addendum Note (Signed)
Addended by: Hubbard Hartshorn on: 04/01/2018 03:21 PM   Modules accepted: Orders

## 2018-04-01 NOTE — Patient Instructions (Signed)
Community-Acquired Pneumonia, Adult  Pneumonia is an infection of the lungs. It causes swelling in the airways of the lungs. Mucus and fluid may also build up inside the airways.  One type of pneumonia can happen while a person is in a hospital. A different type can happen when a person is not in a hospital (community-acquired pneumonia).   What are the causes?    This condition is caused by germs (viruses, bacteria, or fungi). Some types of germs can be passed from one person to another. This can happen when you breathe in droplets from the cough or sneeze of an infected person.  What increases the risk?  You are more likely to develop this condition if you:   Have a long-term (chronic) disease, such as:  ? Chronic obstructive pulmonary disease (COPD).  ? Asthma.  ? Cystic fibrosis.  ? Congestive heart failure.  ? Diabetes.  ? Kidney disease.   Have HIV.   Have sickle cell disease.   Have had your spleen removed.   Do not take good care of your teeth and mouth (poor dental hygiene).   Have a medical condition that increases the risk of breathing in droplets from your own mouth and nose.   Have a weakened body defense system (immune system).   Are a smoker.   Travel to areas where the germs that cause this illness are common.   Are around certain animals or the places they live.  What are the signs or symptoms?   A dry cough.   A wet (productive) cough.   Fever.   Sweating.   Chest pain. This often happens when breathing deeply or coughing.   Fast breathing or trouble breathing.   Shortness of breath.   Shaking chills.   Feeling tired (fatigue).   Muscle aches.  How is this treated?  Treatment for this condition depends on many things. Most adults can be treated at home. In some cases, treatment must happen in a hospital. Treatment may include:   Medicines given by mouth or through an IV tube.   Being given extra oxygen.   Respiratory therapy.  In rare cases, treatment for very bad pneumonia  may include:   Using a machine to help you breathe.   Having a procedure to remove fluid from around your lungs.  Follow these instructions at home:  Medicines   Take over-the-counter and prescription medicines only as told by your doctor.  ? Only take cough medicine if you are losing sleep.   If you were prescribed an antibiotic medicine, take it as told by your doctor. Do not stop taking the antibiotic even if you start to feel better.  General instructions     Sleep with your head and neck raised (elevated). You can do this by sleeping in a recliner or by putting a few pillows under your head.   Rest as needed. Get at least 8 hours of sleep each night.   Drink enough water to keep your pee (urine) pale yellow.   Eat a healthy diet that includes plenty of vegetables, fruits, whole grains, low-fat dairy products, and lean protein.   Do not use any products that contain nicotine or tobacco. These include cigarettes, e-cigarettes, and chewing tobacco. If you need help quitting, ask your doctor.   Keep all follow-up visits as told by your doctor. This is important.  How is this prevented?  A shot (vaccine) can help prevent pneumonia. Shots are often suggested for:   People   older than 26 years of age.   People older than 26 years of age who:  ? Are having cancer treatment.  ? Have long-term (chronic) lung disease.  ? Have problems with their body's defense system.  You may also prevent pneumonia if you take these actions:   Get the flu (influenza) shot every year.   Go to the dentist as often as told.   Wash your hands often. If you cannot use soap and water, use hand sanitizer.  Contact a doctor if:   You have a fever.   You lose sleep because your cough medicine does not help.  Get help right away if:   You are short of breath and it gets worse.   You have more chest pain.   Your sickness gets worse. This is very serious if:  ? You are an older adult.  ? Your body's defense system is weak.   You  cough up blood.  Summary   Pneumonia is an infection of the lungs.   Most adults can be treated at home. Some will need treatment in a hospital.   Drink enough water to keep your pee pale yellow.   Get at least 8 hours of sleep each night.  This information is not intended to replace advice given to you by your health care provider. Make sure you discuss any questions you have with your health care provider.  Document Released: 09/16/2007 Document Revised: 11/25/2017 Document Reviewed: 11/25/2017  Elsevier Interactive Patient Education  2019 [employer]

## 2018-04-01 NOTE — Progress Notes (Signed)
Name: Jacqueline Fields   MRN: [id number]    DOB: 02-24-1991   Date:04/01/2018       Progress Note  Subjective  Chief Complaint  Chief Complaint  Patient presents with  . New Patient (Initial Visit)    HPI  Pt presents for hospital admission follow up for CAP with Sepsis/Acute hypoxic respiratory distress and to establish care - she was admitted for sepsis and CAP at [facility] from 02/28/18 - 03/03/18. Was discharged on prednisone, Levaquin, and cough medications.  She does have hx asthma as a child, but this had been previously well-controlled; never smoker, no second-hand smoke exposure.  - Shortness of breath on exertion only - says she feels about 90% better. She denies fevers or chills, no chest pain. Completed course of PO levaquin and prednisone.  She is no longer having cough - no cough at night either.  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  She requests FMLA to be completed for the dates that she was hospitalized.  She also took until December 2nd per Hospitalist's recommendation.  We will complete this paperwork for her for these dates.   Irregular Periods - Used to see Dr. Glennon Mac [facility] - she was on a the patch because she wasn't having periods, was taken off while admitted due to concern for possible PE (later determined that this concerned was no longer present on CT scan).  She is now having periods that are very light and last for about 3 days - LMP was 03/28/2018.  She will contact Dr. Glennon Mac for follow up to discuss alternative options.   Obesity: She is not able to exercise right now, but does play with her young children frequently.  Her diet is limited due to still living with her parents while her home is renovated.  She is looking forward to moving out to be able to have healthier options around.  Health Maintenance: Needs STI screening; TDAP due today  Patient Active Problem List   Diagnosis Date Noted  . CAP  (community acquired pneumonia) 03/01/2018  . Oligomenorrhea, unspecified 11/17/2017  . Cholecystitis 11/14/2015  . RUQ abdominal pain   . Normal labor 07/10/2015  . Irregular menses 11/30/2012  . Menstrual cramps 11/30/2012  . Routine general medical examination at a health care facility 10/11/2012  . Encounter for routine gynecological examination 10/11/2012  . Tremors of nervous system 08/27/2011  . Heart palpitations 08/27/2011  . Asthma 08/27/2011  . Anemia   . Allergy   . Occasional tremors 07/17/2011  . Stuttering 07/17/2011    Past Surgical History:  Procedure Laterality Date  . ADENOIDECTOMY    . CHOLECYSTECTOMY N/A 11/15/2015   Procedure: LAPAROSCOPIC CHOLECYSTECTOMY WITH INTRAOPERATIVE CHOLANGIOGRAM;  Surgeon: Clayburn Pert, MD;  Location: [facility] ORS;  Service: General;  Laterality: N/A;  . COLONOSCOPY WITH PROPOFOL N/A 05/11/2017   Procedure: COLONOSCOPY WITH PROPOFOL;  Surgeon: Jonathon Bellows, MD;  Location: [facility] ENDOSCOPY;  Service: Gastroenterology;  Laterality: N/A;  . ESOPHAGOGASTRODUODENOSCOPY (EGD) WITH PROPOFOL N/A 05/11/2017   Procedure: ESOPHAGOGASTRODUODENOSCOPY (EGD) WITH PROPOFOL;  Surgeon: Jonathon Bellows, MD;  Location: [facility] ENDOSCOPY;  Service: Gastroenterology;  Laterality: N/A;    Family History  Problem Relation Age of Onset  . Diabetes Paternal Grandmother   . Diabetes Father   . Cancer Maternal Grandfather        Pancreatic cancer, passed away froms stroke.  . Stroke Maternal Grandfather   . Heart  attack Paternal Grandfather   . Breast cancer Maternal Aunt   . Breast cancer Maternal Aunt   . Bladder Cancer Neg Hx   . Kidney cancer Neg Hx     Social History   Socioeconomic History  . Marital status: Single    Spouse name: Not on file  . Number of children: 2  . Years of education: 71  . Highest education level: Some college, no degree  Occupational History  . Not on file  Social Needs  . Financial resource strain: Not hard at all  . Food  insecurity:    Worry: Never true    Inability: Never true  . Transportation needs:    Medical: No    Non-medical: No  Tobacco Use  . Smoking status: Never Smoker  . Smokeless tobacco: Never Used  Substance and Sexual Activity  . Alcohol use: No  . Drug use: No  . Sexual activity: Yes    Partners: Male    Birth control/protection: None  Lifestyle  . Physical activity:    Days per week: 0 days    Minutes per session: 0 min  . Stress: Not at all  Relationships  . Social connections:    Talks on phone: More than three times a week    Gets together: More than three times a week    Attends religious service: More than 4 times per year    Active member of club or organization: No    Attends meetings of clubs or organizations: Never    Relationship status: Never married  . Intimate partner violence:    Fear of current or ex partner: No    Emotionally abused: No    Physically abused: No    Forced sexual activity: No  Other Topics Concern  . Not on file  Social History Narrative  . Not on file    Current Outpatient Medications:  .  albuterol (PROVENTIL HFA;VENTOLIN HFA) 108 (90 Base) MCG/ACT inhaler, Inhale 2-4 puffs by mouth every 4 hours as needed for wheezing, cough, and/or shortness of breath, Disp: 1 Inhaler, Rfl: 1 .  cetirizine (ZYRTEC) 10 MG tablet, Take 10 mg by mouth daily., Disp: , Rfl:  .  Cyanocobalamin (B-12 PO), Take 1 tablet by mouth daily., Disp: , Rfl:  .  EPIPEN 2-PAK 0.3 MG/0.3ML SOAJ injection, Inject 1 mg into the muscle as needed. Reported on 07/05/2015, Disp: , Rfl:  .  ferrous sulfate 325 (65 FE) MG tablet, Take 1 tablet (325 mg total) by mouth 2 (two) times daily with a meal., Disp: 60 tablet, Rfl: 2  No Known Allergies  I personally reviewed active problem list, medication list, allergies, notes from last encounter, lab results with the patient/caregiver today.  ROS Constitutional: Negative for fever or weight change.  Respiratory: Negative for  cough; see HPI   Cardiovascular: Negative for chest pain or palpitations.  Gastrointestinal: Negative for abdominal pain, no bowel changes.  Musculoskeletal: Negative for gait problem or joint swelling.  Skin: Negative for rash.  Neurological: Negative for dizziness or headache.  No other specific complaints in a complete review of systems (except as listed in HPI above).  Objective  Vitals:   04/01/18 1242  BP: 120/76  Pulse: 88  Temp: 98.3 F (36.8 C)  SpO2: 99%  Weight: 229 lb 6.4 oz (104.1 kg)  Height: 5\' 7"  (1.702 m)   Body mass index is 35.93 kg/m.  Physical Exam  Constitutional: Patient appears well-developed and well-nourished. No distress.  HENT:  Head: Normocephalic and atraumatic. Ears: bilateral TMs with no erythema or effusion; Nose: Nose normal. Mouth/Throat: Oropharynx is clear and moist. No oropharyngeal exudate or tonsillar swelling.  Eyes: Conjunctivae and EOM are normal. No scleral icterus.  Pupils are equal, round, and reactive to light.  Neck: Normal range of motion. Neck supple. No JVD present. No thyromegaly present.  Cardiovascular: Normal rate, regular rhythm and normal heart sounds.  No murmur heard. No BLE edema. Pulmonary/Chest: Effort normal and breath sounds normal. No respiratory distress. Abdominal: Soft. Bowel sounds are normal, no distension. There is no tenderness. No masses. Musculoskeletal: Normal range of motion, no joint effusions. No gross deformities Neurological: Pt is alert and oriented to person, place, and time. No cranial nerve deficit. Coordination, balance, strength, speech and gait are normal.  Skin: Skin is warm and dry. No rash noted. No erythema.  Psychiatric: Patient has a normal mood and affect. behavior is normal. Judgment and thought content normal.  No results found for this or any previous visit (from the past 72 hour(s)).  PHQ2/9: Depression screen [facility] 2/9 04/01/2018 11/17/2017  Decreased Interest 0 0  Down, Depressed,  Hopeless 0 0  PHQ - 2 Score 0 0  Altered sleeping 0 0  Tired, decreased energy 0 1  Change in appetite 0 0  Feeling bad or failure about yourself  0 0  Trouble concentrating 0 0  Moving slowly or fidgety/restless 0 1  Suicidal thoughts 0 0  PHQ-9 Score 0 2  Difficult doing work/chores Not difficult at all Not difficult at all   Assessment & Plan  1. Community acquired pneumonia, unspecified laterality - Improving; advised that if not improving in 1 month, we will repeat CT angio and refer to pulmonology.  - COMPLETE METABOLIC PANEL WITH GFR - CBC w/Diff/Platelet  2. Sepsis without acute organ dysfunction, due to unspecified organism (HCC) - COMPLETE METABOLIC PANEL WITH GFR - POCT glycosylated hemoglobin (Hb A1C)  3. Class 2 obesity due to excess calories without serious comorbidity with body mass index (BMI) of 35.0 to 35.9 in adult - Discussed importance of 150 minutes of physical activity weekly, eat two servings of fish weekly, eat one serving of tree nuts ( cashews, pistachios, pecans, almonds.Marland Kitchen) every other day, eat 6 servings of fruit/vegetables daily and drink plenty of water and avoid sweet beverages.  - COMPLETE METABOLIC PANEL WITH GFR - POCT glycosylated hemoglobin (Hb A1C) - Lipid panel  4. Lipid screening - Lipid panel  5. Routine screening for STI (sexually transmitted infection) - HIV Antibody (routine testing w rflx) - RPR - Cervicovaginal ancillary only  6. Need for Tdap vaccination - Tdap vaccine greater than or equal to 7yo IM  7. Family history of diabetes mellitus - COMPLETE METABOLIC PANEL WITH GFR - POCT glycosylated hemoglobin (Hb A1C)

## 2018-04-04 LAB — CERVICOVAGINAL ANCILLARY ONLY
CHLAMYDIA, DNA PROBE: NEGATIVE
Neisseria Gonorrhea: NEGATIVE

## 2018-04-05 LAB — COMPLETE METABOLIC PANEL WITH GFR
AG RATIO: 1.4 (calc) (ref 1.0–2.5)
ALBUMIN MSPROF: 4.2 g/dL (ref 3.6–5.1)
ALT: 20 U/L (ref 6–29)
AST: 21 U/L (ref 10–30)
Alkaline phosphatase (APISO): 74 U/L (ref 33–115)
BILIRUBIN TOTAL: 0.2 mg/dL (ref 0.2–1.2)
BUN: 8 mg/dL (ref 7–25)
CALCIUM: 9.7 mg/dL (ref 8.6–10.2)
CHLORIDE: 104 mmol/L (ref 98–110)
CO2: 27 mmol/L (ref 20–32)
Creat: 0.69 mg/dL (ref 0.50–1.10)
GFR, EST AFRICAN AMERICAN: 139 mL/min/{1.73_m2} (ref >=60)
GFR, EST NON AFRICAN AMERICAN: 120 mL/min/{1.73_m2} (ref >=60)
GLOBULIN: 2.9 g/dL (ref 1.9–3.7)
Glucose, Bld: 80 mg/dL (ref 65–99)
POTASSIUM: 4.6 mmol/L (ref 3.5–5.3)
SODIUM: 140 mmol/L (ref 135–146)
TOTAL PROTEIN: 7.1 g/dL (ref 6.1–8.1)

## 2018-04-05 LAB — CBC WITH DIFFERENTIAL/PLATELET
Absolute Monocytes: 428 cells/uL (ref 200–950)
BASOS ABS: 76 {cells}/uL (ref 0–200)
Basophils Relative: 0.8 %
EOS ABS: 447 {cells}/uL (ref 15–500)
EOS PCT: 4.7 %
HCT: 36.8 % (ref 35.0–45.0)
HEMOGLOBIN: 11.7 g/dL (ref 11.7–15.5)
Lymphs Abs: 3059 cells/uL (ref 850–3900)
MCH: 26.8 pg — AB (ref 27.0–33.0)
MCHC: 31.8 g/dL — AB (ref 32.0–36.0)
MCV: 84.4 fL (ref 80.0–100.0)
MONOS PCT: 4.5 %
MPV: 8.8 fL (ref 7.5–12.5)
Neutro Abs: 5491 cells/uL (ref 1500–7800)
Neutrophils Relative %: 57.8 %
Platelets: 586 10*3/uL — ABNORMAL HIGH (ref 140–400)
RBC: 4.36 10*6/uL (ref 3.80–5.10)
RDW: 12.6 % (ref 11.0–15.0)
Total Lymphocyte: 32.2 %
WBC: 9.5 10*3/uL (ref 3.8–10.8)

## 2018-04-05 LAB — LIPID PANEL
CHOL/HDL RATIO: 4.4 (calc) (ref <5.0)
CHOLESTEROL: 203 mg/dL — AB (ref <200)
HDL: 46 mg/dL — ABNORMAL LOW (ref >50)
LDL CHOLESTEROL (CALC): 133 mg/dL — AB
Non-HDL Cholesterol (Calc): 157 mg/dL (calc) — ABNORMAL HIGH (ref <130)
Triglycerides: 129 mg/dL (ref <150)

## 2018-04-05 LAB — TEST AUTHORIZATION

## 2018-04-05 LAB — TEST AUTHORIZATION 2

## 2018-04-05 LAB — HIV ANTIBODY (ROUTINE TESTING W REFLEX): HIV 1&2 Ab, 4th Generation: NONREACTIVE

## 2018-04-05 LAB — HEMOGLOBIN A1C W/OUT EAG: HEMOGLOBIN A1C: 5.1 %{Hb} (ref <5.7)

## 2018-04-05 LAB — RPR: RPR Ser Ql: NONREACTIVE

## 2018-04-08 ENCOUNTER — Encounter: Payer: Self-pay | Admitting: Family Medicine

## 2018-04-13 NOTE — L&D Delivery Note (Signed)
Delivery Note At 3:35 AM a viable female was delivered via Vaginal, Spontaneous (Presentation:ROA).  APGAR: 9, 9; weight pending.   Placenta status:spontanous, intact.  Cord:  without complications: .  Cord pH: N/A  Anesthesia: Spinal   Episiotomy: None Lacerations: None Suture Repair: none Est. Blood Loss (mL): 140  Mom to postpartum.  Baby to Couplet care / Skin to Skin.  Malachy Mood 01/27/2019, 4:06 AM

## 2018-04-27 ENCOUNTER — Encounter: Payer: Self-pay | Admitting: Family Medicine

## 2018-04-27 DIAGNOSIS — D696 Thrombocytopenia, unspecified: Secondary | ICD-10-CM

## 2018-04-28 NOTE — Addendum Note (Signed)
Addended by: Hubbard Hartshorn on: 04/28/2018 08:19 AM   Modules accepted: Orders

## 2018-05-04 ENCOUNTER — Inpatient Hospital Stay: Payer: BLUE CROSS/BLUE SHIELD | Admitting: *Deleted

## 2018-05-04 ENCOUNTER — Encounter: Payer: Self-pay | Admitting: Oncology

## 2018-05-04 ENCOUNTER — Inpatient Hospital Stay: Payer: BLUE CROSS/BLUE SHIELD | Attending: Oncology | Admitting: Oncology

## 2018-05-04 ENCOUNTER — Other Ambulatory Visit: Payer: Self-pay

## 2018-05-04 VITALS — BP 118/78 | HR 76 | Temp 96.4°F | Resp 18 | Ht 68.0 in | Wt 231.5 lb

## 2018-05-04 DIAGNOSIS — D508 Other iron deficiency anemias: Secondary | ICD-10-CM

## 2018-05-04 DIAGNOSIS — D75839 Thrombocytosis, unspecified: Secondary | ICD-10-CM

## 2018-05-04 DIAGNOSIS — D473 Essential (hemorrhagic) thrombocythemia: Secondary | ICD-10-CM | POA: Diagnosis not present

## 2018-05-04 DIAGNOSIS — E538 Deficiency of other specified B group vitamins: Secondary | ICD-10-CM | POA: Diagnosis not present

## 2018-05-04 DIAGNOSIS — K929 Disease of digestive system, unspecified: Secondary | ICD-10-CM

## 2018-05-04 DIAGNOSIS — D509 Iron deficiency anemia, unspecified: Secondary | ICD-10-CM | POA: Diagnosis not present

## 2018-05-04 HISTORY — DX: Iron deficiency anemia, unspecified: D50.9

## 2018-05-04 LAB — CBC WITH DIFFERENTIAL/PLATELET
Abs Immature Granulocytes: 0.03 10*3/uL (ref 0.00–0.07)
BASOS PCT: 1 %
Basophils Absolute: 0.1 10*3/uL (ref 0.0–0.1)
EOS ABS: 0.5 10*3/uL (ref 0.0–0.5)
EOS PCT: 5 %
HEMATOCRIT: 37.7 % (ref 36.0–46.0)
Hemoglobin: 12.1 g/dL (ref 12.0–15.0)
IMMATURE GRANULOCYTES: 0 %
LYMPHS ABS: 2.9 10*3/uL (ref 0.7–4.0)
Lymphocytes Relative: 29 %
MCH: 27.1 pg (ref 26.0–34.0)
MCHC: 32.1 g/dL (ref 30.0–36.0)
MCV: 84.5 fL (ref 80.0–100.0)
MONOS PCT: 5 %
Monocytes Absolute: 0.5 10*3/uL (ref 0.1–1.0)
NEUTROS PCT: 60 %
NRBC: 0 % (ref 0.0–0.2)
Neutro Abs: 5.8 10*3/uL (ref 1.7–7.7)
Platelets: 489 10*3/uL — ABNORMAL HIGH (ref 150–400)
RBC: 4.46 MIL/uL (ref 3.87–5.11)
RDW: 13.2 % (ref 11.5–15.5)
WBC: 9.8 10*3/uL (ref 4.0–10.5)

## 2018-05-04 LAB — RETIC PANEL
IMMATURE RETIC FRACT: 5.5 % (ref 2.3–15.9)
RBC.: 4.46 MIL/uL (ref 3.87–5.11)
RETIC COUNT ABSOLUTE: 57.5 10*3/uL (ref 19.0–186.0)
RETIC CT PCT: 1.3 % (ref 0.4–3.1)
Reticulocyte Hemoglobin: 30.4 pg (ref >27.9)

## 2018-05-04 LAB — TECHNOLOGIST SMEAR REVIEW

## 2018-05-04 MED ORDER — FOLIC ACID 400 MCG PO TABS: 400.0000 ug | ORAL_TABLET | Freq: Every day | ORAL | 3 refills | Status: DC

## 2018-05-04 NOTE — Addendum Note (Signed)
Addended by: Mallie Snooks I on: 05/04/2018 11:42 AM   Modules accepted: Orders

## 2018-05-04 NOTE — Progress Notes (Signed)
Patient here for initial visit. Pt states she is more tired than she is used to.

## 2018-05-04 NOTE — Progress Notes (Signed)
Hematology/Oncology Consult note [facility] [phone] [phone]   Patient Care Team: Hubbard Hartshorn, FNP as PCP - General (Family Medicine)  REFERRING PROVIDER: Hubbard Hartshorn, FNP CHIEF COMPLAINTS/REASON FOR VISIT:  Evaluation of thrombocytosis  HISTORY OF PRESENTING ILLNESS:  Jacqueline Fields is a  28 y.o.  female with PMH listed below who was referred to me for evaluation of thromcbocytosis  Reviewed patient's labs which were obtained by PCP on 04/01/2018..  Labs showed elevated platelet counts at  wbc 9.5 and hemoglobin 11.7, MCV 84.  Platelet counts 586.  Normal differential.  Reviewed patient's previous labs. Thrombocytosis onset is chronic onset , duration since at least 2017. No aggravating or elevated factors. Associated symptoms or signs:  Denies weight loss, fever, chills, fatigue, night sweats.  Context:  Smoking history: Denies Family history of polycythemia.  Denies History of iron deficiency anemia: She has history of iron deficiency.  Has been taking ferrous sulfate 325 mg every night for the past 3 years.  Menstrual period is irregular, low volume flow. She has had upper endoscopy and colonoscopy done on 05/11/2017 for evaluation of possible malabsorption. Exam were normal except internal hemorrhoids nonbleeding.  Random duodenum biopsy was negative for intraepithelial lymphocytosis, dysplasia and malignancy. History of DVT denies  Also reports that she has always had multiple bowel movements daily, usually she needs to have a bowel movement after every meal.   Review of Systems  Constitutional: Negative for appetite change, chills, fatigue and fever.  HENT:   Negative for hearing loss and voice change.   Eyes: Negative for eye problems.  Respiratory: Negative for chest tightness and cough.   Cardiovascular: Negative for chest pain.  Gastrointestinal: Negative for abdominal distention, abdominal pain and blood in  stool.       Multiple bowel movements daily.  Endocrine: Negative for hot flashes.  Genitourinary: Negative for difficulty urinating and frequency.   Musculoskeletal: Negative for arthralgias.  Skin: Negative for itching and rash.  Neurological: Negative for extremity weakness.  Hematological: Negative for adenopathy.  Psychiatric/Behavioral: Negative for confusion.    MEDICAL HISTORY:  Past Medical History:  Diagnosis Date  . Allergy   . Anemia     SURGICAL HISTORY: Past Surgical History:  Procedure Laterality Date  . ADENOIDECTOMY    . CHOLECYSTECTOMY N/A 11/15/2015   Procedure: LAPAROSCOPIC CHOLECYSTECTOMY WITH INTRAOPERATIVE CHOLANGIOGRAM;  Surgeon: Clayburn Pert, MD;  Location: [facility] ORS;  Service: General;  Laterality: N/A;  . COLONOSCOPY WITH PROPOFOL N/A 05/11/2017   Procedure: COLONOSCOPY WITH PROPOFOL;  Surgeon: Jonathon Bellows, MD;  Location: [facility] ENDOSCOPY;  Service: Gastroenterology;  Laterality: N/A;  . ESOPHAGOGASTRODUODENOSCOPY (EGD) WITH PROPOFOL N/A 05/11/2017   Procedure: ESOPHAGOGASTRODUODENOSCOPY (EGD) WITH PROPOFOL;  Surgeon: Jonathon Bellows, MD;  Location: [facility] ENDOSCOPY;  Service: Gastroenterology;  Laterality: N/A;    SOCIAL HISTORY: Social History   Socioeconomic History  . Marital status: Single    Spouse name: Not on file  . Number of children: 2  . Years of education: 49  . Highest education level: Some college, no degree  Occupational History  . Not on file  Social Needs  . Financial resource strain: Not hard at all  . Food insecurity:    Worry: Never true    Inability: Never true  . Transportation needs:    Medical: No    Non-medical: No  Tobacco Use  . Smoking status: Never Smoker  . Smokeless tobacco: Never Used  Substance and Sexual Activity  . Alcohol use:  No  . Drug use: No  . Sexual activity: Yes    Partners: Male    Birth control/protection: None  Lifestyle  . Physical activity:    Days per week: 0 days    Minutes per session: 0  min  . Stress: Not at all  Relationships  . Social connections:    Talks on phone: More than three times a week    Gets together: More than three times a week    Attends religious service: More than 4 times per year    Active member of club or organization: No    Attends meetings of clubs or organizations: Never    Relationship status: Never married  . Intimate partner violence:    Fear of current or ex partner: No    Emotionally abused: No    Physically abused: No    Forced sexual activity: No  Other Topics Concern  . Not on file  Social History Narrative  . Not on file    FAMILY HISTORY: Family History  Problem Relation Age of Onset  . Diabetes Paternal Grandmother   . Cancer Maternal Grandfather        Pancreatic cancer, passed away froms stroke.  . Stroke Maternal Grandfather   . Heart attack Paternal Grandfather   . Breast cancer Maternal Aunt   . Lung cancer Maternal Aunt   . Non-Hodgkin's lymphoma Maternal Aunt   . Breast cancer Maternal Aunt   . Breast cancer Paternal Aunt   . Thyroid cancer Cousin   . Bladder Cancer Neg Hx   . Kidney cancer Neg Hx     ALLERGIES:  has No Known Allergies.  MEDICATIONS:  Current Outpatient Medications  Medication Sig Dispense Refill  . albuterol (PROVENTIL HFA;VENTOLIN HFA) 108 (90 Base) MCG/ACT inhaler Inhale 2-4 puffs by mouth every 4 hours as needed for wheezing, cough, and/or shortness of breath 1 Inhaler 1  . cetirizine (ZYRTEC) 10 MG tablet Take 10 mg by mouth daily.    . Cyanocobalamin (B-12 PO) Take 1 tablet by mouth daily.    Marland Kitchen EPIPEN 2-PAK 0.3 MG/0.3ML SOAJ injection Inject 1 mg into the muscle as needed. Reported on 07/05/2015    . ferrous sulfate 325 (65 FE) MG tablet Take 325 mg by mouth daily with breakfast.    . ferrous sulfate 325 (65 FE) MG tablet Take 1 tablet (325 mg total) by mouth 2 (two) times daily with a meal. 60 tablet 2   No current facility-administered medications for this visit.      PHYSICAL  EXAMINATION: ECOG PERFORMANCE STATUS: 0 - Asymptomatic Vitals:   05/04/18 1103  BP: 118/78  Pulse: 76  Resp: 18  Temp: (!) 96.4 F (35.8 C)   Filed Weights   05/04/18 1103  Weight: 231 lb 8 oz (105 kg)    Physical Exam Constitutional:      General: She is not in acute distress.    Comments: obese  HENT:     Head: Normocephalic and atraumatic.  Eyes:     General: No scleral icterus.    Pupils: Pupils are equal, round, and reactive to light.  Neck:     Musculoskeletal: Normal range of motion and neck supple.  Cardiovascular:     Rate and Rhythm: Normal rate and regular rhythm.     Heart sounds: Normal heart sounds.  Pulmonary:     Effort: Pulmonary effort is normal. No respiratory distress.     Breath sounds: No wheezing.  Abdominal:  General: Bowel sounds are normal. There is no distension.     Palpations: Abdomen is soft. There is no mass.     Tenderness: There is no abdominal tenderness.  Musculoskeletal: Normal range of motion.        General: No deformity.  Skin:    General: Skin is warm and dry.     Findings: No erythema or rash.  Neurological:     Mental Status: She is alert and oriented to person, place, and time.     Cranial Nerves: No cranial nerve deficit.     Coordination: Coordination normal.  Psychiatric:        Behavior: Behavior normal.        Thought Content: Thought content normal.      LABORATORY DATA:  I have reviewed the data as listed Lab Results  Component Value Date   WBC 9.8 05/04/2018   HGB 12.1 05/04/2018   HCT 37.7 05/04/2018   MCV 84.5 05/04/2018   PLT 489 (H) 05/04/2018   Recent Labs    02/28/18 1929 03/02/18 0325 04/01/18 1356  NA 138 142 140  K 4.1 4.2 4.6  CL 105 110 104  CO2 25 25 27   GLUCOSE 152* 137* 80  BUN 6 7 8   CREATININE 0.62 0.43* 0.69  CALCIUM 9.3 8.8* 9.7  GFRNONAA >60 >60 120  GFRAA >60 >60 139  PROT  --   --  7.1  AST  --   --  21  ALT  --   --  20  BILITOT  --   --  0.2    Iron/TIBC/Ferritin/ %Sat    Component Value Date/Time   IRON 55 04/29/2017 1459   TIBC 471 (H) 04/29/2017 1459   FERRITIN 20 04/29/2017 1459   IRONPCTSAT 12 04/29/2017 1459        ASSESSMENT & PLAN:  1. Thrombocytosis (HCC)   2. Other iron deficiency anemia   3. Digestive disorder   4. Vitamin B12 deficiency   5. Folate deficiency    I discussed with patient that the differential diagnosis of the thrombosis is broad, including benign etiology such as reactive to surgery, trauma, infection, nutrition deficiency, etc, as well as malignant etiology including underlying bone marrow disorders.  Repeat cbc, check smear, retic panel, hepatitis panel.   #Iron deficiency can lead to thrombocytosis.  She has been on iron tablets ferrous sulfate 325 mg daily for the past 3 years. Recent iron panel showed elevated TIBC 471, saturation ratio 12, ferritin 20.  Indicating underlying iron deficiency. Given her clinical history of chronic multiple bowel movements a day, history of iron supplementation without improvement of iron panel.  Suspect that she may has underlying digestive disorder causing malabsorption of dietary iron. I recommend IV Feraheme to further improve her iron level. We will trend CBC to see how platelet counts response to iron treatment.   Plan IV iron with Feraheme 510mg  weekly x 1. Allergy reactions/infusion reaction including anaphylactic reaction discussed with patient. Other side effects include but not limited to high blood pressure, skin rash, weight gain, leg swelling, etc. Patient voices understanding and willing to proceed.  # Folate deficiency, recommend folic acid 532 mcg daily.  #History of vitamin B12 deficiency, she has been taking oral vitamin B12 supplements.  Will repeat B12 at next visit.   Orders Placed This Encounter  Procedures  . CBC with Differential/Platelet    Standing Status:   Future    Number of Occurrences:   1  Standing Expiration Date:    05/05/2019  . Hepatitis panel, acute    Standing Status:   Future    Number of Occurrences:   1    Standing Expiration Date:   05/05/2019  . Retic Panel    Standing Status:   Future    Number of Occurrences:   1    Standing Expiration Date:   05/05/2019  . CBC with Differential/Platelet    Standing Status:   Future    Standing Expiration Date:   05/05/2019    All questions were answered. The patient knows to call the clinic with any problems questions or concerns.  Return of visit: 6 weeks.  Thank you for this kind referral and the opportunity to participate in the care of this patient. A copy of today's note is routed to referring provider  Total face to face encounter time for this patient visit was 45 min. >50% of the time was  spent in counseling and coordination of care.    Earlie Server, MD, PhD Hematology Oncology [facility] Pager- [phone] 05/04/2018

## 2018-05-05 ENCOUNTER — Encounter: Payer: Self-pay | Admitting: *Deleted

## 2018-05-05 ENCOUNTER — Telehealth: Payer: Self-pay | Admitting: *Deleted

## 2018-05-05 LAB — HEPATITIS PANEL, ACUTE
HCV Ab: <0.1 s/co ratio (ref 0.0–0.9)
Hep A IgM: NEGATIVE
Hep B C IgM: NEGATIVE
Hepatitis B Surface Ag: NEGATIVE

## 2018-05-05 NOTE — Telephone Encounter (Signed)
IV *NEW* Feraheme 510mg  x 1 dose. per Dr.Yu 05/04/18 staff messge: Called patient and made her aware of her scheduled 05/11/18 IV *NEW* Feraheme  appt.

## 2018-05-11 ENCOUNTER — Encounter: Payer: Self-pay | Admitting: Nurse Practitioner

## 2018-05-11 ENCOUNTER — Inpatient Hospital Stay: Payer: BLUE CROSS/BLUE SHIELD

## 2018-05-11 VITALS — BP 105/73 | HR 80 | Temp 97.9°F | Resp 18

## 2018-05-11 DIAGNOSIS — D508 Other iron deficiency anemias: Secondary | ICD-10-CM

## 2018-05-11 DIAGNOSIS — E538 Deficiency of other specified B group vitamins: Secondary | ICD-10-CM | POA: Diagnosis not present

## 2018-05-11 DIAGNOSIS — D473 Essential (hemorrhagic) thrombocythemia: Secondary | ICD-10-CM | POA: Diagnosis not present

## 2018-05-11 DIAGNOSIS — D509 Iron deficiency anemia, unspecified: Secondary | ICD-10-CM | POA: Diagnosis not present

## 2018-05-11 MED ORDER — DEXAMETHASONE SODIUM PHOSPHATE 10 MG/ML IJ SOLN
INTRAMUSCULAR | Status: AC
  Filled 2018-05-11: qty 1

## 2018-05-11 MED ORDER — DEXAMETHASONE SODIUM PHOSPHATE 10 MG/ML IJ SOLN
10.0000 mg | Freq: Once | INTRAMUSCULAR | Status: AC
  Administered 2018-05-11: 10 mg via INTRAVENOUS

## 2018-05-11 MED ORDER — SODIUM CHLORIDE 0.9 % IV SOLN
Freq: Once | INTRAVENOUS | Status: AC
  Administered 2018-05-11: 12:00:00 via INTRAVENOUS
  Filled 2018-05-11: qty 250

## 2018-05-11 MED ORDER — DIPHENHYDRAMINE HCL 50 MG/ML IJ SOLN
25.0000 mg | Freq: Once | INTRAMUSCULAR | Status: AC
  Administered 2018-05-11: 25 mg via INTRAVENOUS

## 2018-05-11 MED ORDER — SODIUM CHLORIDE 0.9 % IV SOLN
510.0000 mg | Freq: Once | INTRAVENOUS | Status: AC
  Administered 2018-05-11: 510 mg via INTRAVENOUS
  Filled 2018-05-11: qty 17

## 2018-05-11 NOTE — Progress Notes (Signed)
Infusion Reaction Progress Note  Date: 05/11/18  Reaction Notes: Chest Pressure/Heaviness with flushing within minutes of starting infusion of Feraheme. Feraheme stopped. Vitals stable. Nursing administered benadryl 25 mg per protocol. Symptoms resolved. Discussed with Dr. Tasia Catchings who advised Dex 10mg  IV and rechallenge at slower rate, which patient was agreeable to. Rechallenged and patient tolerated without complications. Per Dr. Tasia Catchings, she may consider pre-med prior to next infusion and slower infusion rate.     Beckey Rutter, DNP, AGNP-C [place] [facility] [phone] (work cell) [phone] (office)

## 2018-05-11 NOTE — Progress Notes (Signed)
11:42 - patient began Feraheme at 11:38, now complaining of chest tightness, nausea, face bright red.  Stopped Feraheme, increased NS fluid, checked vitals (see flowsheet) called Lauren, NP.  Gave patient Benadryl 25 mg, Lauren assessed patient and ordered Decadron 10 mg IV.  Patient quickly returned to baseline, sleepy from Benadryl.  Per Ander Purpura, continue to monitor patient/vitals and restart Feraheme at half rate, monitor for continued reaction.   13:25 - restarted Feraheme at 12:25, patient tolerated well, per Ander Purpura, NP, OK to discharge patient.

## 2018-05-12 ENCOUNTER — Other Ambulatory Visit: Payer: Self-pay | Admitting: Oncology

## 2018-05-13 ENCOUNTER — Ambulatory Visit: Payer: BLUE CROSS/BLUE SHIELD | Admitting: Family Medicine

## 2018-05-13 ENCOUNTER — Encounter: Payer: Self-pay | Admitting: Family Medicine

## 2018-05-13 VITALS — BP 122/80 | HR 93 | Temp 98.1°F | Resp 14 | Ht 68.0 in | Wt 232.2 lb

## 2018-05-13 DIAGNOSIS — Z0000 Encounter for general adult medical examination without abnormal findings: Secondary | ICD-10-CM | POA: Diagnosis not present

## 2018-05-13 NOTE — Progress Notes (Signed)
Name: Jacqueline Fields   MRN: [id number]    DOB: 1991-04-02   Date:05/13/2018       Progress Note  Subjective  Chief Complaint  Chief Complaint  Patient presents with  . Annual Exam    HPI  Patient presents for annual CPE.  Diet: Eating about 1-2 meals a day with snacks. Sometimes has vegetables, rarely has fruits.  Exercise: She is not exercising right now.   USPSTF grade A and B recommendations    Office Visit from 05/13/2018 in [facility]  AUDIT-C Score  1    Rarely  Depression:  Depression screen [facility] 2/9 05/13/2018 04/01/2018 11/17/2017  Decreased Interest 0 0 0  Down, Depressed, Hopeless 0 0 0  PHQ - 2 Score 0 0 0  Altered sleeping 0 0 0  Tired, decreased energy 0 0 1  Change in appetite 0 0 0  Feeling bad or failure about yourself  0 0 0  Trouble concentrating 0 0 0  Moving slowly or fidgety/restless 0 0 1  Suicidal thoughts 0 0 0  PHQ-9 Score 0 0 2  Difficult doing work/chores Not difficult at all Not difficult at all Not difficult at all   Hypertension: BP Readings from Last 3 Encounters:  05/13/18 122/80  05/11/18 105/73  05/04/18 118/78   Obesity: Wt Readings from Last 3 Encounters:  05/13/18 232 lb 3.2 oz (105.3 kg)  05/04/18 231 lb 8 oz (105 kg)  04/01/18 229 lb 6.4 oz (104.1 kg)   BMI Readings from Last 3 Encounters:  05/13/18 35.31 kg/m  05/04/18 35.20 kg/m  04/01/18 35.93 kg/m    Hep C Screening: Negative in 2019 STD testing and prevention (HIV/chl/gon/syphilis): Negative in December 2019 Intimate partner violence: No concerns; Single Sexual History/Pain during Intercourse: No concerns Menstrual History/LMP/Abnormal Bleeding: The last 3 months have been regular. She does have a history of amenorrhea when on birth control only. Incontinence Symptoms: No concerns.  Advanced Care Planning: A voluntary discussion about advance care planning including the explanation and discussion of advance directives.  Discussed health  care proxy and Living will, and the patient was able to identify a health care proxy as Mother - Brissa Asante.  Patient does not have a living will at present time. If patient does have living will, I have requested they bring this to the clinic to be scanned in to their chart.  Breast cancer: +Family history, all post-menopausal.  She states her sister had genetic testing done and was negative, however she is unsure if the family members who had cancer had genetic testing. No results found for: [facility]  BRCA gene screening: See above Cervical cancer screening: Had in 2018, norma, not due until 2021.  Osteoporosis Screening: N/A No results found for: HMDEXASCAN  Lipids:  Lab Results  Component Value Date   CHOL 203 (H) 04/01/2018   Lab Results  Component Value Date   HDL 46 (L) 04/01/2018   Lab Results  Component Value Date   LDLCALC 133 (H) 04/01/2018   Lab Results  Component Value Date   TRIG 129 04/01/2018   Lab Results  Component Value Date   CHOLHDL 4.4 04/01/2018   No results found for: LDLDIRECT  Glucose:  Glucose  Date Value Ref Range Status  01/04/2013 117 (H) 65 - 99 mg/dL Final   Glucose, Bld  Date Value Ref Range Status  04/01/2018 80 65 - 99 mg/dL Final    Comment:    .  Fasting reference interval .   03/02/2018 137 (H) 70 - 99 mg/dL Final  02/28/2018 152 (H) 70 - 99 mg/dL Final    Skin cancer: No concerning lesions Colorectal cancer: Denies family or personal history of colorectal cancer, no changes in BM's - no blood in stool, dark and tarry stool, mucus in stool, or constipation/diarrhea. Lung cancer: N/A Low Dose CT Chest recommended if Age 79-80 years, 30 pack-year currently smoking OR have quit w/in 15years. Patient does not qualify.   ECG: No chest pain, shortness of breath, or palpitations.  Patient Active Problem List   Diagnosis Date Noted  . Iron deficiency anemia 05/04/2018  . CAP (community acquired pneumonia) 03/01/2018  .  Oligomenorrhea, unspecified 11/17/2017  . Cholecystitis 11/14/2015  . RUQ abdominal pain   . Normal labor 07/10/2015  . Irregular menses 11/30/2012  . Menstrual cramps 11/30/2012  . Routine general medical examination at a health care facility 10/11/2012  . Encounter for routine gynecological examination 10/11/2012  . Tremors of nervous system 08/27/2011  . Heart palpitations 08/27/2011  . Asthma 08/27/2011  . Anemia   . Allergy   . Occasional tremors 07/17/2011  . Stuttering 07/17/2011    Past Surgical History:  Procedure Laterality Date  . ADENOIDECTOMY    . CHOLECYSTECTOMY N/A 11/15/2015   Procedure: LAPAROSCOPIC CHOLECYSTECTOMY WITH INTRAOPERATIVE CHOLANGIOGRAM;  Surgeon: Clayburn Pert, MD;  Location: [facility] ORS;  Service: General;  Laterality: N/A;  . COLONOSCOPY WITH PROPOFOL N/A 05/11/2017   Procedure: COLONOSCOPY WITH PROPOFOL;  Surgeon: Jonathon Bellows, MD;  Location: [facility] ENDOSCOPY;  Service: Gastroenterology;  Laterality: N/A;  . ESOPHAGOGASTRODUODENOSCOPY (EGD) WITH PROPOFOL N/A 05/11/2017   Procedure: ESOPHAGOGASTRODUODENOSCOPY (EGD) WITH PROPOFOL;  Surgeon: Jonathon Bellows, MD;  Location[facility] ENDOSCOPY;  Service: Gastroenterology;  Laterality: N/A;    Family History  Problem Relation Age of Onset  . Diabetes Paternal Grandmother   . Cancer Maternal Grandfather        Pancreatic cancer, passed away froms stroke.  . Stroke Maternal Grandfather   . Heart attack Paternal Grandfather   . Breast cancer Maternal Aunt   . Lung cancer Maternal Aunt   . Non-Hodgkin's lymphoma Maternal Aunt   . Breast cancer Maternal Aunt   . Breast cancer Paternal Aunt   . Thyroid cancer Cousin   . Bladder Cancer Neg Hx   . Kidney cancer Neg Hx     Social History   Socioeconomic History  . Marital status: Single    Spouse name: Not on file  . Number of children: 2  . Years of education: 55  . Highest education level: Some college, no degree  Occupational History  . Not on file  Social  Needs  . Financial resource strain: Not hard at all  . Food insecurity:    Worry: Never true    Inability: Never true  . Transportation needs:    Medical: No    Non-medical: No  Tobacco Use  . Smoking status: Never Smoker  . Smokeless tobacco: Never Used  Substance and Sexual Activity  . Alcohol use: No  . Drug use: No  . Sexual activity: Yes    Partners: Male    Birth control/protection: None  Lifestyle  . Physical activity:    Days per week: 0 days    Minutes per session: 0 min  . Stress: Not at all  Relationships  . Social connections:    Talks on phone: More than three times a week    Gets together: More than three  times a week    Attends religious service: More than 4 times per year    Active member of club or organization: No    Attends meetings of clubs or organizations: Never    Relationship status: Never married  . Intimate partner violence:    Fear of current or ex partner: No    Emotionally abused: No    Physically abused: No    Forced sexual activity: No  Other Topics Concern  . Not on file  Social History Narrative  . Not on file     Current Outpatient Medications:  .  albuterol (PROVENTIL HFA;VENTOLIN HFA) 108 (90 Base) MCG/ACT inhaler, Inhale 2-4 puffs by mouth every 4 hours as needed for wheezing, cough, and/or shortness of breath, Disp: 1 Inhaler, Rfl: 1 .  cetirizine (ZYRTEC) 10 MG tablet, Take 10 mg by mouth daily., Disp: , Rfl:  .  Cyanocobalamin (B-12 PO), Take 1 tablet by mouth daily., Disp: , Rfl:  .  EPIPEN 2-PAK 0.3 MG/0.3ML SOAJ injection, Inject 1 mg into the muscle as needed. Reported on 07/05/2015, Disp: , Rfl:  .  ferrous sulfate 325 (65 FE) MG tablet, Take 1 tablet (325 mg total) by mouth 2 (two) times daily with a meal., Disp: 60 tablet, Rfl: 2 .  ferrous sulfate 325 (65 FE) MG tablet, Take 325 mg by mouth daily with breakfast., Disp: , Rfl:   No Known Allergies   ROS  Constitutional: Negative for fever or weight change.   Respiratory: Negative for cough and shortness of breath.   Cardiovascular: Negative for chest pain or palpitations.  Gastrointestinal: Negative for abdominal pain, no bowel changes.  Musculoskeletal: Negative for gait problem or joint swelling.  Skin: Negative for rash.  Neurological: Negative for dizziness or headache.  No other specific complaints in a complete review of systems (except as listed in HPI above)  Objective  Vitals:   05/13/18 1246  BP: 122/80  Pulse: 93  Resp: 14  Temp: 98.1 F (36.7 C)  TempSrc: Oral  SpO2: 99%  Weight: 232 lb 3.2 oz (105.3 kg)  Height: 5' 8"  (1.727 m)    Body mass index is 35.31 kg/m.  Physical Exam Constitutional: Patient appears well-developed and well-nourished. No distress.  HENT: Head: Normocephalic and atraumatic. Ears: B TMs ok, no erythema or effusion; Nose: Nose normal. Mouth/Throat: Oropharynx is clear and moist. No oropharyngeal exudate.  Eyes: Conjunctivae and EOM are normal. Pupils are equal, round, and reactive to light. No scleral icterus.  Neck: Normal range of motion. Neck supple. No JVD present. No thyromegaly present.  Cardiovascular: Normal rate, regular rhythm and normal heart sounds.  No murmur heard. No BLE edema. Pulmonary/Chest: Effort normal and breath sounds normal. No respiratory distress. Abdominal: Soft. Bowel sounds are normal, no distension. There is no tenderness. no masses Breast: no lumps or masses, no nipple discharge or rashes FEMALE GENITALIA: Deferred Musculoskeletal: Normal range of motion, no joint effusions. No gross deformities Neurological: he is alert and oriented to person, place, and time. No cranial nerve deficit. Coordination, balance, strength, speech and gait are normal.  Skin: Skin is warm and dry. No rash noted. No erythema.  Psychiatric: Patient has a normal mood and affect. behavior is normal. Judgment and thought content normal.    Recent Results (from the past 2160 hour(s))   Basic metabolic panel     Status: Abnormal   Collection Time: 02/28/18  7:29 PM  Result Value Ref Range   Sodium 138 135 - 145  mmol/L   Potassium 4.1 3.5 - 5.1 mmol/L   Chloride 105 98 - 111 mmol/L   CO2 25 22 - 32 mmol/L   Glucose, Bld 152 (H) 70 - 99 mg/dL   BUN 6 6 - 20 mg/dL   Creatinine, Ser 0.62 0.44 - 1.00 mg/dL   Calcium 9.3 8.9 - 10.3 mg/dL   GFR calc non Af Amer >60 >60 mL/min   GFR calc Af Amer >60 >60 mL/min    Comment: (NOTE) The eGFR has been calculated using the CKD EPI equation. This calculation has not been validated in all clinical situations. eGFR's persistently <60 mL/min signify possible Chronic Kidney Disease.    Anion gap 8 5 - 15    Comment: Performed at [facility], [address] [place], [place] [address]  CBC     Status: Abnormal   Collection Time: 02/28/18  7:29 PM  Result Value Ref Range   WBC 21.5 (H) 4.0 - 10.5 K/uL   RBC 4.27 3.87 - 5.11 MIL/uL   Hemoglobin 11.9 (L) 12.0 - 15.0 g/dL   HCT 36.7 36.0 - 46.0 %   MCV 85.9 80.0 - 100.0 fL   MCH 27.9 26.0 - 34.0 pg   MCHC 32.4 30.0 - 36.0 g/dL   RDW 12.7 11.5 - 15.5 %   Platelets 445 (H) 150 - 400 K/uL   nRBC 0.0 0.0 - 0.2 %    Comment: Performed at [facility], [address] [place], [place] [address]  Troponin I - ONCE - STAT     Status: None   Collection Time: 02/28/18  7:29 PM  Result Value Ref Range   Troponin I <0.03 <0.03 ng/mL    Comment: Performed at [facility], [address] [place], [place] [address]  TSH     Status: None   Collection Time: 02/28/18  7:29 PM  Result Value Ref Range   TSH 0.816 0.350 - 4.500 uIU/mL    Comment: Performed by a 3rd Generation assay with a functional sensitivity of <=0.01 uIU/mL. Performed at [facility], [address] [place], [place] [address]   Hemoglobin A1c     Status: None   Collection Time: 02/28/18  7:29 PM  Result Value Ref Range   Hgb A1c MFr Bld 4.9 4.8 - 5.6 %    Comment: (NOTE) Pre  diabetes:          5.7%-6.4% Diabetes:              >6.4% Glycemic control for   <7.0% adults with diabetes    Mean Plasma Glucose 93.93 mg/dL    Comment: Performed at [facility] [address] [place],  [address]  Pregnancy, urine POC     Status: None   Collection Time: 02/28/18  7:34 PM  Result Value Ref Range   Preg Test, Ur NEGATIVE NEGATIVE    Comment:        THE SENSITIVITY OF THIS METHODOLOGY IS >24 mIU/mL   Fibrin derivatives D-Dimer ([facility] only)     Status: Abnormal   Collection Time: 03/01/18 12:36 AM  Result Value Ref Range   Fibrin derivatives D-dimer ([facility]) 513.32 (H) 0.00 - 499.00 ng/mL (FEU)    Comment: (NOTE) <> Exclusion of Venous Thromboembolism (VTE) - OUTPATIENT ONLY   (Emergency Department or [place])   0-499 ng/ml (FEU): With a low to intermediate pretest probability  for VTE this test result excludes the diagnosis                      of VTE.   >499 ng/ml (FEU) : VTE not excluded; additional work up for VTE is                      required. <> Testing on Inpatients and Evaluation of Disseminated Intravascular   Coagulation (DIC) Reference Range:   0-499 ng/ml (FEU) Performed at [facility], [address] [place], [place] [address]   Influenza panel by PCR (type A & B)     Status: None   Collection Time: 03/01/18 12:36 AM  Result Value Ref Range   Influenza A By PCR NEGATIVE NEGATIVE   Influenza B By PCR NEGATIVE NEGATIVE    Comment: (NOTE) The Xpert Xpress Flu assay is intended as an aid in the diagnosis of  influenza and should not be used as a sole basis for treatment.  This  assay is FDA approved for nasopharyngeal swab specimens only. Nasal  washings and aspirates are unacceptable for Xpert Xpress Flu testing. Performed at [facility], [place], [place] [address]   CG4 I-STAT (Lactic acid)     Status: Abnormal   Collection Time: 03/01/18  3:14 AM  Result Value Ref Range    Lactic Acid, Venous 2.81 (HH) 0.5 - 1.9 mmol/L   Comment MD NOTIFIED, REPEAT TEST   Brain natriuretic peptide - IF patient is dyspneic     Status: None   Collection Time: 03/01/18  3:42 AM  Result Value Ref Range   B Natriuretic Peptide 21.0 0.0 - 100.0 pg/mL    Comment: Performed at [facility], [address] [place], [place] [address]  Procalcitonin     Status: None   Collection Time: 03/01/18  3:42 AM  Result Value Ref Range   Procalcitonin <0.10 ng/mL    Comment:        Interpretation: PCT (Procalcitonin) <= 0.5 ng/mL: Systemic infection (sepsis) is not likely. Local bacterial infection is possible. (NOTE)       Sepsis PCT Algorithm           Lower Respiratory Tract                                      Infection PCT Algorithm    ----------------------------     ----------------------------         PCT < 0.25 ng/mL                PCT < 0.10 ng/mL         Strongly encourage             Strongly discourage   discontinuation of antibiotics    initiation of antibiotics    ----------------------------     -----------------------------       PCT 0.25 - 0.50 ng/mL            PCT 0.10 - 0.25 ng/mL               OR       >80% decrease in PCT            Discourage initiation of  antibiotics      Encourage discontinuation           of antibiotics    ----------------------------     -----------------------------         PCT >= 0.50 ng/mL              PCT 0.26 - 0.50 ng/mL               AND        <80% decrease in PCT             Encourage initiation of                                             antibiotics       Encourage continuation           of antibiotics    ----------------------------     -----------------------------        PCT >= 0.50 ng/mL                  PCT > 0.50 ng/mL               AND         increase in PCT                  Strongly encourage                                      initiation of antibiotics     Strongly encourage escalation           of antibiotics                                     -----------------------------                                           PCT <= 0.25 ng/mL                                                 OR                                        > 80% decrease in PCT                                     Discontinue / Do not initiate                                             antibiotics Performed at [facility], [address] [place], [place] [address]   Blood Culture (routine x 2)     Status: None   Collection  Time: 03/01/18  3:42 AM  Result Value Ref Range   Specimen Description BLOOD LEFT ANTECUBITAL    Special Requests      BOTTLES DRAWN AEROBIC AND ANAEROBIC Blood Culture results may not be optimal due to an excessive volume of blood received in culture bottles   Culture      NO GROWTH 5 DAYS Performed at [facility], [address] [place], [place] [address]    Report Status 03/06/2018 FINAL   Blood Culture (routine x 2)     Status: None   Collection Time: 03/01/18  3:43 AM  Result Value Ref Range   Specimen Description BLOOD BLOOD LEFT HAND    Special Requests      BOTTLES DRAWN AEROBIC AND ANAEROBIC Blood Culture results may not be optimal due to an excessive volume of blood received in culture bottles   Culture      NO GROWTH 5 DAYS Performed at [facility], [address] [place], [place] [address]    Report Status 03/06/2018 FINAL   Urinalysis, Complete w Microscopic     Status: Abnormal   Collection Time: 03/01/18  4:09 AM  Result Value Ref Range   Color, Urine STRAW (A) YELLOW   APPearance CLEAR (A) CLEAR   Specific Gravity, Urine 1.036 (H) 1.005 - 1.030   pH 6.0 5.0 - 8.0   Glucose, UA >=500 (A) NEGATIVE mg/dL   Hgb urine dipstick SMALL (A) NEGATIVE   Bilirubin Urine NEGATIVE NEGATIVE   Ketones, ur NEGATIVE NEGATIVE mg/dL   Protein, ur NEGATIVE NEGATIVE mg/dL   Nitrite NEGATIVE NEGATIVE    Leukocytes, UA NEGATIVE NEGATIVE   RBC / HPF 0-5 0 - 5 RBC/hpf   WBC, UA 0-5 0 - 5 WBC/hpf   Bacteria, UA NONE SEEN NONE SEEN   Squamous Epithelial / LPF 0-5 0 - 5    Comment: Performed at [facility], [address] [place], [place] [address]  Urine culture     Status: Abnormal   Collection Time: 03/01/18  4:09 AM  Result Value Ref Range   Specimen Description      URINE, RANDOM Performed at [facility], [address] [place], [place] [address]    Special Requests      NONE Performed at [facility], [address] [place], [place] [address]    Culture MULTIPLE SPECIES PRESENT, SUGGEST RECOLLECTION (A)    Report Status 03/02/2018 FINAL   Lactic acid, plasma     Status: Abnormal   Collection Time: 03/01/18  5:28 AM  Result Value Ref Range   Lactic Acid, Venous 2.4 (HH) 0.5 - 1.9 mmol/L    Comment: CRITICAL RESULT CALLED TO, READ BACK BY AND VERIFIED WITH DEBRA BLADDOCK ON 03/01/18 AT 0604 QSD Performed at [facility], [address] [place], [place] [address]   CBC     Status: Abnormal   Collection Time: 03/02/18  3:25 AM  Result Value Ref Range   WBC 15.7 (H) 4.0 - 10.5 K/uL   RBC 3.68 (L) 3.87 - 5.11 MIL/uL   Hemoglobin 10.2 (L) 12.0 - 15.0 g/dL   HCT 32.5 (L) 36.0 - 46.0 %   MCV 88.3 80.0 - 100.0 fL   MCH 27.7 26.0 - 34.0 pg   MCHC 31.4 30.0 - 36.0 g/dL   RDW 13.0 11.5 - 15.5 %   Platelets 445 (H) 150 - 400 K/uL   nRBC 0.0 0.0 - 0.2 %    Comment: Performed  at [facility], [address] [place], [place] [address]  Basic metabolic panel     Status: Abnormal   Collection Time: 03/02/18  3:25 AM  Result Value Ref Range   Sodium 142 135 - 145 mmol/L   Potassium 4.2 3.5 - 5.1 mmol/L   Chloride 110 98 - 111 mmol/L   CO2 25 22 - 32 mmol/L   Glucose, Bld 137 (H) 70 - 99 mg/dL   BUN 7 6 - 20 mg/dL   Creatinine, Ser 0.43 (L) 0.44 - 1.00 mg/dL   Calcium 8.8 (L) 8.9 - 10.3 mg/dL   GFR calc non Af Amer >60 >60  mL/min   GFR calc Af Amer >60 >60 mL/min    Comment: (NOTE) The eGFR has been calculated using the CKD EPI equation. This calculation has not been validated in all clinical situations. eGFR's persistently <60 mL/min signify possible Chronic Kidney Disease.    Anion gap 7 5 - 15    Comment: Performed at [facility], [address] [place], [place] [address]  CBC     Status: Abnormal   Collection Time: 03/03/18  3:02 AM  Result Value Ref Range   WBC 14.7 (H) 4.0 - 10.5 K/uL   RBC 4.22 3.87 - 5.11 MIL/uL   Hemoglobin 11.5 (L) 12.0 - 15.0 g/dL   HCT 36.9 36.0 - 46.0 %   MCV 87.4 80.0 - 100.0 fL   MCH 27.3 26.0 - 34.0 pg   MCHC 31.2 30.0 - 36.0 g/dL   RDW 12.8 11.5 - 15.5 %   Platelets 560 (H) 150 - 400 K/uL   nRBC 0.0 0.0 - 0.2 %    Comment: Performed at [facility], [address] [place], [place] [address]  Cervicovaginal ancillary only     Status: None   Collection Time: 04/01/18 12:00 AM  Result Value Ref Range   Chlamydia Negative     Comment: Normal Reference Range - Negative   Neisseria gonorrhea Negative     Comment: Normal Reference Range - Negative  COMPLETE METABOLIC PANEL WITH GFR     Status: None   Collection Time: 04/01/18  1:56 PM  Result Value Ref Range   Glucose, Bld 80 65 - 99 mg/dL    Comment: .            Fasting reference interval .    BUN 8 7 - 25 mg/dL   Creat 0.69 0.50 - 1.10 mg/dL   GFR, Est Non African American 120 > OR = 60 mL/min/1.47m   GFR, Est African American 139 > OR = 60 mL/min/1.776m  BUN/Creatinine Ratio NOT APPLICABLE 6 - 22 (calc)   Sodium 140 135 - 146 mmol/L   Potassium 4.6 3.5 - 5.3 mmol/L   Chloride 104 98 - 110 mmol/L   CO2 27 20 - 32 mmol/L   Calcium 9.7 8.6 - 10.2 mg/dL   Total Protein 7.1 6.1 - 8.1 g/dL   Albumin 4.2 3.6 - 5.1 g/dL   Globulin 2.9 1.9 - 3.7 g/dL (calc)   AG Ratio 1.4 1.0 - 2.5 (calc)   Total Bilirubin 0.2 0.2 - 1.2 mg/dL   Alkaline phosphatase (APISO) 74 33 - 115 U/L   AST 21 10  - 30 U/L   ALT 20 6 - 29 U/L  CBC w/Diff/Platelet     Status: Abnormal   Collection Time: 04/01/18  1:56 PM  Result Value Ref Range   WBC 9.5 3.8 - 10.8 Thousand/uL  RBC 4.36 3.80 - 5.10 Million/uL   Hemoglobin 11.7 11.7 - 15.5 g/dL   HCT 36.8 35.0 - 45.0 %   MCV 84.4 80.0 - 100.0 fL   MCH 26.8 (L) 27.0 - 33.0 pg   MCHC 31.8 (L) 32.0 - 36.0 g/dL   RDW 12.6 11.0 - 15.0 %   Platelets 586 (H) 140 - 400 Thousand/uL   MPV 8.8 7.5 - 12.5 fL   Neutro Abs 5,491 1,500 - 7,800 cells/uL   Lymphs Abs 3,059 850 - 3,900 cells/uL   Absolute Monocytes 428 200 - 950 cells/uL   Eosinophils Absolute 447 15 - 500 cells/uL   Basophils Absolute 76 0 - 200 cells/uL   Neutrophils Relative % 57.8 %   Total Lymphocyte 32.2 %   Monocytes Relative 4.5 %   Eosinophils Relative 4.7 %   Basophils Relative 0.8 %  HIV Antibody (routine testing w rflx)     Status: None   Collection Time: 04/01/18  1:56 PM  Result Value Ref Range   HIV 1&2 Ab, 4th Generation NON-REACTIVE NON-REACTI    Comment: HIV-1 antigen and HIV-1/HIV-2 antibodies were not detected. There is no laboratory evidence of HIV infection. Marland Kitchen PLEASE NOTE: This information has been disclosed to you from records whose confidentiality may be protected by state law.  If your state requires such protection, then the state law prohibits you from making any further disclosure of the information without the specific written consent of the person to whom it pertains, or as otherwise permitted by law. A general authorization for the release of medical or other information is NOT sufficient for this purpose. . For additional information please refer to http://education.questdiagnostics.com/faq/FAQ106 (This link is being provided for informational/ educational purposes only.) . Marland Kitchen The performance of this assay has not been clinically validated in patients less than 49 years old. .   RPR     Status: None   Collection Time: 04/01/18  1:56 PM  Result  Value Ref Range   RPR Ser Ql NON-REACTIVE NON-REACTI  Hemoglobin A1C w/out eAG     Status: None   Collection Time: 04/01/18  1:56 PM  Result Value Ref Range   Hgb A1c MFr Bld 5.1 <5.7 % of total Hgb    Comment: For the purpose of screening for the presence of diabetes: . <5.7%       Consistent with the absence of diabetes 5.7-6.4%    Consistent with increased risk for diabetes             (prediabetes) > or =6.5%  Consistent with diabetes . This assay result is consistent with a decreased risk of diabetes. . Currently, no consensus exists regarding use of hemoglobin A1c for diagnosis of diabetes in children. . According to American Diabetes Association (ADA) guidelines, hemoglobin A1c <7.0% represents optimal control in non-pregnant diabetic patients. Different metrics may apply to specific patient populations.  Standards of Medical Care in Diabetes(ADA). .   TEST AUTHORIZATION     Status: None   Collection Time: 04/01/18  1:56 PM  Result Value Ref Range   TEST NAME: HEMOGLOBIN A1c    TEST CODE: [id number]    CLIENT CONTACT: TISH MCMURTRY    REPORT ALWAYS MESSAGE SIGNATURE      Comment: . The laboratory testing on this patient was verbally requested or confirmed by the ordering physician or his or her authorized representative after contact with an employee of [employer]. Federal regulations require that we maintain on file written authorization for  all laboratory testing.  Accordingly we are asking that the ordering physician or his or her authorized representative sign a copy of this report and promptly return it to the client service representative. . . Signature:____________________________________________________ . Please fax this signed page to [phone] or return it via your [employer] courier.   Lipid panel     Status: Abnormal   Collection Time: 04/01/18  1:56 PM  Result Value Ref Range   Cholesterol 203 (H) <200 mg/dL   HDL 46 (L) >50  mg/dL   Triglycerides 129 <150 mg/dL   LDL Cholesterol (Calc) 133 (H) mg/dL (calc)    Comment: Reference range: <100 . Desirable range <100 mg/dL for primary prevention;   <70 mg/dL for patients with CHD or diabetic patients  with > or = 2 CHD risk factors. Marland Kitchen LDL-C is now calculated using the Martin-Hopkins  calculation, which is a validated novel method providing  better accuracy than the Friedewald equation in the  estimation of LDL-C.  Cresenciano Genre et al. Annamaria Helling. 2956;213(08): 2061-2068  (http://education.[facility].com/faq/FAQ164)    Total CHOL/HDL Ratio 4.4 <5.0 (calc)   Non-HDL Cholesterol (Calc) 157 (H) <130 mg/dL (calc)    Comment: For patients with diabetes plus 1 major ASCVD risk  factor, treating to a non-HDL-C goal of <100 mg/dL  (LDL-C of <70 mg/dL) is considered a therapeutic  option.   TEST AUTHORIZATION 2     Status: None   Collection Time: 04/01/18  1:56 PM  Result Value Ref Range   TEST NAME: LIPID PANEL, STANDARD    TEST CODE: [id number]    CLIENT CONTACT: BOYCE,EMILY    REPORT ALWAYS MESSAGE SIGNATURE      Comment: . The laboratory testing on this patient was verbally requested or confirmed by the ordering physician or his or her authorized representative after contact with an employee of [employer]. Federal regulations require that we maintain on file written authorization for all laboratory testing.  Accordingly we are asking that the ordering physician or his or her authorized representative sign a copy of this report and promptly return it to the client service representative. . . Signature:____________________________________________________ . Please fax this signed page to [phone] or return it via your [employer] courier.   Retic Panel     Status: None   Collection Time: 05/04/18 11:38 AM  Result Value Ref Range   Retic Ct Pct 1.3 0.4 - 3.1 %   RBC. 4.46 3.87 - 5.11 MIL/uL   Retic Count, Absolute 57.5 19.0 - 186.0 K/uL    Immature Retic Fract 5.5 2.3 - 15.9 %   Reticulocyte Hemoglobin 30.4 >27.9 pg    Comment: Performed at [facility], [address] [place], [place] [address]  Hepatitis panel, acute     Status: None   Collection Time: 05/04/18 11:38 AM  Result Value Ref Range   Hepatitis B Surface Ag Negative Negative   HCV Ab <0.1 0.0 - 0.9 s/co ratio    Comment: (NOTE)                                  Negative:     < 0.8                             Indeterminate: 0.8 - 0.9  Positive:     > 0.9 The CDC recommends that a positive HCV antibody result be followed up with a HCV Nucleic Acid Amplification test (450388). Performed At: [facility] [place], [place] [id number] Rush Farmer MD PH:[id number]    Hep A IgM Negative Negative   Hep B C IgM Negative Negative  CBC with Differential/Platelet     Status: Abnormal   Collection Time: 05/04/18 11:38 AM  Result Value Ref Range   WBC 9.8 4.0 - 10.5 K/uL   RBC 4.46 3.87 - 5.11 MIL/uL   Hemoglobin 12.1 12.0 - 15.0 g/dL   HCT 37.7 36.0 - 46.0 %   MCV 84.5 80.0 - 100.0 fL   MCH 27.1 26.0 - 34.0 pg   MCHC 32.1 30.0 - 36.0 g/dL   RDW 13.2 11.5 - 15.5 %   Platelets 489 (H) 150 - 400 K/uL   nRBC 0.0 0.0 - 0.2 %   Neutrophils Relative % 60 %   Neutro Abs 5.8 1.7 - 7.7 K/uL   Lymphocytes Relative 29 %   Lymphs Abs 2.9 0.7 - 4.0 K/uL   Monocytes Relative 5 %   Monocytes Absolute 0.5 0.1 - 1.0 K/uL   Eosinophils Relative 5 %   Eosinophils Absolute 0.5 0.0 - 0.5 K/uL   Basophils Relative 1 %   Basophils Absolute 0.1 0.0 - 0.1 K/uL   Immature Granulocytes 0 %   Abs Immature Granulocytes 0.03 0.00 - 0.07 K/uL    Comment: Performed at [facility], [address] [place], [place] [address]  Technologist smear review     Status: None   Collection Time: 05/04/18 11:41 AM  Result Value Ref Range   Tech Review WBC MORPHOLOGY UNREMARKABLE     Comment: RBC MORPHOLOGY  UNREMARKABLE PLATELETS APPEAR ADEQUATE Normal platelet morphology Performed at [facility], [address] [place], [place] [address]    PHQ2/9: Depression screen [facility] 2/9 05/13/2018 04/01/2018 11/17/2017  Decreased Interest 0 0 0  Down, Depressed, Hopeless 0 0 0  PHQ - 2 Score 0 0 0  Altered sleeping 0 0 0  Tired, decreased energy 0 0 1  Change in appetite 0 0 0  Feeling bad or failure about yourself  0 0 0  Trouble concentrating 0 0 0  Moving slowly or fidgety/restless 0 0 1  Suicidal thoughts 0 0 0  PHQ-9 Score 0 0 2  Difficult doing work/chores Not difficult at all Not difficult at all Not difficult at all    Fall Risk: Fall Risk  05/13/2018 04/01/2018  Falls in the past year? 0 0  Number falls in past yr: 0 -  Injury with Fall? 0 -  Follow up Falls evaluation completed -    Assessment & Plan  1. Well woman exam (no gynecological exam) -USPSTF grade A and B recommendations reviewed with patient; age-appropriate recommendations, preventive care, screening tests, etc discussed and encouraged; healthy living encouraged; see AVS for patient education given to patient -Discussed importance of 150 minutes of physical activity weekly, eat two servings of fish weekly, eat one serving of tree nuts ( cashews, pistachios, pecans, almonds.Marland Kitchen) every other day, eat 6 servings of fruit/vegetables daily and drink plenty of water and avoid sweet beverages.

## 2018-05-13 NOTE — Patient Instructions (Addendum)
Your LDL is above normal.  The LDL is the "lousy" or bad cholesterol. Over time and in combination with inflammation and other factors, this contributes to plaque which in turn may lead to stroke and/or heart attack down the road.  Sometimes high LDL is primarily genetic, and people might be eating all the right foods but still have high numbers.  Other times, there is room for improvement in one's diet and eating healthier can bring this number down and potentially reduce one's risk of heart attack and/or stroke.  Your LDL level should be below 100. If you have diabetes or a possible heart problem, your LDL should be below 70.  Some strategies to focus on to help improve your LDL levels:  - Eat 20 to 30 grams of fiber every day.  - Eat Foods such as fruits and vegetables, whole grains, beans, peas, nuts, and seeds can help lower LDL. - Avoid Saturated fats - Dairy foods - such as butter, cream, ghee, regular-fat milk and cheese. Meat - such as fatty cuts of beef, pork and lamb, processed meats like salami, sausages and the skin on chicken. Lard., fatty snack foods, cakes, biscuits, pies and deep fried foods) - Avoid smoking   HDL removes extra cholesterol and plaque buildup in your arteries and then sends it to your liver to remove it from your body; this helps reduce your risk of heart disease, heart attack, and stroke.  Foods that increase HDL include beans and legumes, whole grains, high-fiber fruits:prunes, apples, and pears; fatty fish- salmon, tuna, sardines; nuts, olive oil.   Preventive Care 18-39 Years, Female Preventive care refers to lifestyle choices and visits with your health care provider that can promote health and wellness. What does preventive care include?   A yearly physical exam. This is also called an annual well check.  Dental exams once or twice a year.  Routine eye exams. Ask your health care provider how often you should have your eyes checked.  Personal lifestyle  choices, including: ? Daily care of your teeth and gums. ? Regular physical activity. ? Eating a healthy diet. ? Avoiding tobacco and drug use. ? Limiting alcohol use. ? Practicing safe sex. ? Taking vitamin and mineral supplements as recommended by your health care provider. What happens during an annual well check? The services and screenings done by your health care provider during your annual well check will depend on your age, overall health, lifestyle risk factors, and family history of disease. Counseling Your health care provider may ask you questions about your:  Alcohol use.  Tobacco use.  Drug use.  Emotional well-being.  Home and relationship well-being.  Sexual activity.  Eating habits.  Work and work Statistician.  Method of birth control.  Menstrual cycle.  Pregnancy history. Screening You may have the following tests or measurements:  Height, weight, and BMI.  Diabetes screening. This is done by checking your blood sugar (glucose) after you have not eaten for a while (fasting).  Blood pressure.  Lipid and cholesterol levels. These may be checked every 5 years starting at age 26.  Skin check.  Hepatitis C blood test.  Hepatitis B blood test.  Sexually transmitted disease (STD) testing.  BRCA-related cancer screening. This may be done if you have a family history of breast, ovarian, tubal, or peritoneal cancers.  Pelvic exam and Pap test. This may be done every 3 years starting at age 29. Starting at age 34, this may be done every 5 years if  you have a Pap test in combination with an HPV test. Discuss your test results, treatment options, and if necessary, the need for more tests with your health care provider. Vaccines Your health care provider may recommend certain vaccines, such as:  Influenza vaccine. This is recommended every year.  Tetanus, diphtheria, and acellular pertussis (Tdap, Td) vaccine. You may need a Td booster every 10  years.  Varicella vaccine. You may need this if you have not been vaccinated.  HPV vaccine. If you are 78 or younger, you may need three doses over 6 months.  Measles, mumps, and rubella (MMR) vaccine. You may need at least one dose of MMR. You may also need a second dose.  Pneumococcal 13-valent conjugate (PCV13) vaccine. You may need this if you have certain conditions and were not previously vaccinated.  Pneumococcal polysaccharide (PPSV23) vaccine. You may need one or two doses if you smoke cigarettes or if you have certain conditions.  Meningococcal vaccine. One dose is recommended if you are age 20-21 years and a first-year college student living in a residence hall, or if you have one of several medical conditions. You may also need additional booster doses.  Hepatitis A vaccine. You may need this if you have certain conditions or if you travel or work in places where you may be exposed to hepatitis A.  Hepatitis B vaccine. You may need this if you have certain conditions or if you travel or work in places where you may be exposed to hepatitis B.  Haemophilus influenzae type b (Hib) vaccine. You may need this if you have certain risk factors. Talk to your health care provider about which screenings and vaccines you need and how often you need them. This information is not intended to replace advice given to you by your health care provider. Make sure you discuss any questions you have with your health care provider. Document Released: 05/26/2001 Document Revised: 11/10/2016 Document Reviewed: 01/29/2015 Elsevier Interactive Patient Education  2019 [employer].

## 2018-05-18 ENCOUNTER — Other Ambulatory Visit: Payer: Self-pay | Admitting: Family Medicine

## 2018-05-18 ENCOUNTER — Ambulatory Visit
Admission: RE | Admit: 2018-05-18 | Discharge: 2018-05-18 | Disposition: A | Discharge status: Home / Self Care | Payer: BLUE CROSS/BLUE SHIELD | Source: Ambulatory Visit | Attending: Family Medicine | Admitting: Family Medicine

## 2018-05-18 ENCOUNTER — Encounter: Payer: Self-pay | Admitting: Family Medicine

## 2018-05-18 ENCOUNTER — Ambulatory Visit: Payer: BLUE CROSS/BLUE SHIELD | Admitting: Urology

## 2018-05-18 ENCOUNTER — Ambulatory Visit: Payer: BLUE CROSS/BLUE SHIELD | Admitting: Family Medicine

## 2018-05-18 ENCOUNTER — Telehealth: Payer: Self-pay | Admitting: Family Medicine

## 2018-05-18 VITALS — BP 110/86 | HR 105 | Temp 98.2°F | Resp 16 | Ht 68.0 in | Wt 229.0 lb

## 2018-05-18 DIAGNOSIS — J4521 Mild intermittent asthma with (acute) exacerbation: Secondary | ICD-10-CM

## 2018-05-18 DIAGNOSIS — R0602 Shortness of breath: Secondary | ICD-10-CM | POA: Diagnosis not present

## 2018-05-18 DIAGNOSIS — Z8701 Personal history of pneumonia (recurrent): Secondary | ICD-10-CM | POA: Insufficient documentation

## 2018-05-18 DIAGNOSIS — Z8619 Personal history of other infectious and parasitic diseases: Secondary | ICD-10-CM

## 2018-05-18 DIAGNOSIS — R05 Cough: Secondary | ICD-10-CM | POA: Diagnosis not present

## 2018-05-18 MED ORDER — GUAIFENESIN ER 600 MG PO TB12: 600.0000 mg | ORAL_TABLET | Freq: Two times a day (BID) | ORAL | 0 refills | Status: DC | PRN

## 2018-05-18 MED ORDER — ALBUTEROL SULFATE (2.5 MG/3ML) 0.083% IN NEBU: 2.5000 mg | INHALATION_SOLUTION | Freq: Four times a day (QID) | RESPIRATORY_TRACT | 1 refills | Status: DC | PRN

## 2018-05-18 MED ORDER — DOXYCYCLINE HYCLATE 100 MG PO TABS: 100.0000 mg | ORAL_TABLET | Freq: Two times a day (BID) | ORAL | 0 refills | Status: AC

## 2018-05-18 MED ORDER — PROMETHAZINE-DM 6.25-15 MG/5ML PO SYRP: 5.0000 mL | ORAL_SOLUTION | Freq: Four times a day (QID) | ORAL | 0 refills | Status: DC | PRN

## 2018-05-18 MED ORDER — BENZONATATE 100 MG PO CAPS: 100.0000 mg | ORAL_CAPSULE | Freq: Two times a day (BID) | ORAL | 0 refills | Status: DC | PRN

## 2018-05-18 MED ORDER — PREDNISONE 20 MG PO TABS: 20.0000 mg | ORAL_TABLET | Freq: Two times a day (BID) | ORAL | 0 refills | Status: AC

## 2018-05-18 NOTE — Telephone Encounter (Signed)
Tiffany calling to report chest xray results.  IMPRESSION: No active cardiopulmonary disease. Results of chest xray are available in Epic

## 2018-05-18 NOTE — Progress Notes (Signed)
Name: Jacqueline Fields   MRN: [id number]    DOB: 1991/03/09   Date:05/18/2018       Progress Note  Subjective  Chief Complaint  Chief Complaint  Patient presents with  . URI  . Back Pain    HPI  PT presents with concern for shortness of breath, congested cough for about 5 days.  Does endorse increased nasal drainage. No chest pain, but does have mid-back pain especially with deep inspiration. Endorses intermittent episodes of fevers/chills, mild nausea, diarrhea. No vomiting, abdominal pain, no dysuria.  She is urinating more often, but she is drinking a lot more water.  Patient Active Problem List   Diagnosis Date Noted  . Iron deficiency anemia 05/04/2018  . CAP (community acquired pneumonia) 03/01/2018  . Oligomenorrhea, unspecified 11/17/2017  . Cholecystitis 11/14/2015  . RUQ abdominal pain   . Normal labor 07/10/2015  . Irregular menses 11/30/2012  . Menstrual cramps 11/30/2012  . Routine general medical examination at a health care facility 10/11/2012  . Encounter for routine gynecological examination 10/11/2012  . Tremors of nervous system 08/27/2011  . Heart palpitations 08/27/2011  . Allergic asthma, mild intermittent, with acute exacerbation 08/27/2011  . Anemia   . Allergy   . Occasional tremors 07/17/2011  . Stuttering 07/17/2011    Social History   Tobacco Use  . Smoking status: Never Smoker  . Smokeless tobacco: Never Used  Substance Use Topics  . Alcohol use: No     Current Outpatient Medications:  .  albuterol (PROVENTIL HFA;VENTOLIN HFA) 108 (90 Base) MCG/ACT inhaler, Inhale 2-4 puffs by mouth every 4 hours as needed for wheezing, cough, and/or shortness of breath, Disp: 1 Inhaler, Rfl: 1 .  albuterol (PROVENTIL) (2.5 MG/3ML) 0.083% nebulizer solution, Take 3 mLs (2.5 mg total) by nebulization every 6 (six) hours as needed for wheezing or shortness of breath., Disp: 150 mL, Rfl: 1 .  benzonatate (TESSALON) 100 MG capsule, Take 1 capsule (100 mg  total) by mouth 2 (two) times daily as needed for cough., Disp: 20 capsule, Rfl: 0 .  cetirizine (ZYRTEC) 10 MG tablet, Take 10 mg by mouth daily., Disp: , Rfl:  .  Cyanocobalamin (B-12 PO), Take 1 tablet by mouth daily., Disp: , Rfl:  .  doxycycline (VIBRA-TABS) 100 MG tablet, Take 1 tablet (100 mg total) by mouth 2 (two) times daily for 7 days., Disp: 14 tablet, Rfl: 0 .  EPIPEN 2-PAK 0.3 MG/0.3ML SOAJ injection, Inject 1 mg into the muscle as needed. Reported on 07/05/2015, Disp: , Rfl:  .  ferrous sulfate 325 (65 FE) MG tablet, Take 1 tablet (325 mg total) by mouth 2 (two) times daily with a meal., Disp: 60 tablet, Rfl: 2 .  ferrous sulfate 325 (65 FE) MG tablet, Take 325 mg by mouth daily with breakfast., Disp: , Rfl:  .  guaiFENesin (MUCINEX) 600 MG 12 hr tablet, Take 1 tablet (600 mg total) by mouth 2 (two) times daily as needed for cough or to loosen phlegm., Disp: 20 tablet, Rfl: 0 .  predniSONE (DELTASONE) 20 MG tablet, Take 1 tablet (20 mg total) by mouth 2 (two) times daily with a meal for 5 days., Disp: 10 tablet, Rfl: 0 .  promethazine-dextromethorphan (PROMETHAZINE-DM) 6.25-15 MG/5ML syrup, Take 5 mLs by mouth 4 (four) times daily as needed for cough., Disp: 118 mL, Rfl: 0  No Known Allergies  I personally reviewed active problem list, medication list with the patient/caregiver today.  ROS  Ten systems reviewed and  is negative except as mentioned in HPI  Objective  Vitals:   05/18/18 1249  BP: 110/86  Pulse: (!) 105  Resp: 16  Temp: 98.2 F (36.8 C)  TempSrc: Oral  SpO2: 98%  Weight: 229 lb (103.9 kg)  Height: 5\' 8"  (1.727 m)   Body mass index is 34.82 kg/m.  Nursing Note and Vital Signs reviewed.  Physical Exam  Constitutional: Patient appears well-developed and well-nourished. Obese. No distress.  HEENT: head atraumatic, normocephalic, pupils equal and reactive to light, Bilateral TM's without erythema or effusion,  bilateral maxillary and frontal sinuses are  non-tender, neck supple without lymphadenopathy, throat within normal limits - no erythema or exudate, no tonsillar swelling Cardiovascular: Normal rate, regular rhythm and normal heart sounds.  No murmur heard. No BLE edema. Pulmonary/Chest: Effort normal and breath sounds clear bilaterally with some diminished breath sounds at the bases. No respiratory distress. Psychiatric: Patient has a normal mood and affect. behavior is normal. Judgment and thought content normal.  No results found for this or any previous visit (from the past 72 hour(s)).  Assessment & Plan  1. Shortness of breath - DG Chest 2 View; Future - albuterol (PROVENTIL) (2.5 MG/3ML) 0.083% nebulizer solution; Take 3 mLs (2.5 mg total) by nebulization every 6 (six) hours as needed for wheezing or shortness of breath.  Dispense: 150 mL; Refill: 1 - DME Nebulizer machine - predniSONE (DELTASONE) 20 MG tablet; Take 1 tablet (20 mg total) by mouth 2 (two) times daily with a meal for 5 days.  Dispense: 10 tablet; Refill: 0 - Will consider pulmonology referral if CXR negative for further evaluation.  Will treat for PNA today as she has had negative CXR with +PNA on Ct scan in the past when she was hospitalized for sepsis.  2. Personal history of sepsis - DG Chest 2 View; Future  3. History of pneumonia - DG Chest 2 View; Future  4. Mild intermittent asthma with acute exacerbation - albuterol (PROVENTIL) (2.5 MG/3ML) 0.083% nebulizer solution; Take 3 mLs (2.5 mg total) by nebulization every 6 (six) hours as needed for wheezing or shortness of breath.  Dispense: 150 mL; Refill: 1 - DME Nebulizer machine - predniSONE (DELTASONE) 20 MG tablet; Take 1 tablet (20 mg total) by mouth 2 (two) times daily with a meal for 5 days.  Dispense: 10 tablet; Refill: 0 - doxycycline (VIBRA-TABS) 100 MG tablet; Take 1 tablet (100 mg total) by mouth 2 (two) times daily for 7 days.  Dispense: 14 tablet; Refill: 0  -Red flags and when to present for  emergency care or RTC including fever >101.72F, chest pain, shortness of breath, new/worsening/un-resolving symptoms, reviewed with patient at time of visit. Follow up and care instructions discussed and provided in AVS.

## 2018-05-18 NOTE — Telephone Encounter (Signed)
Patient was notified by Raelyn Ensign on her Mychart her X-Ray was normal.

## 2018-05-31 NOTE — Progress Notes (Signed)
[facility] Consultation      Assessment and Plan:  Asthmatic bronchitis.  History of exercise-induced asthma. Status post pneumonia. - We will start Symbicort inhaler 2 puffs twice daily. - Continue Zyrtec.  GERD. - Persistent daily/frequent symptoms, may be contributing to asthma. -Start omeprazole 20 mg once daily, discussed lifestyle changes.  Meds ordered this encounter  Medications  . budesonide-formoterol (SYMBICORT) 160-4.5 MCG/ACT inhaler    Sig: Inhale 2 puffs into the lungs 2 (two) times daily. Rinse mouth after use    Dispense:  1 Inhaler    Refill:  12   Asked to return in 4-6 weeks if not improved and we would consider further imaging/workup.    Date: 06/01/2018  MRN# [id number] Jacqueline Fields 1990/10/21   Jacqueline Fields is a 28 y.o. old female seen in consultation for chief complaint of:    Chief Complaint  Patient presents with  . Consult    referred by Dr. Uvaldo Rising  . Pneumonia    had pneumonia in november, retreating now had prednisone dose pak  . Shortness of Breath    takes longer to get breath back  . Wheezing    when laying down   . Chest Pain    with deep breath    HPI:   The patient is a 28 year old female with a history of asthma. She had pneumonia back in November, she was seen in the office, she was treated with abx, prednisone. She felt better with that but not back to normal. Since that she has been winded with dyspnea on exertion. The cough persists, she takes cough drops which helps.  She has albuterol which she uses about once per week with helps.  She has a dog not in bedroom. She is a non-smoker, no occupational exposures. She works in [employer].   She was diagnosed with EIA before soccer in the fall but has not had to do that since 28 yo.  She snores at night, denies daytime sleepiness.  She does have bad acid reflux lately, it is present much of the day. She has drink water all day to suppress the reflux. She does  not take anything for it. Last meal around 7 pm, goes to be around 10:30 pm.  She has had allergy testing in the past with multiple positives including dust mites. She takes zyrtec, in the past she has been on singulair which did not help, and describes herself as "immune" to it.   **CT chest 03/02/2018, chest x-ray 05/18/2018>> imaging personally reviewed, on CT chest there is some groundglass changes in the right middle lobe suggestive of infection.  Chest x-ray is normal.   PMHX:   Past Medical History:  Diagnosis Date  . Allergy   . Anemia   . Iron deficiency anemia 05/04/2018   Surgical Hx:  Past Surgical History:  Procedure Laterality Date  . ADENOIDECTOMY    . CHOLECYSTECTOMY N/A 11/15/2015   Procedure: LAPAROSCOPIC CHOLECYSTECTOMY WITH INTRAOPERATIVE CHOLANGIOGRAM;  Surgeon: Clayburn Pert, MD;  Location: [facility] ORS;  Service: General;  Laterality: N/A;  . COLONOSCOPY WITH PROPOFOL N/A 05/11/2017   Procedure: COLONOSCOPY WITH PROPOFOL;  Surgeon: Jonathon Bellows, MD;  Location: [facility] ENDOSCOPY;  Service: Gastroenterology;  Laterality: N/A;  . ESOPHAGOGASTRODUODENOSCOPY (EGD) WITH PROPOFOL N/A 05/11/2017   Procedure: ESOPHAGOGASTRODUODENOSCOPY (EGD) WITH PROPOFOL;  Surgeon: Jonathon Bellows, MD;  Location: [facility] ENDOSCOPY;  Service: Gastroenterology;  Laterality: N/A;   Family Hx:  Family History  Problem Relation Age of Onset  .  Diabetes Paternal Grandmother   . Cancer Maternal Grandfather        Pancreatic cancer, passed away froms stroke.  . Stroke Maternal Grandfather   . Heart attack Paternal Grandfather   . Breast cancer Maternal Aunt   . Lung cancer Maternal Aunt   . Non-Hodgkin's lymphoma Maternal Aunt   . Breast cancer Maternal Aunt   . Breast cancer Paternal Aunt   . Thyroid cancer Cousin   . Bladder Cancer Neg Hx   . Kidney cancer Neg Hx    Social Hx:   Social History   Tobacco Use  . Smoking status: Never Smoker  . Smokeless tobacco: Never Used  Substance Use Topics  .  Alcohol use: No  . Drug use: No   Medication:    Current Outpatient Medications:  .  albuterol (PROVENTIL HFA;VENTOLIN HFA) 108 (90 Base) MCG/ACT inhaler, Inhale 2-4 puffs by mouth every 4 hours as needed for wheezing, cough, and/or shortness of breath, Disp: 1 Inhaler, Rfl: 1 .  cetirizine (ZYRTEC) 10 MG tablet, Take 10 mg by mouth daily., Disp: , Rfl:  .  Cyanocobalamin (B-12 PO), Take 1 tablet by mouth daily., Disp: , Rfl:  .  ferrous sulfate 325 (65 FE) MG tablet, Take 1 tablet (325 mg total) by mouth 2 (two) times daily with a meal., Disp: 60 tablet, Rfl: 2   Allergies:  Patient has no known allergies.  Review of Systems: Gen:  Denies  fever, sweats, chills HEENT: Denies blurred vision, double vision. bleeds, sore throat Cvc:  No dizziness, chest pain. Resp:   Denies cough or sputum production, shortness of breath Gi: Denies swallowing difficulty, stomach pain. Gu:  Denies bladder incontinence, burning urine Ext:   No Joint pain, stiffness. Skin: No skin rash,  hives  Endoc:  No polyuria, polydipsia. Psych: No depression, insomnia. Other:  All other systems were reviewed with the patient and were negative other that what is mentioned in the HPI.   Physical Examination:   VS: BP 124/86 (BP Location: Left Arm, Cuff Size: Normal)   Pulse (!) 111   Ht 5\' 8"  (1.727 m)   Wt 232 lb 12.8 oz (105.6 kg)   SpO2 98%   BMI 35.40 kg/m   General Appearance: No distress  Neuro:without focal findings,  speech normal,  HEENT: PERRLA, EOM intact.   Pulmonary: normal breath sounds, No wheezing.  CardiovascularNormal S1,S2.  No m/r/g.   Abdomen: Benign, Soft, non-tender. Renal:  No costovertebral tenderness  GU:  No performed at this time. Endoc: No evident thyromegaly, no signs of acromegaly. Skin:   warm, no rashes, no ecchymosis  Extremities: normal, no cyanosis, clubbing.  Other findings:    LABORATORY PANEL:   CBC No results for input(s): WBC, HGB, HCT, PLT in the last 168  hours. ------------------------------------------------------------------------------------------------------------------  Chemistries  No results for input(s): NA, K, CL, CO2, GLUCOSE, BUN, CREATININE, CALCIUM, MG, AST, ALT, ALKPHOS, BILITOT in the last 168 hours.  Invalid input(s): GFRCGP ------------------------------------------------------------------------------------------------------------------  Cardiac Enzymes No results for input(s): TROPONINI in the last 168 hours. ------------------------------------------------------------  RADIOLOGY:  No results found.     Thank  you for the consultation and for allowing [facility] to assist in the care of your patient. Our recommendations are noted above.  Please contact us if we can be of further service.   Marda Stalker, M.D., F.C.C.P.  Board Certified in Internal Medicine, Pulmonary Medicine, [place], and Sleep Medicine.  [facility] Office  Number: [phone]   06/01/2018

## 2018-06-01 ENCOUNTER — Ambulatory Visit (INDEPENDENT_AMBULATORY_CARE_PROVIDER_SITE_OTHER): Payer: BLUE CROSS/BLUE SHIELD | Admitting: Internal Medicine

## 2018-06-01 ENCOUNTER — Encounter: Payer: Self-pay | Admitting: Internal Medicine

## 2018-06-01 VITALS — BP 124/86 | HR 111 | Ht 68.0 in | Wt 232.8 lb

## 2018-06-01 DIAGNOSIS — J4541 Moderate persistent asthma with (acute) exacerbation: Secondary | ICD-10-CM

## 2018-06-01 MED ORDER — BUDESONIDE-FORMOTEROL FUMARATE 160-4.5 MCG/ACT IN AERO: 2.0000 | INHALATION_SPRAY | Freq: Two times a day (BID) | RESPIRATORY_TRACT | 12 refills | Status: DC

## 2018-06-01 NOTE — Patient Instructions (Addendum)
Start symbicort 2 puffs twice per day, rinse mouth after use.  Continue zyrtec daily.  Start omeprazole (prilosec) 20 mg once daily.  Avoid eating or drinking 4 hours before bedtime.   Call back in 4-6 weeks if not improved.

## 2018-06-13 ENCOUNTER — Inpatient Hospital Stay: Payer: BLUE CROSS/BLUE SHIELD | Attending: Oncology

## 2018-06-13 DIAGNOSIS — D509 Iron deficiency anemia, unspecified: Secondary | ICD-10-CM | POA: Diagnosis not present

## 2018-06-13 DIAGNOSIS — D508 Other iron deficiency anemias: Secondary | ICD-10-CM

## 2018-06-13 DIAGNOSIS — E538 Deficiency of other specified B group vitamins: Secondary | ICD-10-CM | POA: Insufficient documentation

## 2018-06-13 DIAGNOSIS — D473 Essential (hemorrhagic) thrombocythemia: Secondary | ICD-10-CM | POA: Diagnosis not present

## 2018-06-13 DIAGNOSIS — D75839 Thrombocytosis, unspecified: Secondary | ICD-10-CM

## 2018-06-13 LAB — CBC WITH DIFFERENTIAL/PLATELET
Abs Immature Granulocytes: 0.06 10*3/uL (ref 0.00–0.07)
Basophils Absolute: 0.1 10*3/uL (ref 0.0–0.1)
Basophils Relative: 1 %
Eosinophils Absolute: 0.3 10*3/uL (ref 0.0–0.5)
Eosinophils Relative: 3 %
HCT: 37.1 % (ref 36.0–46.0)
Hemoglobin: 12.3 g/dL (ref 12.0–15.0)
Immature Granulocytes: 1 %
Lymphocytes Relative: 28 %
Lymphs Abs: 3 10*3/uL (ref 0.7–4.0)
MCH: 28.5 pg (ref 26.0–34.0)
MCHC: 33.2 g/dL (ref 30.0–36.0)
MCV: 86.1 fL (ref 80.0–100.0)
Monocytes Absolute: 0.6 10*3/uL (ref 0.1–1.0)
Monocytes Relative: 6 %
Neutro Abs: 6.8 10*3/uL (ref 1.7–7.7)
Neutrophils Relative %: 61 %
Platelets: 411 10*3/uL — ABNORMAL HIGH (ref 150–400)
RBC: 4.31 MIL/uL (ref 3.87–5.11)
RDW: 13.8 % (ref 11.5–15.5)
WBC: 10.9 10*3/uL — ABNORMAL HIGH (ref 4.0–10.5)
nRBC: 0 % (ref 0.0–0.2)

## 2018-06-13 LAB — IRON AND TIBC
Iron: 46 ug/dL (ref 28–170)
Saturation Ratios: 16 % (ref 10.4–31.8)
TIBC: 286 ug/dL (ref 250–450)
UIBC: 240 ug/dL

## 2018-06-13 LAB — VITAMIN B12: Vitamin B-12: 478 pg/mL (ref 180–914)

## 2018-06-13 LAB — FERRITIN: Ferritin: 95 ng/mL (ref 11–307)

## 2018-06-13 LAB — FOLATE: Folate: 2.9 ng/mL — ABNORMAL LOW (ref >5.9)

## 2018-06-14 ENCOUNTER — Other Ambulatory Visit: Payer: Self-pay

## 2018-06-14 ENCOUNTER — Other Ambulatory Visit: Payer: BLUE CROSS/BLUE SHIELD

## 2018-06-14 ENCOUNTER — Inpatient Hospital Stay: Payer: BLUE CROSS/BLUE SHIELD | Admitting: Oncology

## 2018-06-14 ENCOUNTER — Encounter: Payer: Self-pay | Admitting: Oncology

## 2018-06-14 ENCOUNTER — Inpatient Hospital Stay: Payer: BLUE CROSS/BLUE SHIELD

## 2018-06-14 VITALS — BP 117/75 | HR 105 | Temp 97.2°F | Wt 231.2 lb

## 2018-06-14 DIAGNOSIS — D75839 Thrombocytosis, unspecified: Secondary | ICD-10-CM

## 2018-06-14 DIAGNOSIS — D509 Iron deficiency anemia, unspecified: Secondary | ICD-10-CM | POA: Diagnosis not present

## 2018-06-14 DIAGNOSIS — D473 Essential (hemorrhagic) thrombocythemia: Secondary | ICD-10-CM | POA: Diagnosis not present

## 2018-06-14 DIAGNOSIS — E538 Deficiency of other specified B group vitamins: Secondary | ICD-10-CM

## 2018-06-14 DIAGNOSIS — K929 Disease of digestive system, unspecified: Secondary | ICD-10-CM

## 2018-06-14 DIAGNOSIS — D508 Other iron deficiency anemias: Secondary | ICD-10-CM

## 2018-06-14 NOTE — Progress Notes (Signed)
Hematology/Oncology Consult note [facility] Telephone:([phone] [phone]   Patient Care Team: Hubbard Hartshorn, FNP as PCP - General (Family Medicine)  REFERRING PROVIDER: Hubbard Hartshorn, FNP CHIEF COMPLAINTS/REASON FOR VISIT:  Evaluation of thrombocytosis  HISTORY OF PRESENTING ILLNESS:  Jacqueline Fields is a  28 y.o.  female with PMH listed below who was referred to me for evaluation of thromcbocytosis  Reviewed patient's labs which were obtained by PCP on 04/01/2018..  Labs showed elevated platelet counts at  wbc 9.5 and hemoglobin 11.7, MCV 84.  Platelet counts 586.  Normal differential.  Reviewed patient's previous labs. Thrombocytosis onset is chronic onset , duration since at least 2017. No aggravating or elevated factors. Associated symptoms or signs:  Denies weight loss, fever, chills, fatigue, night sweats.  Context:  Smoking history: Denies Family history of polycythemia.  Denies History of iron deficiency anemia: She has history of iron deficiency.  Has been taking ferrous sulfate 325 mg every night for the past 3 years.  Menstrual period is irregular, low volume flow. She has had upper endoscopy and colonoscopy done on 05/11/2017 for evaluation of possible malabsorption. Exam were normal except internal hemorrhoids nonbleeding.  Random duodenum biopsy was negative for intraepithelial lymphocytosis, dysplasia and malignancy. History of DVT denies  Also reports that she has always had multiple bowel movements daily, usually she needs to have a bowel movement after every meal.  INTERVAL HISTORY Jacqueline Fields is a 28 y.o. female who has above history reviewed by me today presents for follow up visit for management of thrombocytosis Problems and complaints are listed below: She was found to have iron deficiency and she received 1 dose of Feraheme. She has mild infusion reaction including chest tightness, nausea and facial flush. She  received NS fluid, benadryl, decadron and she improved. Was able to finish feraheme treatment.   She reports feeling better after IV feraheme. Also informed me that she may be pregnant. She tested positive and is going to her doctor to confirm tomorrow.   Review of Systems  Constitutional: Negative for appetite change, chills, fatigue and fever.  HENT:   Negative for hearing loss and voice change.   Eyes: Negative for eye problems.  Respiratory: Negative for chest tightness and cough.   Cardiovascular: Negative for chest pain.  Gastrointestinal: Negative for abdominal distention, abdominal pain and blood in stool.       Multiple bowel movements daily.  Endocrine: Negative for hot flashes.  Genitourinary: Negative for difficulty urinating and frequency.   Musculoskeletal: Negative for arthralgias.  Skin: Negative for itching and rash.  Neurological: Negative for extremity weakness.  Hematological: Negative for adenopathy.  Psychiatric/Behavioral: Negative for confusion.    MEDICAL HISTORY:  Past Medical History:  Diagnosis Date  . Allergy   . Anemia   . Iron deficiency anemia 05/04/2018    SURGICAL HISTORY: Past Surgical History:  Procedure Laterality Date  . ADENOIDECTOMY    . CHOLECYSTECTOMY N/A 11/15/2015   Procedure: LAPAROSCOPIC CHOLECYSTECTOMY WITH INTRAOPERATIVE CHOLANGIOGRAM;  Surgeon: Clayburn Pert, MD;  Location: [facility] ORS;  Service: General;  Laterality: N/A;  . COLONOSCOPY WITH PROPOFOL N/A 05/11/2017   Procedure: COLONOSCOPY WITH PROPOFOL;  Surgeon: Jonathon Bellows, MD;  Location: [facility] ENDOSCOPY;  Service: Gastroenterology;  Laterality: N/A;  . ESOPHAGOGASTRODUODENOSCOPY (EGD) WITH PROPOFOL N/A 05/11/2017   Procedure: ESOPHAGOGASTRODUODENOSCOPY (EGD) WITH PROPOFOL;  Surgeon: Jonathon Bellows, MD;  Location: [facility] ENDOSCOPY;  Service: Gastroenterology;  Laterality: N/A;    SOCIAL HISTORY: Social History  Socioeconomic History  . Marital status: Single    Spouse name: Not  on file  . Number of children: 2  . Years of education: 21  . Highest education level: Some college, no degree  Occupational History  . Not on file  Social Needs  . Financial resource strain: Not hard at all  . Food insecurity:    Worry: Never true    Inability: Never true  . Transportation needs:    Medical: No    Non-medical: No  Tobacco Use  . Smoking status: Never Smoker  . Smokeless tobacco: Never Used  Substance and Sexual Activity  . Alcohol use: No  . Drug use: No  . Sexual activity: Yes    Partners: Male    Birth control/protection: None  Lifestyle  . Physical activity:    Days per week: 0 days    Minutes per session: 0 min  . Stress: Not at all  Relationships  . Social connections:    Talks on phone: More than three times a week    Gets together: More than three times a week    Attends religious service: More than 4 times per year    Active member of club or organization: No    Attends meetings of clubs or organizations: Never    Relationship status: Never married  . Intimate partner violence:    Fear of current or ex partner: No    Emotionally abused: No    Physically abused: No    Forced sexual activity: No  Other Topics Concern  . Not on file  Social History Narrative  . Not on file    FAMILY HISTORY: Family History  Problem Relation Age of Onset  . Diabetes Paternal Grandmother   . Cancer Maternal Grandfather        Pancreatic cancer, passed away froms stroke.  . Stroke Maternal Grandfather   . Heart attack Paternal Grandfather   . Breast cancer Maternal Aunt   . Lung cancer Maternal Aunt   . Non-Hodgkin's lymphoma Maternal Aunt   . Breast cancer Maternal Aunt   . Breast cancer Paternal Aunt   . Thyroid cancer Cousin   . Bladder Cancer Neg Hx   . Kidney cancer Neg Hx     ALLERGIES:  has No Known Allergies.  MEDICATIONS:  Current Outpatient Medications  Medication Sig Dispense Refill  . albuterol (PROVENTIL HFA;VENTOLIN HFA) 108 (90  Base) MCG/ACT inhaler Inhale 2-4 puffs by mouth every 4 hours as needed for wheezing, cough, and/or shortness of breath 1 Inhaler 1  . cetirizine (ZYRTEC) 10 MG tablet Take 10 mg by mouth daily.    . Cyanocobalamin (B-12 PO) Take 1 tablet by mouth daily.    . budesonide-formoterol (SYMBICORT) 160-4.5 MCG/ACT inhaler Inhale 2 puffs into the lungs 2 (two) times daily. Rinse mouth after use (Patient not taking: Reported on 06/14/2018) 1 Inhaler 12  . ferrous sulfate 325 (65 FE) MG tablet Take 1 tablet (325 mg total) by mouth 2 (two) times daily with a meal. 60 tablet 2   No current facility-administered medications for this visit.      PHYSICAL EXAMINATION: ECOG PERFORMANCE STATUS: 0 - Asymptomatic Vitals:   06/14/18 1026  BP: 117/75  Pulse: (!) 105  Temp: (!) 97.2 F (36.2 C)   Filed Weights   06/14/18 1026  Weight: 231 lb 3.2 oz (104.9 kg)    Physical Exam Constitutional:      General: She is not in acute distress.  Comments: obese  HENT:     Head: Normocephalic and atraumatic.  Eyes:     General: No scleral icterus.    Pupils: Pupils are equal, round, and reactive to light.  Neck:     Musculoskeletal: Normal range of motion and neck supple.  Cardiovascular:     Rate and Rhythm: Normal rate and regular rhythm.     Heart sounds: Normal heart sounds.  Pulmonary:     Effort: Pulmonary effort is normal. No respiratory distress.     Breath sounds: No wheezing.  Abdominal:     General: Bowel sounds are normal. There is no distension.     Palpations: Abdomen is soft. There is no mass.     Tenderness: There is no abdominal tenderness.  Musculoskeletal: Normal range of motion.        General: No deformity.  Skin:    General: Skin is warm and dry.     Findings: No erythema or rash.  Neurological:     Mental Status: She is alert and oriented to person, place, and time.     Cranial Nerves: No cranial nerve deficit.     Coordination: Coordination normal.  Psychiatric:         Behavior: Behavior normal.        Thought Content: Thought content normal.      LABORATORY DATA:  I have reviewed the data as listed Lab Results  Component Value Date   WBC 10.9 (H) 06/13/2018   HGB 12.3 06/13/2018   HCT 37.1 06/13/2018   MCV 86.1 06/13/2018   PLT 411 (H) 06/13/2018   Recent Labs    02/28/18 1929 03/02/18 0325 04/01/18 1356  NA 138 142 140  K 4.1 4.2 4.6  CL 105 110 104  CO2 25 25 27   GLUCOSE 152* 137* 80  BUN 6 7 8   CREATININE 0.62 0.43* 0.69  CALCIUM 9.3 8.8* 9.7  GFRNONAA >60 >60 120  GFRAA >60 >60 139  PROT  --   --  7.1  AST  --   --  21  ALT  --   --  20  BILITOT  --   --  0.2   Iron/TIBC/Ferritin/ %Sat    Component Value Date/Time   IRON 46 06/13/2018 1329   TIBC 286 06/13/2018 1329   FERRITIN 95 06/13/2018 1329   IRONPCTSAT 16 06/13/2018 1329        ASSESSMENT & PLAN:  1. Other iron deficiency anemia   2. Thrombocytosis ([place])   3. Vitamin B12 deficiency   4. Folate deficiency   5. Digestive disorder    # Labs are reviewed and discussed with patient.  Thrombocytosis has improved, platelet count decreased to 411.  Likely reactive to iron deficiency. May be also due to her underlying digestive disorders.  I also checked celiac panel today. Results pending.  Recommend patient to take oral iron supplement ferrous sulfate once daily for maintenance.   # Folate deficiency, recommend folic acid supplementation with 1mg  daily. She plans to start prenatal vitamins.  #History of vitamin B12 deficiency, she has been taking oral vitamin B12 supplements.  Vitamin B12 level is normal. Recommend patient to follow up in 3 months and have labs -cbc, iron tibc, ferritin, folate. In case she needs additional IV iron in the future, will switch to IV venofer if she is pregnant.    Orders Placed This Encounter  Procedures  . CBC with Differential/Platelet    Standing Status:   Future  Standing Expiration Date:   06/14/2019  . Ferritin     Standing Status:   Future    Standing Expiration Date:   06/14/2019  . Iron and TIBC    Standing Status:   Future    Standing Expiration Date:   06/14/2019  . Vitamin B12    Standing Status:   Future    Standing Expiration Date:   10/14/2018  . Folate    Standing Status:   Future    Standing Expiration Date:   06/14/2019    All questions were answered. The patient knows to call the clinic with any problems questions or concerns.  Return of visit:3 months.   Earlie Server, MD, PhD Hematology Oncology [facility] at [facility] Pager- [phone] 06/14/2018

## 2018-06-14 NOTE — Progress Notes (Signed)
No iron today per Dr. Tasia Catchings

## 2018-06-15 LAB — CELIAC PANEL 10
Antigliadin Abs, IgA: 7 units (ref 0–19)
Endomysial Ab, IgA: NEGATIVE
Gliadin IgG: 3 units (ref 0–19)
IgA: 122 mg/dL (ref 87–352)
Tissue Transglutaminase Ab, IgA: <2 U/mL (ref 0–3)

## 2018-06-22 ENCOUNTER — Encounter: Payer: Self-pay | Admitting: Obstetrics and Gynecology

## 2018-06-22 ENCOUNTER — Ambulatory Visit (INDEPENDENT_AMBULATORY_CARE_PROVIDER_SITE_OTHER): Payer: BLUE CROSS/BLUE SHIELD | Admitting: Obstetrics and Gynecology

## 2018-06-22 ENCOUNTER — Other Ambulatory Visit (HOSPITAL_COMMUNITY)
Admission: RE | Admit: 2018-06-22 | Discharge: 2018-06-22 | Disposition: A | Discharge status: Home / Self Care | Payer: BLUE CROSS/BLUE SHIELD | Source: Ambulatory Visit | Attending: Obstetrics and Gynecology | Admitting: Obstetrics and Gynecology

## 2018-06-22 ENCOUNTER — Other Ambulatory Visit: Payer: Self-pay

## 2018-06-22 VITALS — BP 110/70 | Wt 227.0 lb

## 2018-06-22 DIAGNOSIS — Z3687 Encounter for antenatal screening for uncertain dates: Secondary | ICD-10-CM | POA: Diagnosis not present

## 2018-06-22 DIAGNOSIS — Z3A08 8 weeks gestation of pregnancy: Secondary | ICD-10-CM

## 2018-06-22 DIAGNOSIS — Z3481 Encounter for supervision of other normal pregnancy, first trimester: Secondary | ICD-10-CM

## 2018-06-22 DIAGNOSIS — Z3400 Encounter for supervision of normal first pregnancy, unspecified trimester: Secondary | ICD-10-CM | POA: Diagnosis not present

## 2018-06-22 NOTE — Progress Notes (Signed)
NOB C/o nausea and vomiting, discharge.  Flu shot in November

## 2018-06-22 NOTE — Patient Instructions (Signed)
Vitamin B1 and B6 50-100mg  a day Unisom, 1 tab at night Ginger    Hyperemesis Gravidarum Hyperemesis gravidarum is a severe form of nausea and vomiting that happens during pregnancy. Hyperemesis is worse than morning sickness. It may cause you to have nausea or vomiting all day for many days. It may keep you from eating and drinking enough food and liquids, which can lead to dehydration, malnutrition, and weight loss. Hyperemesis usually occurs during the first half (the first 20 weeks) of pregnancy. It often goes away once a woman is in her second half of pregnancy. However, sometimes hyperemesis continues through an entire pregnancy. What are the causes? The cause of this condition is not known. It may be related to changes in chemicals (hormones) in the body during pregnancy, such as the high level of pregnancy hormone (human chorionic gonadotropin) or the increase in the female sex hormone (estrogen). What are the signs or symptoms? Symptoms of this condition include:  Nausea that does not go away.  Vomiting that does not allow you to keep any food down.  Weight loss.  Body fluid loss (dehydration).  Having no desire to eat, or not liking food that you have previously enjoyed. How is this diagnosed? This condition may be diagnosed based on:  A physical exam.  Your medical history.  Your symptoms.  Blood tests.  Urine tests. How is this treated? This condition is managed by controlling symptoms. This may include:  Following an eating plan. This can help lessen nausea and vomiting.  Taking prescription medicines. An eating plan and medicines are often used together to help control symptoms. If medicines do not help relieve nausea and vomiting, you may need to receive fluids through an IV at the hospital. Follow these instructions at home: Eating and drinking   Avoid the following: ? Drinking fluids with meals. Try not to drink anything during the 30 minutes before and  after your meals. ? Drinking more than 1 cup of fluid at a time. ? Eating foods that trigger your symptoms. These may include spicy foods, coffee, high-fat foods, very sweet foods, and acidic foods. ? Skipping meals. Nausea can be more intense on an empty stomach. If you cannot tolerate food, do not force it. Try sucking on ice chips or other frozen items and make up for missed calories later. ? Lying down within 2 hours after eating. ? Being exposed to environmental triggers. These may include food smells, smoky rooms, closed spaces, rooms with strong smells, warm or humid places, overly loud and noisy rooms, and rooms with motion or flickering lights. Try eating meals in a well-ventilated area that is free of strong smells. ? Quick and sudden changes in your movement. ? Taking iron pills and multivitamins that contain iron. If you take prescription iron pills, do not stop taking them unless your health care provider approves. ? Preparing food. The smell of food can spoil your appetite or trigger nausea.  To help relieve your symptoms: ? Listen to your body. Everyone is different and has different preferences. Find what works best for you. ? Eat and drink slowly. ? Eat 5-6 small meals daily instead of 3 large meals. Eating small meals and snacks can help you avoid an empty stomach. ? In the morning, before getting out of bed, eat a couple of crackers to avoid moving around on an empty stomach. ? Try eating starchy foods as these are usually tolerated well. Examples include cereal, toast, bread, potatoes, pasta, rice, and pretzels. ?  Include at least 1 serving of protein with your meals and snacks. Protein options include lean meats, poultry, seafood, beans, nuts, nut butters, eggs, cheese, and yogurt. ? Try eating a protein-rich snack before bed. Examples of a protein-rick snack include cheese and crackers or a peanut butter sandwich made with 1 slice of whole-wheat bread and 1 tsp (5 g) of peanut  butter. ? Eat or suck on things that have ginger in them. It may help relieve nausea. Add  tsp ground ginger to hot tea or choose ginger tea. ? Try drinking 100% fruit juice or an electrolyte drink. An electrolyte drink contains sodium, potassium, and chloride. ? Drink fluids that are cold, clear, and carbonated or sour. Examples include lemonade, ginger ale, lemon-lime soda, ice water, and sparkling water. ? Brush your teeth or use a mouth rinse after meals. ? Talk with your health care provider about starting a supplement of vitamin B6. General instructions  Take over-the-counter and prescription medicines only as told by your health care provider.  Follow instructions from your health care provider about eating or drinking restrictions.  Continue to take your prenatal vitamins as told by your health care provider. If you are having trouble taking your prenatal vitamins, talk with your health care provider about different options.  Keep all follow-up and pre-birth (prenatal) visits as told by your health care provider. This is important. Contact a health care provider if:  You have pain in your abdomen.  You have a severe headache.  You have vision problems.  You are losing weight.  You feel weak or dizzy. Get help right away if:  You cannot drink fluids without vomiting.  You vomit blood.  You have constant nausea and vomiting.  You are very weak.  You faint.  You have a fever and your symptoms suddenly get worse. Summary  Hyperemesis gravidarum is a severe form of nausea and vomiting that happens during pregnancy.  Making some changes to your eating habits may help relieve nausea and vomiting.  This condition may be managed with medicine.  If medicines do not help relieve nausea and vomiting, you may need to receive fluids through an IV at the hospital. This information is not intended to replace advice given to you by your health care provider. Make sure you  discuss any questions you have with your health care provider. Document Released: 03/30/2005 Document Revised: 04/19/2017 Document Reviewed: 11/27/2015 Elsevier Interactive Patient Education  2019 [employer].

## 2018-06-22 NOTE — Progress Notes (Signed)
06/22/2018   Chief Complaint: Missed period  Transfer of Care Patient: no  History of Present Illness: Ms. Borrayo is a 28 y.o. [id number] [redacted]w[redacted]d based on Patient's last menstrual period was 04/24/2018 (exact date). with an Estimated Date of Delivery: 01/29/19, with the above CC.   Her periods were: regular periods every 28 days She was using no method when she conceived.  She has Positive signs or symptoms of nausea/vomiting of pregnancy.  Able to tolerate small meals of carrots and grapes.. Able to drink water and pepsi. She has not lost weight. She feels nauseous all day but has only vomited 3 times total. Has not tried any medications to help.  She has Negative signs or symptoms of miscarriage or preterm labor She identifies Negative Zika risk factors for her and her partner On any different medications around the time she conceived/early pregnancy: No  History of varicella: Yes   ROS: A 12-point review of systems was performed and negative, except as stated in the above HPI.  OBGYN History: As per HPI. OB History  Gravida Para Term Preterm AB Living  3 2 2  0 0 2  SAB TAB Ectopic Multiple Live Births  0 0 0 0 2    # Outcome Date GA Lbr Len/2nd Weight Sex Delivery Anes PTL Lv  3 Current           2 Term 07/10/15 [redacted]w[redacted]d 02:40 / 00:04 8 lb (3.629 kg) M Vag-Spont None  LIV  1 Term 09/06/13 [redacted]w[redacted]d  7 lb 13 oz (3.544 kg) M Vag-Spont   LIV    Any issues with any prior pregnancies: no Any prior children are healthy, doing well, without any problems or issues: yes History of pap smears: Yes. Last pap smear 09/09/2016- NIL   History of STIs: No   Past Medical History: Past Medical History:  Diagnosis Date  . Allergy   . Anemia   . Iron deficiency anemia 05/04/2018  . Normal labor 07/10/2015  . Pneumonia 02/2018,05/2018    Past Surgical History: Past Surgical History:  Procedure Laterality Date  . ADENOIDECTOMY    . CHOLECYSTECTOMY N/A 11/15/2015   Procedure: LAPAROSCOPIC  CHOLECYSTECTOMY WITH INTRAOPERATIVE CHOLANGIOGRAM;  Surgeon: Clayburn Pert, MD;  Location: [facility] ORS;  Service: General;  Laterality: N/A;  . COLONOSCOPY WITH PROPOFOL N/A 05/11/2017   Procedure: COLONOSCOPY WITH PROPOFOL;  Surgeon: Jonathon Bellows, MD;  Location: [facility] ENDOSCOPY;  Service: Gastroenterology;  Laterality: N/A;  . ESOPHAGOGASTRODUODENOSCOPY (EGD) WITH PROPOFOL N/A 05/11/2017   Procedure: ESOPHAGOGASTRODUODENOSCOPY (EGD) WITH PROPOFOL;  Surgeon: Jonathon Bellows, MD;  Location: [facility] ENDOSCOPY;  Service: Gastroenterology;  Laterality: N/A;    Family History:  Family History  Problem Relation Age of Onset  . Diabetes Paternal Grandmother   . Cancer Maternal Grandfather        Pancreatic cancer, passed away froms stroke.  . Stroke Maternal Grandfather   . Pancreatic cancer Maternal Grandfather 74  . Heart attack Paternal Grandfather   . Lung cancer Maternal Aunt 71  . Non-Hodgkin's lymphoma Maternal Aunt 60  . Breast cancer Maternal Aunt 65  . Breast cancer Maternal Aunt   . Breast cancer Paternal Aunt   . Thyroid cancer Cousin   . Bladder Cancer Neg Hx   . Kidney cancer Neg Hx    She denies any female cancers, bleeding or blood clotting disorders.  She denies any history of mental retardation, birth defects or genetic disorders in her or the FOB's history  Social History:  Social History  Socioeconomic History  . Marital status: Single    Spouse name: Not on file  . Number of children: 2  . Years of education: 52  . Highest education level: Some college, no degree  Occupational History  . Not on file  Social Needs  . Financial resource strain: Not hard at all  . Food insecurity:    Worry: Never true    Inability: Never true  . Transportation needs:    Medical: No    Non-medical: No  Tobacco Use  . Smoking status: Never Smoker  . Smokeless tobacco: Never Used  Substance and Sexual Activity  . Alcohol use: No  . Drug use: No  . Sexual activity: Yes    Partners:  Male    Birth control/protection: None, Condom  Lifestyle  . Physical activity:    Days per week: 0 days    Minutes per session: 0 min  . Stress: Not at all  Relationships  . Social connections:    Talks on phone: More than three times a week    Gets together: More than three times a week    Attends religious service: More than 4 times per year    Active member of club or organization: No    Attends meetings of clubs or organizations: Never    Relationship status: Never married  . Intimate partner violence:    Fear of current or ex partner: No    Emotionally abused: No    Physically abused: No    Forced sexual activity: No  Other Topics Concern  . Not on file  Social History Narrative  . Not on file   Any pets in the household: yes Has dogs, knows to not change cat liter.  Allergy: No Known Allergies  Current Outpatient Medications:  Current Outpatient Medications:  .  albuterol (PROVENTIL HFA;VENTOLIN HFA) 108 (90 Base) MCG/ACT inhaler, Inhale 2-4 puffs by mouth every 4 hours as needed for wheezing, cough, and/or shortness of breath, Disp: 1 Inhaler, Rfl: 1 .  cetirizine (ZYRTEC) 10 MG tablet, Take 10 mg by mouth daily., Disp: , Rfl:  .  Cyanocobalamin (B-12 PO), Take 1 tablet by mouth daily., Disp: , Rfl:  .  ferrous sulfate 325 (65 FE) MG tablet, Take 1 tablet (325 mg total) by mouth 2 (two) times daily with a meal., Disp: 60 tablet, Rfl: 2 .  Prenatal MV-Min-FA-Omega-3 (PRENATAL GUMMIES/DHA & FA) 0.4-32.5 MG CHEW, Chew by mouth., Disp: , Rfl:    Physical Exam:   BP 110/70   Wt 227 lb (103 kg)   LMP 04/24/2018 (Exact Date)   BMI 34.52 kg/m  Body mass index is 34.52 kg/m. Constitutional: Well nourished, well developed female in no acute distress.  Neck:  Supple, normal appearance, and no thyromegaly  Cardiovascular: S1, S2 normal, no murmur, rub or gallop, regular rate and rhythm Respiratory:  Clear to auscultation bilateral. Normal respiratory effort Abdomen:  positive bowel sounds and no masses, hernias; diffusely non tender to palpation, non distended Breasts: breasts appear normal, no suspicious masses, no skin or nipple changes or axillary nodes. Neuro/Psych:  Normal mood and affect.  Skin:  Warm and dry.  Lymphatic:  No inguinal lymphadenopathy.   Pelvic exam: is limited by body habitus EGBUS: within normal limits, Vagina: within normal limits and with no blood in the vault, Cervix: normal appearing cervix without discharge or lesions, closed/long/high, Uterus:  nonenlarged, and Adnexa:  normal adnexa and no mass, fullness, tenderness  Assessment: Ms. Levay is a  28 y.o. G3O7564 [redacted]w[redacted]d based on Patient's last menstrual period was 04/24/2018 (exact date). with an Estimated Date of Delivery: 01/29/19,  for prenatal care.  Plan:  1) Avoid alcoholic beverages. 2) Patient encouraged not to smoke.  3) Discontinue the use of all non-medicinal drugs and chemicals.  4) Take prenatal vitamins daily.  5) Seatbelt use advised 6) Nutrition, food safety (fish, cheese advisories, and high nitrite foods) and exercise discussed. 7) Hospital and practice style delivering at [facility] discussed  8) Patient is asked about travel to areas at risk for the [place] virus, and counseled to avoid travel and exposure to mosquitoes or sexual partners who may have themselves been exposed to the virus. Testing is discussed, and will be ordered as appropriate.  9) Childbirth classes at [facility] advised 10) Genetic Screening, such as with 1st Trimester Screening, cell free fetal DNA, AFP testing, and Ultrasound, as well as with amniocentesis and CVS as appropriate, is discussed with patient. She plans to have genetic testing this pregnancy.  Bedside Transvaginal US performed, IUP 7 wks 4 days. Photos placed in media Will wait for official US to adjust EDC.  1GTT at next visit Having constant nausea, discussed B1 and B6 supplementation as well as unisom and ginger. Advised to let us know  if symptoms have not improved in a week.  Desires maternit21 testing  Problem list reviewed and updated.  Adrian Prows MD [facility], [facility] 06/22/2018 3:43 PM

## 2018-06-23 LAB — RPR+RH+ABO+RUB AB+AB SCR+CB...
Antibody Screen: NEGATIVE
HIV Screen 4th Generation wRfx: NONREACTIVE
Hematocrit: 36.2 % (ref 34.0–46.6)
Hemoglobin: 12.4 g/dL (ref 11.1–15.9)
Hepatitis B Surface Ag: NEGATIVE
MCH: 29.5 pg (ref 26.6–33.0)
MCHC: 34.3 g/dL (ref 31.5–35.7)
MCV: 86 fL (ref 79–97)
Platelets: 489 10*3/uL — ABNORMAL HIGH (ref 150–450)
RBC: 4.21 x10E6/uL (ref 3.77–5.28)
RDW: 13.6 % (ref 11.7–15.4)
RPR Ser Ql: NONREACTIVE
Rh Factor: NEGATIVE
Rubella Antibodies, IGG: 3.52 index (ref >0.99)
Varicella zoster IgG: 1404 index (ref >165)
WBC: 11.3 10*3/uL — ABNORMAL HIGH (ref 3.4–10.8)

## 2018-06-23 LAB — MONITOR DRUG PROFILE 10(MW)
AMPHETAMINE SCREEN URINE: NEGATIVE ng/mL
BARBITURATE SCREEN URINE: NEGATIVE ng/mL
BENZODIAZEPINE SCREEN, URINE: NEGATIVE ng/mL
CANNABINOIDS UR QL SCN: NEGATIVE ng/mL
COCAINE(METAB.)SCREEN, URINE: NEGATIVE ng/mL
Creatinine(Crt), U: 130.6 mg/dL (ref 20.0–300.0)
Methadone Screen, Urine: NEGATIVE ng/mL
OXYCODONE+OXYMORPHONE UR QL SCN: NEGATIVE ng/mL
Opiate Scrn, Ur: NEGATIVE ng/mL
Ph of Urine: 5.7 (ref 4.5–8.9)
Phencyclidine Qn, Ur: NEGATIVE ng/mL
Propoxyphene Scrn, Ur: NEGATIVE ng/mL

## 2018-06-23 LAB — CERVICOVAGINAL ANCILLARY ONLY
Chlamydia: NEGATIVE
Neisseria Gonorrhea: NEGATIVE

## 2018-06-24 LAB — URINE CULTURE

## 2018-06-30 ENCOUNTER — Other Ambulatory Visit: Payer: Self-pay | Admitting: Obstetrics and Gynecology

## 2018-06-30 DIAGNOSIS — Z3400 Encounter for supervision of normal first pregnancy, unspecified trimester: Secondary | ICD-10-CM

## 2018-06-30 DIAGNOSIS — Z3689 Encounter for other specified antenatal screening: Secondary | ICD-10-CM

## 2018-07-01 ENCOUNTER — Other Ambulatory Visit: Payer: Self-pay | Admitting: Advanced Practice Midwife

## 2018-07-01 DIAGNOSIS — Z3480 Encounter for supervision of other normal pregnancy, unspecified trimester: Secondary | ICD-10-CM

## 2018-07-01 NOTE — Progress Notes (Signed)
Patient to be rescheduled 2 weeks- dating scan and ROB

## 2018-07-06 ENCOUNTER — Ambulatory Visit: Admission: RE | Admit: 2018-07-06 | Payer: BLUE CROSS/BLUE SHIELD | Source: Ambulatory Visit

## 2018-07-06 ENCOUNTER — Encounter: Payer: BLUE CROSS/BLUE SHIELD | Admitting: Advanced Practice Midwife

## 2018-07-11 ENCOUNTER — Ambulatory Visit (INDEPENDENT_AMBULATORY_CARE_PROVIDER_SITE_OTHER): Payer: BLUE CROSS/BLUE SHIELD

## 2018-07-11 ENCOUNTER — Ambulatory Visit (INDEPENDENT_AMBULATORY_CARE_PROVIDER_SITE_OTHER): Payer: BLUE CROSS/BLUE SHIELD | Admitting: Obstetrics and Gynecology

## 2018-07-11 ENCOUNTER — Other Ambulatory Visit: Payer: Self-pay

## 2018-07-11 ENCOUNTER — Encounter: Payer: Self-pay | Admitting: Obstetrics and Gynecology

## 2018-07-11 VITALS — BP 118/74 | Wt 232.0 lb

## 2018-07-11 DIAGNOSIS — Z3480 Encounter for supervision of other normal pregnancy, unspecified trimester: Secondary | ICD-10-CM

## 2018-07-11 DIAGNOSIS — Z131 Encounter for screening for diabetes mellitus: Secondary | ICD-10-CM

## 2018-07-11 DIAGNOSIS — Z6834 Body mass index (BMI) 34.0-34.9, adult: Secondary | ICD-10-CM

## 2018-07-11 DIAGNOSIS — Z1379 Encounter for other screening for genetic and chromosomal anomalies: Secondary | ICD-10-CM

## 2018-07-11 DIAGNOSIS — Z3481 Encounter for supervision of other normal pregnancy, first trimester: Secondary | ICD-10-CM

## 2018-07-11 DIAGNOSIS — Z3687 Encounter for antenatal screening for uncertain dates: Secondary | ICD-10-CM | POA: Diagnosis not present

## 2018-07-11 DIAGNOSIS — Z3A11 11 weeks gestation of pregnancy: Secondary | ICD-10-CM

## 2018-07-11 NOTE — Progress Notes (Signed)
No vb. No lof. U/s today.  

## 2018-07-11 NOTE — Patient Instructions (Signed)
 Hello,  Given the current COVID-19 pandemic, our practice is making changes in how we are providing care to our patients. We are limiting in-person visits for the safety of all of our patients.   As a practice, we have met to discuss the best way to minimize visits, but still provide excellent care to our expecting mothers.  We have decided on the following visit structure for low-risk pregnancies.  Initial Pregnancy visit will be conducted as a telephone or web visit.  Between 10-14 weeks  there will be one in-person visit for an ultrasound, lab work, and genetic screening. 20 weeks in-person visit with an anatomy ultrasound  28 weeks in-person office visit for a 1-hour glucose test and a TDAP vaccination 32 weeks in-person office visit 34 weeks telephone visit 36 weeks in-person office visit for GBS, chlamydia, and gonorrhea testing 38 weeks in-person office visit 40 weeks in-person office visit  Understandably, some patients will require more visits than what is outlined above. Additional visits will be determined on a case-by-case basis.   We will, as always, be available for emergencies or to address concerns that might arise between in-person visits. We ask that you allow us the opportunity to address any concerns over the phone or through a virtual visit first. We will be available to return your phone calls throughout the day.   If you are able to purchase a scale, a blood pressure machine, and a home fetal doppler visits could be limited further. This will help decrease your exposure risks, but these purchases are not a necessity.   Things seem to change daily and there is the possibility that this structure could change, please be patient as we adapt to a new way of caring for patients.   Thank you for trusting us with your prenatal care. Our practice values you and looks forward to providing you with excellent care.   Sincerely,   [facility], [facility]      COVID-19 and Your Pregnancy FAQ  How can I prevent infection with COVID-19 during my pregnancy? Social distancing is key. Please limit any interactions in public. Try and work from home if possible. Frequently wash your hands after touching possibly contaminated surfaces. Avoid touching your face.  Minimize trips to the store. Consider online ordering when possible.   Should I wear a mask? Masks should only be worn by those experiencing symptoms of COVID-19 or those with confirmed COVID-19 when they are in public or around other individuals.  What are the symptoms of COVID-19? Fever (greater than 100.4 F), dry cough, shortness of breath.  Am I more at risk for COVID-19 since I am pregnant? There is not currently data showing that pregnant women are more adversely impacted by COVID-19 than the general population. However, we know that pregnant women tend to have worse respiratory complications from similar diseases such as the flu and SARS and for this reason should be considered an at-risk population.  What do I do if I am experiencing the symptoms of COVID-19? Testing is being limited because of test availability. If you are experiencing symptoms you should quarantine yourself, and the members of your family, for at least 2 weeks at home.   Please visit this website for more information: https://www.cdc.gov/coronavirus/2019-ncov/if-you-are-sick/steps-when-sick.html  When should I go to the Emergency Room? Please go to the emergency room if you are experiencing ANY of these symptoms*:  1.    Difficulty breathing or shortness of breath 2.      Persistent pain or pressure in the chest 3.    Confusion or difficulty being aroused (or awakened) 4.    Bluish lips or face  *This list is not all inclusive. Please consult our office for any other symptoms that are severe or concerning.  What do I do if I am having difficulty breathing? You should go to the Emergency Room for  evaluation. At this time they have a tent set up for evaluating patients with COVID-19 symptoms.   How will my prenatal care be different because of the COVID-19 pandemic? It has been recommended to reduce the frequency of face-to-face visits and use resources such as telephone and virtual visits when possible. Using a scale, blood pressure machine and fetal doppler at home can further help reduce face-to-face visits. You will be provided with additional information on this topic.  We ask that you come to your visits alone to minimize potential exposures to  COVID-19.  How can I receive childbirth education? At this time in-person classes have been cancelled. You can register for online childbirth education, breastfeeding, and newborn care classes.  Please visit:  [email or web address] for more information  How will my hospital birth experience be different? The hospital is currently limiting visitors. This means that while you are in labor you can only have one person at the hospital with you. Additional family members will not be allowed to wait in the building or outside your room. Your one support person can be the father of the baby, a relative, a doula, or a friend. Once one support person is designated that person will wear a band. This band cannot be shared with multiple people.  How long will I stay in the hospital for after giving birth? It is also recommended that discharge home be expedited during the COVID-19 outbreak. This means staying for 1 day after a vaginal delivery and 2 days after a cesarean section.  What if I have COVID-19 and I am in labor? We ask that you wear a mask while on labor and delivery. We will try and accommodate you being placed in a room that is capable of filtering the air. Please call ahead if you are in labor and on your way to the hospital. The phone number for labor and delivery at [facility] is [phone].  If I have  COVID-19 when my baby is born how can I prevent my baby from contracting COVID-19? This is an issue that will have to be discussed on a case-by-case basis. Current recommendations suggest providing separate isolation rooms for both the mother and new infant as well as limiting visitors. However, there are practical challenges to this recommendation. The situation will assuredly change and decisions will be influenced by the desires of the mother and availability of space.  Some suggestions are the use of a curtain or physical barrier between mom and infant, hand hygiene, mom wearing a mask, or 6 feet of spacing between a mom and infant.   Can I breastfeed during the COVID-19 pandemic?   Yes, breastfeeding is encouraged.  Can I breastfeed if I have COVID-19? Yes. Covid-19 has not been found in breast milk. This means you cannot give COVID-19 to your child through breast milk. Breast feeding will also help pass antibodies to fight infection to your baby.   What precautions should I take when breastfeeding if I have COVID-19? If a mother and newborn do room-in and the mother wishes to feed at the breast, she should   put on a facemask and practice hand hygiene before each feeding.  What precautions should I take when pumping if I have COVID-19? Prior to expressing breast milk, mothers should practice hand hygiene. After each pumping session, all parts that come into contact with breast milk should be thoroughly washed and the entire pump should be appropriately disinfected per the manufacturer's instructions. This expressed breast milk should be fed to the newborn by a healthy caregiver.  What if I am pregnant and work in healthcare? Based on limited data regarding COVID-19 and pregnancy, ACOG currently does not propose creating additional restrictions on pregnant health care personnel because of COVID-19 alone. Pregnant women do not appear to be at higher risk of severe disease related to COVID-19.  Pregnant health care personnel should follow CDC risk assessment and infection control guidelines for health care personnel exposed to patients with suspected or confirmed COVID-19. Adherence to recommended infection prevention and control practices is an important part of protecting all health care personnel in health care settings.    Information on COVID-19 in pregnancy is very limited; however, facilities may want to consider limiting exposure of pregnant health care personnel to patients with confirmed or suspected COVID-19 infection, especially during higher-risk procedures (eg, aerosol-generating procedures), if feasible, based on staffing availability.    

## 2018-07-11 NOTE — Progress Notes (Signed)
Routine Prenatal Care Visit  Subjective  Jacqueline Fields is a 28 y.o. G3P2002 at [redacted]w[redacted]d being seen today for ongoing prenatal care.  She is currently monitored for the following issues for this low-risk pregnancy and has Tremors of nervous system; Anemia; Allergy; Heart palpitations; Allergic asthma, mild intermittent, with acute exacerbation; Routine general medical examination at a health care facility; Irregular menses; Menstrual cramps; Cholecystitis; RUQ abdominal pain; Occasional tremors; Stuttering; Oligomenorrhea, unspecified; CAP (community acquired pneumonia); Iron deficiency anemia; and Supervision of other normal pregnancy, antepartum on their problem list.  ----------------------------------------------------------------------------------- Patient reports no complaints.   Contractions: Not present. Vag. Bleeding: None.  Movement: Absent. Denies leaking of fluid.  ----------------------------------------------------------------------------------- The following portions of the patient's history were reviewed and updated as appropriate: allergies, current medications, past family history, past medical history, past social history, past surgical history and problem list. Problem list updated.   Objective  Blood pressure 118/74, weight 232 lb (105.2 kg), last menstrual period 04/24/2018. Pregravid weight 227 lb (103 kg) Total Weight Gain 5 lb (2.268 kg) Urinalysis:      Fetal Status: Fetal Heart Rate (bpm): 171   Movement: Absent     General:  Alert, oriented and cooperative. Patient is in no acute distress.  Skin: Skin is warm and dry. No rash noted.   Cardiovascular: Normal heart rate noted  Respiratory: Normal respiratory effort, no problems with respiration noted  Abdomen: Soft, gravid, appropriate for gestational age. Pain/Pressure: Absent     Pelvic:  Cervical exam deferred        Extremities: Normal range of motion.  Edema: None  Mental Status: Normal mood and affect.  Normal behavior. Normal judgment and thought content.   Assessment   28 y.o. [id number] at [redacted]w[redacted]d by  01/29/2019, by Last Menstrual Period presenting for routine prenatal visit  Plan   Preganacy #3 Problems (from 04/24/18 to present)    Problem Noted Resolved   Supervision of other normal pregnancy, antepartum 07/11/2018 by Homero Fellers, MD No   Overview Signed 07/11/2018  4:14 PM by Homero Fellers, MD      [facility]inic [facility] Prenatal Labs  Dating  LMP=10wk [place] Blood type: A/Negative/-- (03/11 1449)   Genetic Screen NIPS:   pending Antibody:Negative (03/11 1449)  Anatomic [place]  Rubella: 3.52 (03/11 1449)  Varicella: Immune  GTT Early:        28 wk:      RPR: Non Reactive (03/11 1449)   Rhogam  [ ]  HBsAg: Negative (03/11 1449)   TDaP vaccine                       HIV: Non Reactive (03/11 1449)   Flu Shot     2019                           GBS:   Contraception  Pap:2018 NIL  CBB     CS/VBAC    Baby Food    Support Person                Gestational age appropriate obstetric precautions including but not limited to vaginal bleeding, contractions, leaking of fluid and fetal movement were reviewed in detail with the patient.    [email or web address] today [email or web address] today Discussed COVID19 [place] and given FAQ- patient currently working from home.  Return in about 4 weeks (around 08/08/2018) for telephone visit [place].  Homero Fellers MD [facility], [facility] 07/11/2018, 4:26 PM

## 2018-07-12 LAB — HEMOGLOBIN A1C
Est. average glucose Bld gHb Est-mCnc: 103 mg/dL
Hgb A1c MFr Bld: 5.2 % (ref 4.8–5.6)

## 2018-07-16 LAB — MATERNIT 21 PLUS CORE, BLOOD
Fetal Fraction: 8
Result (T21): NEGATIVE
Trisomy 13 (Patau syndrome): NEGATIVE
Trisomy 18 (Edwards syndrome): NEGATIVE
Trisomy 21 (Down syndrome): NEGATIVE

## 2018-07-18 NOTE — Progress Notes (Signed)
Called and discussed with patient

## 2018-07-19 ENCOUNTER — Encounter: Payer: Self-pay | Admitting: Family Medicine

## 2018-07-20 ENCOUNTER — Telehealth: Payer: Self-pay | Admitting: Family Medicine

## 2018-07-20 ENCOUNTER — Encounter: Payer: Self-pay | Admitting: Emergency Medicine

## 2018-07-20 ENCOUNTER — Ambulatory Visit (INDEPENDENT_AMBULATORY_CARE_PROVIDER_SITE_OTHER): Payer: BLUE CROSS/BLUE SHIELD | Admitting: Family Medicine

## 2018-07-20 ENCOUNTER — Emergency Department
Admission: EM | Admit: 2018-07-20 | Discharge: 2018-07-20 | Disposition: A | Discharge status: Home / Self Care | Payer: BLUE CROSS/BLUE SHIELD | Attending: Emergency Medicine | Admitting: Emergency Medicine

## 2018-07-20 ENCOUNTER — Other Ambulatory Visit: Payer: Self-pay

## 2018-07-20 ENCOUNTER — Encounter: Payer: Self-pay | Admitting: Family Medicine

## 2018-07-20 DIAGNOSIS — R519 Headache, unspecified: Secondary | ICD-10-CM

## 2018-07-20 DIAGNOSIS — R51 Headache: Secondary | ICD-10-CM

## 2018-07-20 DIAGNOSIS — R197 Diarrhea, unspecified: Secondary | ICD-10-CM | POA: Diagnosis not present

## 2018-07-20 DIAGNOSIS — O99611 Diseases of the digestive system complicating pregnancy, first trimester: Secondary | ICD-10-CM | POA: Insufficient documentation

## 2018-07-20 DIAGNOSIS — Z3A12 12 weeks gestation of pregnancy: Secondary | ICD-10-CM | POA: Insufficient documentation

## 2018-07-20 DIAGNOSIS — W57XXXA Bitten or stung by nonvenomous insect and other nonvenomous arthropods, initial encounter: Secondary | ICD-10-CM

## 2018-07-20 DIAGNOSIS — O26891 Other specified pregnancy related conditions, first trimester: Secondary | ICD-10-CM | POA: Diagnosis not present

## 2018-07-20 LAB — COMPREHENSIVE METABOLIC PANEL
ALT: 31 U/L (ref 0–44)
AST: 32 U/L (ref 15–41)
Albumin: 3.8 g/dL (ref 3.5–5.0)
Alkaline Phosphatase: 62 U/L (ref 38–126)
Anion gap: 8 (ref 5–15)
BUN: <5 mg/dL — ABNORMAL LOW (ref 6–20)
CO2: 19 mmol/L — ABNORMAL LOW (ref 22–32)
Calcium: 8.8 mg/dL — ABNORMAL LOW (ref 8.9–10.3)
Chloride: 106 mmol/L (ref 98–111)
Creatinine, Ser: 0.45 mg/dL (ref 0.44–1.00)
GFR calc Af Amer: >60 mL/min (ref >60)
GFR calc non Af Amer: >60 mL/min (ref >60)
Glucose, Bld: 84 mg/dL (ref 70–99)
Potassium: 3.4 mmol/L — ABNORMAL LOW (ref 3.5–5.1)
Sodium: 133 mmol/L — ABNORMAL LOW (ref 135–145)
Total Bilirubin: 0.4 mg/dL (ref 0.3–1.2)
Total Protein: 7.7 g/dL (ref 6.5–8.1)

## 2018-07-20 LAB — URINALYSIS, COMPLETE (UACMP) WITH MICROSCOPIC
Bilirubin Urine: NEGATIVE
Glucose, UA: NEGATIVE mg/dL
Ketones, ur: NEGATIVE mg/dL
Leukocytes,Ua: NEGATIVE
Nitrite: NEGATIVE
Protein, ur: NEGATIVE mg/dL
Specific Gravity, Urine: 1.005 (ref 1.005–1.030)
pH: 6 (ref 5.0–8.0)

## 2018-07-20 LAB — CBC WITH DIFFERENTIAL/PLATELET
Abs Immature Granulocytes: 0.03 10*3/uL (ref 0.00–0.07)
Basophils Absolute: 0.1 10*3/uL (ref 0.0–0.1)
Basophils Relative: 1 %
Eosinophils Absolute: 0.2 10*3/uL (ref 0.0–0.5)
Eosinophils Relative: 2 %
HCT: 32.9 % — ABNORMAL LOW (ref 36.0–46.0)
Hemoglobin: 11.2 g/dL — ABNORMAL LOW (ref 12.0–15.0)
Immature Granulocytes: 0 %
Lymphocytes Relative: 30 %
Lymphs Abs: 2.9 10*3/uL (ref 0.7–4.0)
MCH: 29.2 pg (ref 26.0–34.0)
MCHC: 34 g/dL (ref 30.0–36.0)
MCV: 85.9 fL (ref 80.0–100.0)
Monocytes Absolute: 0.4 10*3/uL (ref 0.1–1.0)
Monocytes Relative: 5 %
Neutro Abs: 6 10*3/uL (ref 1.7–7.7)
Neutrophils Relative %: 62 %
Platelets: 360 10*3/uL (ref 150–400)
RBC: 3.83 MIL/uL — ABNORMAL LOW (ref 3.87–5.11)
RDW: 13.1 % (ref 11.5–15.5)
WBC: 9.6 10*3/uL (ref 4.0–10.5)
nRBC: 0 % (ref 0.0–0.2)

## 2018-07-20 LAB — HCG, QUANTITATIVE, PREGNANCY: hCG, Beta Chain, Quant, S: 92915 m[IU]/mL — ABNORMAL HIGH (ref <5)

## 2018-07-20 MED ORDER — ACETAMINOPHEN 500 MG PO TABS
1000.0000 mg | ORAL_TABLET | Freq: Once | ORAL | Status: AC
  Administered 2018-07-20: 14:00:00 1000 mg via ORAL
  Filled 2018-07-20: qty 2

## 2018-07-20 MED ORDER — METOCLOPRAMIDE HCL 10 MG PO TABS: 10.0000 mg | ORAL_TABLET | Freq: Three times a day (TID) | ORAL | 1 refills | Status: DC | PRN

## 2018-07-20 MED ORDER — SODIUM CHLORIDE 0.9 % IV SOLN
Freq: Once | INTRAVENOUS | Status: AC
  Administered 2018-07-20: 12:00:00 via INTRAVENOUS

## 2018-07-20 MED ORDER — LOPERAMIDE HCL 2 MG PO CAPS
4.0000 mg | ORAL_CAPSULE | Freq: Once | ORAL | Status: AC
  Administered 2018-07-20: 14:00:00 4 mg via ORAL
  Filled 2018-07-20: qty 2

## 2018-07-20 MED ORDER — METOCLOPRAMIDE HCL 5 MG/ML IJ SOLN
10.0000 mg | Freq: Once | INTRAMUSCULAR | Status: AC
  Administered 2018-07-20: 12:00:00 10 mg via INTRAVENOUS
  Filled 2018-07-20: qty 2

## 2018-07-20 NOTE — ED Triage Notes (Signed)
Pt arrives with complaints of bilateral headache that started Monday. Pt reports sensitivity to light and sound. PT also reports diarrhea that started Monday. Pt reports "4 or 5" episodes of diarrhea in the last 24 hours. Pt had tick removed Monday.  Pt currently [redacted] weeks pregnant.

## 2018-07-20 NOTE — ED Provider Notes (Signed)
[facility] Emergency Department Provider Note       Time seen: ----------------------------------------- 11:26 AM on 07/20/2018 -----------------------------------------   I have reviewed the triage vital signs and the nursing notes.  HISTORY   Chief Complaint Headache    HPI Jacqueline Fields is a 28 y.o. female with a history of allergies, anemia, iron deficiency anemia, pneumonia who presents to the ED for bad headache that started on Monday.  Patient reports sensitivity to light and sound.  She also reports diarrhea that started on Monday, she has had 4-5 episodes of diarrhea in the past 24 hours.  She is currently [redacted] weeks pregnant, has not had vaginal bleeding or leakage of fluid.  Past Medical History:  Diagnosis Date  . Allergy   . Anemia   . Iron deficiency anemia 05/04/2018  . Normal labor 07/10/2015  . Pneumonia 02/2018,05/2018    Patient Active Problem List   Diagnosis Date Noted  . Supervision of other normal pregnancy, antepartum 07/11/2018  . Iron deficiency anemia 05/04/2018  . CAP (community acquired pneumonia) 03/01/2018  . Oligomenorrhea, unspecified 11/17/2017  . Cholecystitis 11/14/2015  . RUQ abdominal pain   . Irregular menses 11/30/2012  . Menstrual cramps 11/30/2012  . Routine general medical examination at a health care facility 10/11/2012  . Tremors of nervous system 08/27/2011  . Heart palpitations 08/27/2011  . Allergic asthma, mild intermittent, with acute exacerbation 08/27/2011  . Anemia   . Allergy   . Occasional tremors 07/17/2011  . Stuttering 07/17/2011    Past Surgical History:  Procedure Laterality Date  . ADENOIDECTOMY    . CHOLECYSTECTOMY N/A 11/15/2015   Procedure: LAPAROSCOPIC CHOLECYSTECTOMY WITH INTRAOPERATIVE CHOLANGIOGRAM;  Surgeon: Clayburn Pert, MD;  Location: [facility] ORS;  Service: General;  Laterality: N/A;  . COLONOSCOPY WITH PROPOFOL N/A 05/11/2017   Procedure: COLONOSCOPY WITH PROPOFOL;   Surgeon: Jonathon Bellows, MD;  Location: [facility] ENDOSCOPY;  Service: Gastroenterology;  Laterality: N/A;  . ESOPHAGOGASTRODUODENOSCOPY (EGD) WITH PROPOFOL N/A 05/11/2017   Procedure: ESOPHAGOGASTRODUODENOSCOPY (EGD) WITH PROPOFOL;  Surgeon: Jonathon Bellows, MD;  Location: [facility] ENDOSCOPY;  Service: Gastroenterology;  Laterality: N/A;    Allergies Patient has no known allergies.  Social History Social History   Tobacco Use  . Smoking status: Never Smoker  . Smokeless tobacco: Never Used  Substance Use Topics  . Alcohol use: No  . Drug use: No   Review of Systems Constitutional: Negative for fever. Cardiovascular: Negative for chest pain. Respiratory: Negative for shortness of breath. Gastrointestinal: Negative for abdominal pain, positive for diarrhea Musculoskeletal: Negative for back pain. Skin: Negative for rash. Neurological: Positive for headache  All systems negative/normal/unremarkable except as stated in the HPI  ____________________________________________   PHYSICAL EXAM:  VITAL SIGNS: ED Triage Vitals  Enc Vitals Group     BP 07/20/18 1117 126/86     Pulse Rate 07/20/18 1117 78     Resp 07/20/18 1117 18     Temp 07/20/18 1117 98.2 F (36.8 C)     Temp Source 07/20/18 1117 Oral     SpO2 07/20/18 1117 98 %     Weight 07/20/18 1118 220 lb (99.8 kg)     Height 07/20/18 1118 5\' 8"  (1.727 m)     Head Circumference --      Peak Flow --      Pain Score 07/20/18 1117 7     Pain Loc --      Pain Edu? --      Excl. in [place]? --  Constitutional: Alert and oriented. Well appearing and in no distress. Eyes: Conjunctivae are normal. Normal extraocular movements. ENT      Head: Normocephalic and atraumatic.      Nose: No congestion/rhinnorhea.      Mouth/Throat: Mucous membranes are moist.      Neck: No stridor. Cardiovascular: Normal rate, regular rhythm. No murmurs, rubs, or gallops. Respiratory: Normal respiratory effort without tachypnea nor retractions. Breath sounds  are clear and equal bilaterally. No wheezes/rales/rhonchi. Gastrointestinal: Soft and nontender. Normal bowel sounds Musculoskeletal: Nontender with normal range of motion in extremities. No lower extremity tenderness nor edema. Neurologic:  Normal speech and language. No gross focal neurologic deficits are appreciated.  Skin:  Skin is warm, dry and intact. No rash noted. Psychiatric: Mood and affect are normal. Speech and behavior are normal.  ____________________________________________  ED COURSE:  As part of my medical decision making, I reviewed the following data within the [place] History obtained from family if available, nursing notes, old chart and ekg, as well as notes from prior ED visits. Patient presented for headache and diarrhea, we will assess with labs and imaging as indicated at this time.   Procedures  Jacqueline Fields was evaluated in Emergency Department on 07/20/2018 for the symptoms described in the history of present illness. She was evaluated in the context of the global COVID-19 pandemic, which necessitated consideration that the patient might be at risk for infection with the SARS-CoV-2 virus that causes COVID-19. Institutional protocols and algorithms that pertain to the evaluation of patients at risk for COVID-19 are in a state of rapid change based on information released by regulatory bodies including the CDC and federal and state organizations. These policies and algorithms were followed during the patient's care in the ED.  ____________________________________________   LABS (pertinent positives/negatives)  Labs Reviewed  CBC WITH DIFFERENTIAL/PLATELET - Abnormal; Notable for the following components:      Result Value   RBC 3.83 (*)    Hemoglobin 11.2 (*)    HCT 32.9 (*)    All other components within normal limits  COMPREHENSIVE METABOLIC PANEL - Abnormal; Notable for the following components:   Sodium 133 (*)    Potassium 3.4 (*)     CO2 19 (*)    BUN <5 (*)    Calcium 8.8 (*)    All other components within normal limits  URINALYSIS, COMPLETE (UACMP) WITH MICROSCOPIC - Abnormal; Notable for the following components:   Color, Urine STRAW (*)    APPearance CLEAR (*)    Hgb urine dipstick SMALL (*)    Bacteria, UA RARE (*)    All other components within normal limits  HCG, QUANTITATIVE, PREGNANCY - Abnormal; Notable for the following components:   hCG, Beta Chain, Quant, S 92,915 (*)    All other components within normal limits    ___________________________________________   DIFFERENTIAL DIAGNOSIS   Dehydration, electrolyte abnormality, gastroenteritis, tension headache, migraine  FINAL ASSESSMENT AND PLAN  Headache, diarrhea   Plan: The patient had presented for headache and diarrhea in early pregnancy. Patient's labs were reassuring.  Headache was improved after fluids and IV Reglan.  She did not require any imaging during this visit.  She had resolution of her headache and is cleared for outpatient follow-up.   Laurence Aly, MD    Note: This note was generated in part or whole with voice recognition software. Voice recognition is usually quite accurate but there are transcription errors that can and very often  do occur. I apologize for any typographical errors that were not detected and corrected.     Earleen Newport, MD 07/20/18 [phone]

## 2018-07-20 NOTE — Progress Notes (Signed)
Name: Jacqueline Fields   MRN: [id number]    DOB: [birth year]   Date:07/20/2018       Progress Note  Subjective  Chief Complaint  Chief Complaint  Patient presents with  . Insect Bite    tick bite has had a headache since. Will not take anything due to being [redacted] weeks pregnant    I connected with [name] on 07/20/18 at 10:20 AM EDT by a video enabled telemedicine application and verified that I am speaking with the correct person using two identifiers.  I discussed the limitations of evaluation and management by telemedicine and the availability of in person appointments. The patient expressed understanding and agreed to proceed. Staff also discussed with the patient that there may be a patient responsible charge related to this service. Patient Location: Home Provider Location: Home Additional Individuals present: None  HPI  Pt presents with concern for insect bite and headache:  [place], Monday she realized she had a small tick on her and has had a lot of itching at the site (RIGHT Hip).  She notes migraine (photosensitivity and sound sensitivity) started yesterday, also having diarrhea and loss of appetite and fatigue.  She denies abdominal pain, fever, rash, chest pain, shortness of breath, or cough.  She is trying to drink plenty of water, going to bed very early.  She has been taking tylenol for her headache - no prior history of migraines.  Patient Active Problem List   Diagnosis Date Noted  . Supervision of other normal pregnancy, antepartum 07/11/2018  . Iron deficiency anemia 05/04/2018  . CAP (community acquired pneumonia) 03/01/2018  . Oligomenorrhea, unspecified 11/17/2017  . Cholecystitis 11/14/2015  . RUQ abdominal pain   . Irregular menses 11/30/2012  . Menstrual cramps 11/30/2012  . Routine general medical examination at a health care facility 10/11/2012  . Tremors of nervous system 08/27/2011  . Heart  palpitations 08/27/2011  . Allergic asthma, mild intermittent, with acute exacerbation 08/27/2011  . Anemia   . Allergy   . Occasional tremors 07/17/2011  . Stuttering 07/17/2011    Social History   Tobacco Use  . Smoking status: Never Smoker  . Smokeless tobacco: Never Used  Substance Use Topics  . Alcohol use: No     Current Outpatient Medications:  .  albuterol (PROVENTIL HFA;VENTOLIN HFA) 108 (90 Base) MCG/ACT inhaler, Inhale 2-4 puffs by mouth every 4 hours as needed for wheezing, cough, and/or shortness of breath, Disp: 1 Inhaler, Rfl: 1 .  cetirizine (ZYRTEC) 10 MG tablet, Take 10 mg by mouth daily., Disp: , Rfl:  .  Cyanocobalamin (B-12 PO), Take 1 tablet by mouth daily., Disp: , Rfl:  .  Prenatal MV-Min-FA-Omega-3 (PRENATAL GUMMIES/DHA & FA) 0.4-32.5 MG CHEW, Chew by mouth., Disp: , Rfl:  .  ferrous sulfate 325 (65 FE) MG tablet, Take 1 tablet (325 mg total) by mouth 2 (two) times daily with a meal., Disp: 60 tablet, Rfl: 2  No Known Allergies  I personally reviewed active problem list, medication list, allergies, notes from last encounter, lab results with the patient/caregiver today.  ROS  Ten systems reviewed and is negative except as mentioned in HPI.  Objective  Virtual encounter, vitals not obtained.  There is no height or weight on file to calculate BMI.  Nursing Note and Vital Signs reviewed.  Physical Exam  Constitutional: Patient appears well-developed and well-nourished. No distress.  HENT: Head: Normocephalic and atraumatic.  Neck: Normal range of motion with extension and flexion, though does note some stiffness with significant headache worsening with side to side head motion.  Able to touch chin-to-chest Pulmonary/Chest: Effort normal. No respiratory distress. Speaking in complete sentences Neurological: Pt is alert and oriented to person, place, and time. Coordination, speech and gait are normal.  Face is symmetric.  Psychiatric: Patient has a  normal mood and affect. behavior is normal. Judgment and thought content normal. Skin: Small area of erythema to the right hip, no other rash.  No results found for this or any previous visit (from the past 72 hour(s)).  Assessment & Plan  1. Acute intractable headache, unspecified headache type - Due to severity of pain with side-to-side head motion, recent tick bite, and new onset of significant headache with no previous history of migraine, coupled with her pregnancy status, I do recommend she present to ER for concern for meningeal abnormality/neurologic concerns.  Did discuss risk/benefit due to COVID-19 pandemic.  She does have mask that she will wear to enter ER, and her father will drive her.  2. Tick bite, initial encounter  - I discussed the assessment and treatment plan with the patient. The patient was provided an opportunity to ask questions and all were answered. The patient agreed with the plan and demonstrated an understanding of the instructions.  3. [redacted] weeks gestation of pregnancy - Pt is 12w 3 days pregnant, has had normal course thus far.  Is obtaining prenatal care through [facility] with recent visit on 07/11/2018  Did consult with Dr. Ancil Boozer (attending physician) via telephone and she is in agreement to send to ER for further evaluation due to severity of symptoms.  I provided 35 minutes of non-face-to-face time during this encounter.  Hubbard Hartshorn, FNP

## 2018-07-20 NOTE — Telephone Encounter (Signed)
Called to check in on patient - headache is much improved, is going to rest today.  Will give [place] a call if headache returns or if symptoms do not improve. ER visits and labs are reviewed, sodium and potassium were slightly low, hgb/hct were also slightly low.  These mild changes are likely secondary to some mild dehydration coupled with pregnancy - will only schedule recheck if symptoms ar enot resolving.

## 2018-08-08 ENCOUNTER — Encounter: Payer: Self-pay | Admitting: Obstetrics and Gynecology

## 2018-08-08 ENCOUNTER — Ambulatory Visit (INDEPENDENT_AMBULATORY_CARE_PROVIDER_SITE_OTHER): Payer: BLUE CROSS/BLUE SHIELD | Admitting: Obstetrics and Gynecology

## 2018-08-08 ENCOUNTER — Other Ambulatory Visit: Payer: Self-pay

## 2018-08-08 DIAGNOSIS — O26899 Other specified pregnancy related conditions, unspecified trimester: Secondary | ICD-10-CM | POA: Insufficient documentation

## 2018-08-08 DIAGNOSIS — Z3A15 15 weeks gestation of pregnancy: Secondary | ICD-10-CM

## 2018-08-08 DIAGNOSIS — Z3480 Encounter for supervision of other normal pregnancy, unspecified trimester: Secondary | ICD-10-CM

## 2018-08-08 DIAGNOSIS — Z6791 Unspecified blood type, Rh negative: Secondary | ICD-10-CM

## 2018-08-08 DIAGNOSIS — O26892 Other specified pregnancy related conditions, second trimester: Secondary | ICD-10-CM

## 2018-08-08 NOTE — Progress Notes (Signed)
Routine Prenatal Care Visit- Virtual Visit  Subjective   Virtual Visit via Telephone Note  I connected with Jacqueline Fields on 08/08/18 at 10:10 AM EDT by telephone and verified that I am speaking with the correct person using two identifiers.   I discussed the limitations, risks, security and privacy concerns of performing an evaluation and management service by telephone and the availability of in person appointments. I also discussed with the patient that there may be a patient responsible charge related to this service. The patient expressed understanding and agreed to proceed.  The patient was at home I spoke with the patient from my office. The names of people involved in this encounter were: no one besides me and the patient.   Jacqueline Fields is a 28 y.o. G3P2002 at [redacted]w[redacted]d being seen today for ongoing prenatal care.  She is currently monitored for the following issues for this high-risk pregnancy and has Tremors of nervous system; Anemia; Allergy; Heart palpitations; Allergic asthma, mild intermittent, with acute exacerbation; Routine general medical examination at a health care facility; Irregular menses; Menstrual cramps; Cholecystitis; RUQ abdominal pain; Occasional tremors; Stuttering; Oligomenorrhea, unspecified; CAP (community acquired pneumonia); Iron deficiency anemia; Supervision of other normal pregnancy, antepartum; and Rh negative state in antepartum period on their problem list.  ----------------------------------------------------------------------------------- Patient reports no complaints.    .  .   . Denies leaking of fluid.  ----------------------------------------------------------------------------------- The following portions of the patient's history were reviewed and updated as appropriate: allergies, current medications, past family history, past medical history, past social history, past surgical history and problem list. Problem list updated.   Objective   Last menstrual period 04/24/2018. Pregravid weight 227 lb (103 kg) Total Weight Gain 5 lb (2.268 kg) Urinalysis:      Fetal Status:           Physical Exam could not be performed. Because of the COVID-19 outbreak this visit was performed over the phone and not in person.   Assessment   28 y.o. G3P2002 at [redacted]w[redacted]d by  01/29/2019, by Last Menstrual Period presenting for routine prenatal visit  Plan   Preganacy #3 Problems (from 04/24/18 to present)    Problem Noted Resolved   Rh negative state in antepartum period 08/08/2018 by Will Bonnet, MD No   Overview Signed 08/08/2018 10:39 AM by Will Bonnet, MD    [ ]  rhogam 28 weeks [ ]  rhogam pp, prn (new FOB)      Supervision of other normal pregnancy, antepartum 07/11/2018 by Homero Fellers, MD No   Overview Signed 07/11/2018  4:14 PM by Homero Fellers, MD     Clinic [facility] Prenatal Labs  Dating  LMP=10wk [place] Blood type: A/Negative/-- (03/11 1449)   Genetic Screen NIPS:   pending Antibody:Negative (03/11 1449)  Anatomic [place]  Rubella: 3.52 (03/11 1449)  Varicella: Immune  GTT Early:        28 wk:      RPR: Non Reactive (03/11 1449)   Rhogam  [ ]  HBsAg: Negative (03/11 1449)   TDaP vaccine                       HIV: Non Reactive (03/11 1449)   Flu Shot     2019                           GBS:   Contraception  Pap:2018 NIL  CBB  CS/VBAC    Baby Food    Support Person             Gestational age appropriate obstetric precautions including but not limited to vaginal bleeding, contractions, leaking of fluid and fetal movement were reviewed in detail with the patient.    Follow Up Instructions: U/s for anatomy next time.    I discussed the assessment and treatment plan with the patient. The patient was provided an opportunity to ask questions and all were answered. The patient agreed with the plan and demonstrated an understanding of the instructions.   The patient was advised to call back or seek an  in-person evaluation if the symptoms worsen or if the condition fails to improve as anticipated.  I provided 14 minutes of non-face-to-face time during this encounter.  Return in about 4 weeks (around 09/05/2018) for U/S for anatomy and routine prenatal after.  Prentice Docker, MD  [facility], [facility] 08/08/2018 10:32 AM

## 2018-09-07 ENCOUNTER — Other Ambulatory Visit: Payer: Self-pay

## 2018-09-07 ENCOUNTER — Ambulatory Visit (INDEPENDENT_AMBULATORY_CARE_PROVIDER_SITE_OTHER): Payer: BLUE CROSS/BLUE SHIELD

## 2018-09-07 ENCOUNTER — Ambulatory Visit (INDEPENDENT_AMBULATORY_CARE_PROVIDER_SITE_OTHER): Payer: BLUE CROSS/BLUE SHIELD | Admitting: Obstetrics and Gynecology

## 2018-09-07 ENCOUNTER — Encounter: Payer: Self-pay | Admitting: Obstetrics and Gynecology

## 2018-09-07 VITALS — BP 122/84 | Wt 234.0 lb

## 2018-09-07 DIAGNOSIS — Z363 Encounter for antenatal screening for malformations: Secondary | ICD-10-CM

## 2018-09-07 DIAGNOSIS — O26899 Other specified pregnancy related conditions, unspecified trimester: Secondary | ICD-10-CM

## 2018-09-07 DIAGNOSIS — O26892 Other specified pregnancy related conditions, second trimester: Secondary | ICD-10-CM

## 2018-09-07 DIAGNOSIS — Z6791 Unspecified blood type, Rh negative: Secondary | ICD-10-CM

## 2018-09-07 DIAGNOSIS — Z3480 Encounter for supervision of other normal pregnancy, unspecified trimester: Secondary | ICD-10-CM

## 2018-09-07 DIAGNOSIS — Z3A19 19 weeks gestation of pregnancy: Secondary | ICD-10-CM

## 2018-09-07 NOTE — Progress Notes (Signed)
Routine Prenatal Care Visit  Subjective  Jacqueline Fields is a 28 y.o. G3P2002 at [redacted]w[redacted]d being seen today for ongoing prenatal care.  She is currently monitored for the following issues for this low-risk pregnancy and has Tremors of nervous system; Anemia; Allergy; Heart palpitations; Allergic asthma, mild intermittent, with acute exacerbation; Routine general medical examination at a health care facility; Irregular menses; Menstrual cramps; Cholecystitis; RUQ abdominal pain; Occasional tremors; Stuttering; Oligomenorrhea, unspecified; CAP (community acquired pneumonia); Iron deficiency anemia; Supervision of other normal pregnancy, antepartum; and Rh negative state in antepartum period on their problem list.  ----------------------------------------------------------------------------------- Patient reports no complaints.   Contractions: Not present. Vag. Bleeding: None.  Movement: Present. Denies leaking of fluid. Anatomy u/s today incomplete for spinal views.   ----------------------------------------------------------------------------------- The following portions of the patient's history were reviewed and updated as appropriate: allergies, current medications, past family history, past medical history, past social history, past surgical history and problem list. Problem list updated.   Objective  Blood pressure 122/84, weight 234 lb (106.1 kg), last menstrual period 04/24/2018. Pregravid weight 227 lb (103 kg) Total Weight Gain 7 lb (3.175 kg) Urinalysis: Urine Protein    Urine Glucose    Fetal Status: Fetal Heart Rate (bpm): present-normal   Movement: Present     General:  Alert, oriented and cooperative. Patient is in no acute distress.  Skin: Skin is warm and dry. No rash noted.   Cardiovascular: Normal heart rate noted  Respiratory: Normal respiratory effort, no problems with respiration noted  Abdomen: Soft, gravid, appropriate for gestational age. Pain/Pressure: Absent      Pelvic:  Cervical exam deferred        Extremities: Normal range of motion.  Edema: None  Mental Status: Normal mood and affect. Normal behavior. Normal judgment and thought content.   Imaging Results US Ob Comp + 14 Wk  Result Date: 09/07/2018 Patient Name: Jacqueline Fields DOB: 1990-10-20 MRN: [id number] ULTRASOUND REPORT Location: [facility] Date of Service: 09/07/2018 Indications:Anatomy Ultrasound Findings: Nelda [place] intrauterine pregnancy is visualized with FHR at 157 BPM. Biometrics give an (U/S) Gestational age of [redacted]w[redacted]d and an (U/S) EDD of 02/01/2019; this correlates with the clinically established Estimated Date of Delivery: 01/29/19 Fetal presentation is Variable. EFW: 257 g ( 9 oz). Placenta: anterior and posterior. Grade: 1 AFI: subjectively normal. Anatomic survey is complete and normal; Gender - female.  Right Ovary is normal in appearance. Left Ovary is normal appearance. Survey of the adnexa demonstrates no adnexal masses. There is no free peritoneal fluid in the cul de sac. Impression: 1. [redacted]w[redacted]d Viable Singleton Intrauterine pregnancy by U/S. 2. (U/S) EDD is consistent with Clinically established Estimated Date of Delivery: 01/29/19 . 3. Normal Anatomy Scan. 4. Limited spine. Recommendations: 1.Clinical correlation with the patient's History and Physical Exam. 2. Follow up spinal views Lillia Dallas, RDMS There is a singleton gestation with subjectively normal amniotic fluid volume. The fetal biometry correlates with established dating. Detailed evaluation of the fetal anatomy was performed.The fetal anatomical survey appears within normal limits within the resolution of ultrasound as described above.  Not all structures were able to be visualized on today's study.  It is recommended that a follow-up ultrasound be performed to complete visualization of the unobserved structures today.  It must be noted that a normal ultrasound is unable to rule out fetal aneuploidy nor is it able to detect  all possible malformations.    The ultrasound images and findings were reviewed by me and I agree with the  above report. Prentice Docker, MD, [facility], [facility] 09/07/2018 1:51 PM       Assessment   28 y.o. [id number] at [redacted]w[redacted]d by  01/29/2019, by Last Menstrual Period presenting for routine prenatal visit  Plan   Preganacy #3 Problems (from 04/24/18 to present)    Problem Noted Resolved   Rh negative state in antepartum period 08/08/2018 by Will Bonnet, MD No   Overview Signed 08/08/2018 10:39 AM by Will Bonnet, MD    [ ]  rhogam 28 weeks [ ]  rhogam pp, prn (new FOB)      Supervision of other normal pregnancy, antepartum 07/11/2018 by Homero Fellers, MD No   Overview Signed 07/11/2018  4:14 PM by Homero Fellers, MD      Clinic [facility] Prenatal Labs  Dating  LMP=10wk [place] Blood type: A/Negative/-- (03/11 1449)   Genetic Screen NIPS:   pending Antibody:Negative (03/11 1449)  Anatomic [place]  Rubella: 3.52 (03/11 1449)  Varicella: Immune  GTT Early:        28 wk:      RPR: Non Reactive (03/11 1449)   Rhogam  [ ]  HBsAg: Negative (03/11 1449)   TDaP vaccine                       HIV: Non Reactive (03/11 1449)   Flu Shot     2019                           GBS:   Contraception  Pap:2018 NIL  CBB     CS/VBAC    Baby Food    Support Person              Preterm labor symptoms and general obstetric precautions including but not limited to vaginal bleeding, contractions, leaking of fluid and fetal movement were reviewed in detail with the patient. Please refer to After Visit Summary for other counseling recommendations.   Return in about 3 weeks (around 09/28/2018) for U/S for anatomy completion and routine prenatal.  Prentice Docker, MD, [place] 09/07/2018 1:58 PM

## 2018-09-12 ENCOUNTER — Inpatient Hospital Stay: Payer: BLUE CROSS/BLUE SHIELD

## 2018-09-13 ENCOUNTER — Ambulatory Visit: Payer: BLUE CROSS/BLUE SHIELD

## 2018-09-13 ENCOUNTER — Ambulatory Visit: Payer: BLUE CROSS/BLUE SHIELD | Admitting: Oncology

## 2018-09-29 ENCOUNTER — Ambulatory Visit (INDEPENDENT_AMBULATORY_CARE_PROVIDER_SITE_OTHER): Payer: BC Managed Care – PPO | Admitting: Advanced Practice Midwife

## 2018-09-29 ENCOUNTER — Ambulatory Visit (INDEPENDENT_AMBULATORY_CARE_PROVIDER_SITE_OTHER): Payer: BC Managed Care – PPO

## 2018-09-29 ENCOUNTER — Other Ambulatory Visit: Payer: Self-pay

## 2018-09-29 ENCOUNTER — Encounter: Payer: Self-pay | Admitting: Advanced Practice Midwife

## 2018-09-29 VITALS — BP 104/68 | Wt 224.0 lb

## 2018-09-29 DIAGNOSIS — Z3480 Encounter for supervision of other normal pregnancy, unspecified trimester: Secondary | ICD-10-CM

## 2018-09-29 DIAGNOSIS — Z113 Encounter for screening for infections with a predominantly sexual mode of transmission: Secondary | ICD-10-CM

## 2018-09-29 DIAGNOSIS — Z3A22 22 weeks gestation of pregnancy: Secondary | ICD-10-CM

## 2018-09-29 DIAGNOSIS — Z3482 Encounter for supervision of other normal pregnancy, second trimester: Secondary | ICD-10-CM

## 2018-09-29 DIAGNOSIS — Z362 Encounter for other antenatal screening follow-up: Secondary | ICD-10-CM

## 2018-09-29 DIAGNOSIS — Z131 Encounter for screening for diabetes mellitus: Secondary | ICD-10-CM

## 2018-09-29 DIAGNOSIS — Z130 Encounter for screening for diseases of the blood and blood-forming organs and certain disorders involving the immune mechanism: Secondary | ICD-10-CM

## 2018-09-29 LAB — POCT URINALYSIS DIPSTICK OB
Glucose, UA: NEGATIVE
POC,PROTEIN,UA: NEGATIVE

## 2018-09-29 NOTE — Progress Notes (Signed)
Routine Prenatal Care Visit  Subjective  Jacqueline Fields is a 28 y.o. G3P2002 at [redacted]w[redacted]d being seen today for ongoing prenatal care.  She is currently monitored for the following issues for this low-risk pregnancy and has Tremors of nervous system; Anemia; Allergy; Heart palpitations; Allergic asthma, mild intermittent, with acute exacerbation; Routine general medical examination at a health care facility; Irregular menses; Menstrual cramps; Cholecystitis; RUQ abdominal pain; Occasional tremors; Stuttering; Oligomenorrhea, unspecified; CAP (community acquired pneumonia); Iron deficiency anemia; Supervision of other normal pregnancy, antepartum; and Rh negative state in antepartum period on their problem list.  ----------------------------------------------------------------------------------- Patient reports feeling well and she admits good fetal movement. She expresses some worry regarding potential health problems with the baby secondary to the problems that her nephew had. Discussed unlikely nature of rare condition occurring in her baby. Reassurance given that pregnancy is proceeding normally at this time.   Contractions: Not present. Vag. Bleeding: None.  Movement: Present. Denies leaking of fluid.  ----------------------------------------------------------------------------------- The following portions of the patient's history were reviewed and updated as appropriate: allergies, current medications, past family history, past medical history, past social history, past surgical history and problem list. Problem list updated.   Objective  Blood pressure 104/68, weight 224 lb (101.6 kg), last menstrual period 04/24/2018. Pregravid weight 227 lb (103 kg) Total Weight Gain -3 lb (-1.361 kg) Urinalysis: Urine Protein Negative  Urine Glucose Negative  Fetal Status: Fetal Heart Rate (bpm): 145 Fundal Height: 23 cm Movement: Present  Presentation: Vertex  Follow up ultrasound today: anatomy scan is  now complete  General:  Alert, oriented and cooperative. Patient is in no acute distress.  Skin: Skin is warm and dry. No rash noted.   Cardiovascular: Normal heart rate noted  Respiratory: Normal respiratory effort, no problems with respiration noted  Abdomen: Soft, gravid, appropriate for gestational age. Pain/Pressure: Absent     Pelvic:  Cervical exam deferred        Extremities: Normal range of motion.  Edema: None  Mental Status: Normal mood and affect. Normal behavior. Normal judgment and thought content.   Assessment   28 y.o. G3P2002 at [redacted]w[redacted]d by  01/29/2019, by Last Menstrual Period presenting for routine prenatal visit  Plan   Preganacy #3 Problems (from 04/24/18 to present)    Problem Noted Resolved   Rh negative state in antepartum period 08/08/2018 by Will Bonnet, MD No   Overview Signed 08/08/2018 10:39 AM by Will Bonnet, MD    [ ]  rhogam 28 weeks [ ]  rhogam pp, prn (new FOB)      Supervision of other normal pregnancy, antepartum 07/11/2018 by Homero Fellers, MD No   Overview Signed 07/11/2018  4:14 PM by Homero Fellers, MD      [facility] Prenatal Labs  Dating  LMP=10wk [place] Blood type: A/Negative/-- (03/11 1449)   Genetic Screen NIPS:   pending Antibody:Negative (03/11 1449)  Anatomic [place]  Rubella: 3.52 (03/11 1449)  Varicella: Immune  GTT Early:        28 wk:      RPR: Non Reactive (03/11 1449)   Rhogam  [ ]  HBsAg: Negative (03/11 1449)   TDaP vaccine                       HIV: Non Reactive (03/11 1449)   Flu Shot     2019  GBS:   Contraception  Pap:2018 NIL  CBB     CS/VBAC    Baby Food    Support Person                 Preterm labor symptoms and general obstetric precautions including but not limited to vaginal bleeding, contractions, leaking of fluid and fetal movement were reviewed in detail with the patient. Please refer to After Visit Summary for other counseling recommendations.   Return  in about 4 weeks (around 10/27/2018) for 28 wk labs and rob.  Rod Can, CNM 09/29/2018 8:50 AM

## 2018-09-29 NOTE — Progress Notes (Signed)
ROB °Follow up anatomy scan °

## 2018-10-28 ENCOUNTER — Ambulatory Visit (INDEPENDENT_AMBULATORY_CARE_PROVIDER_SITE_OTHER): Payer: BC Managed Care – PPO | Admitting: Maternal Newborn

## 2018-10-28 ENCOUNTER — Other Ambulatory Visit: Payer: Self-pay

## 2018-10-28 ENCOUNTER — Other Ambulatory Visit: Payer: BC Managed Care – PPO

## 2018-10-28 ENCOUNTER — Encounter: Payer: Self-pay | Admitting: Maternal Newborn

## 2018-10-28 VITALS — BP 114/78 | Wt 226.0 lb

## 2018-10-28 DIAGNOSIS — O26893 Other specified pregnancy related conditions, third trimester: Secondary | ICD-10-CM | POA: Diagnosis not present

## 2018-10-28 DIAGNOSIS — Z3480 Encounter for supervision of other normal pregnancy, unspecified trimester: Secondary | ICD-10-CM | POA: Diagnosis not present

## 2018-10-28 DIAGNOSIS — Z130 Encounter for screening for diseases of the blood and blood-forming organs and certain disorders involving the immune mechanism: Secondary | ICD-10-CM | POA: Diagnosis not present

## 2018-10-28 DIAGNOSIS — Z6791 Unspecified blood type, Rh negative: Secondary | ICD-10-CM | POA: Diagnosis not present

## 2018-10-28 DIAGNOSIS — Z3A28 28 weeks gestation of pregnancy: Secondary | ICD-10-CM

## 2018-10-28 DIAGNOSIS — Z131 Encounter for screening for diabetes mellitus: Secondary | ICD-10-CM

## 2018-10-28 DIAGNOSIS — Z113 Encounter for screening for infections with a predominantly sexual mode of transmission: Secondary | ICD-10-CM | POA: Diagnosis not present

## 2018-10-28 MED ORDER — RHO D IMMUNE GLOBULIN 1500 UNIT/2ML IJ SOSY
300.0000 ug | PREFILLED_SYRINGE | Freq: Once | INTRAMUSCULAR | Status: AC
  Administered 2018-10-28: 300 ug via INTRAMUSCULAR

## 2018-10-28 NOTE — Progress Notes (Signed)
28 week labs tpday, rhogam. No vb.no lof.

## 2018-10-28 NOTE — Progress Notes (Signed)
Routine Prenatal Care Visit  Subjective  Jacqueline Fields is a 28 y.o. G3P2002 at [redacted]w[redacted]d being seen today for ongoing prenatal care.  She is currently monitored for the following issues for this low-risk pregnancy and has Tremors of nervous system; Anemia; Allergy; Heart palpitations; Allergic asthma, mild intermittent, with acute exacerbation; Routine general medical examination at a health care facility; Irregular menses; Menstrual cramps; Cholecystitis; RUQ abdominal pain; Occasional tremors; Stuttering; Oligomenorrhea, unspecified; CAP (community acquired pneumonia); Iron deficiency anemia; Supervision of other normal pregnancy, antepartum; and Rh negative state in antepartum period on their problem list.  ----------------------------------------------------------------------------------- Patient reports occasional hip pain with changes in baby's position. Contractions: Not present. Vag. Bleeding: None.  Movement: Present. No leaking of fluid.  ----------------------------------------------------------------------------------- The following portions of the patient's history were reviewed and updated as appropriate: allergies, current medications, past family history, past medical history, past social history, past surgical history and problem list. Problem list updated.   Objective  Blood pressure 114/78, weight 226 lb (102.5 kg), last menstrual period 04/24/2018. Pregravid weight 227 lb (103 kg) Total Weight Gain -1 lb (-0.454 kg)  Fetal Status: Fetal Heart Rate (bpm): 148 Fundal Height: 29 cm Movement: Present     General:  Alert, oriented and cooperative. Patient is in no acute distress.  Skin: Skin is warm and dry. No rash noted.   Cardiovascular: Normal heart rate noted  Respiratory: Normal respiratory effort, no problems with respiration noted  Abdomen: Soft, gravid, appropriate for gestational age. Pain/Pressure: Absent     Pelvic:  Cervical exam deferred        Extremities:  Normal range of motion.  Edema: None  Mental Status: Normal mood and affect. Normal behavior. Normal judgment and thought content.     Assessment   28 y.o. [id number] at [redacted]w[redacted]d, EDD 01/29/2019 by Last Menstrual Period presenting for a routine prenatal visit.  Plan   Preganacy #3 Problems (from 04/24/18 to present)    Problem Noted Resolved   Rh negative state in antepartum period 08/08/2018 by Will Bonnet, MD No   Overview Signed 08/08/2018 10:39 AM by Will Bonnet, MD    [ ]  rhogam 28 weeks [ ]  rhogam pp, prn (new FOB)      Supervision of other normal pregnancy, antepartum 07/11/2018 by Homero Fellers, MD No   Overview Signed 07/11/2018  4:14 PM by Homero Fellers, MD      [facility] Prenatal Labs  Dating  LMP=10wk [place] Blood type: A/Negative/-- (03/11 1449)   Genetic Screen NIPS:   pending Antibody:Negative (03/11 1449)  Anatomic [place]  Rubella: 3.52 (03/11 1449)  Varicella: Immune  GTT Early:        28 wk:      RPR: Non Reactive (03/11 1449)   Rhogam  [ ]  HBsAg: Negative (03/11 1449)   TDaP vaccine                       HIV: Non Reactive (03/11 1449)   Flu Shot     2019                           GBS:   Contraception  Pap:2018 NIL  CBB     CS/VBAC    Baby Food    Support Person              Consider ordering growth scan next visit if fundal height continues to be  greater than dates.  Preterm labor symptoms and general obstetric precautions including but not limited to vaginal bleeding, contractions, leaking of fluid and fetal movement were reviewed. Please refer to After Visit Summary for other counseling recommendations.   Return in about 2 weeks (around 11/11/2018) for ROB.  Avel Sensor, CNM 10/28/2018  9:21 AM

## 2018-10-28 NOTE — Patient Instructions (Signed)
Third Trimester of Pregnancy The third trimester is from week 28 through week 40 (months 7 through 9). The third trimester is a time when the unborn baby (fetus) is growing rapidly. At the end of the ninth month, the fetus is about 20 inches in length and weighs 6-10 pounds. Body changes during your third trimester Your body will continue to go through many changes during pregnancy. The changes vary from woman to woman. During the third trimester:  Your weight will continue to increase. You can expect to gain 25-35 pounds (11-16 kg) by the end of the pregnancy.  You may begin to get stretch marks on your hips, abdomen, and breasts.  You may urinate more often because the fetus is moving lower into your pelvis and pressing on your bladder.  You may develop or continue to have heartburn. This is caused by increased hormones that slow down muscles in the digestive tract.  You may develop or continue to have constipation because increased hormones slow digestion and cause the muscles that push waste through your intestines to relax.  You may develop hemorrhoids. These are swollen veins (varicose veins) in the rectum that can itch or be painful.  You may develop swollen, bulging veins (varicose veins) in your legs.  You may have increased body aches in the pelvis, back, or thighs. This is due to weight gain and increased hormones that are relaxing your joints.  You may have changes in your hair. These can include thickening of your hair, rapid growth, and changes in texture. Some women also have hair loss during or after pregnancy, or hair that feels dry or thin. Your hair will most likely return to normal after your baby is born.  Your breasts will continue to grow and they will continue to become tender. A yellow fluid (colostrum) may leak from your breasts. This is the first milk you are producing for your baby.  Your belly button may stick out.  You may notice more swelling in your hands,  face, or ankles.  You may have increased tingling or numbness in your hands, arms, and legs. The skin on your belly may also feel numb.  You may feel short of breath because of your expanding uterus.  You may have more problems sleeping. This can be caused by the size of your belly, increased need to urinate, and an increase in your body's metabolism.  You may notice the fetus "dropping," or moving lower in your abdomen (lightening).  You may have increased vaginal discharge.  You may notice your joints feel loose and you may have pain around your pelvic bone. What to expect at prenatal visits You will have prenatal exams every 2 weeks until week 36. Then you will have weekly prenatal exams. During a routine prenatal visit:  You will be weighed to make sure you and the baby are growing normally.  Your blood pressure will be taken.  Your abdomen will be measured to track your baby's growth.  The fetal heartbeat will be listened to.  Any test results from the previous visit will be discussed.  You may have a cervical check near your due date to see if your cervix has softened or thinned (effaced).  You will be tested for Group B streptococcus. This happens between 35 and 37 weeks. Your health care provider may ask you:  What your birth plan is.  How you are feeling.  If you are feeling the baby move.  If you have had any abnormal   symptoms, such as leaking fluid, bleeding, severe headaches, or abdominal cramping.  If you are using any tobacco products, including cigarettes, chewing tobacco, and electronic cigarettes.  If you have any questions. Other tests or screenings that may be performed during your third trimester include:  Blood tests that check for low iron levels (anemia).  Fetal testing to check the health, activity level, and growth of the fetus. Testing is done if you have certain medical conditions or if there are problems during the pregnancy.  Nonstress test  (NST). This test checks the health of your baby to make sure there are no signs of problems, such as the baby not getting enough oxygen. During this test, a belt is placed around your belly. The baby is made to move, and its heart rate is monitored during movement. What is false labor? False labor is a condition in which you feel small, irregular tightenings of the muscles in the womb (contractions) that usually go away with rest, changing position, or drinking water. These are called Braxton Hicks contractions. Contractions may last for hours, days, or even weeks before true labor sets in. If contractions come at regular intervals, become more frequent, increase in intensity, or become painful, you should see your health care provider. What are the signs of labor?  Abdominal cramps.  Regular contractions that start at 10 minutes apart and become stronger and more frequent with time.  Contractions that start on the top of the uterus and spread down to the lower abdomen and back.  Increased pelvic pressure and dull back pain.  A watery or bloody mucus discharge that comes from the vagina.  Leaking of amniotic fluid. This is also known as your "water breaking." It could be a slow trickle or a gush. Let your health care provider know if it has a color or strange odor. If you have any of these signs, call your health care provider right away, even if it is before your due date. Follow these instructions at home: Medicines  Follow your health care provider's instructions regarding medicine use. Specific medicines may be either safe or unsafe to take during pregnancy.  Take a prenatal vitamin that contains at least 600 micrograms (mcg) of folic acid.  If you develop constipation, try taking a stool softener if your health care provider approves. Eating and drinking   Eat a balanced diet that includes fresh fruits and vegetables, whole grains, good sources of protein such as meat, eggs, or tofu,  and low-fat dairy. Your health care provider will help you determine the amount of weight gain that is right for you.  Avoid raw meat and uncooked cheese. These carry germs that can cause birth defects in the baby.  If you have low calcium intake from food, talk to your health care provider about whether you should take a daily calcium supplement.  Eat four or five small meals rather than three large meals a day.  Limit foods that are high in fat and processed sugars, such as fried and sweet foods.  To prevent constipation: ? Drink enough fluid to keep your urine clear or pale yellow. ? Eat foods that are high in fiber, such as fresh fruits and vegetables, whole grains, and beans. Activity  Exercise only as directed by your health care provider. Most women can continue their usual exercise routine during pregnancy. Try to exercise for 30 minutes at least 5 days a week. Stop exercising if you experience uterine contractions.  Avoid heavy lifting.  Do   not exercise in extreme heat or humidity, or at high altitudes.  Wear low-heel, comfortable shoes.  Practice good posture.  You may continue to have sex unless your health care provider tells you otherwise. Relieving pain and discomfort  Take frequent breaks and rest with your legs elevated if you have leg cramps or low back pain.  Take warm sitz baths to soothe any pain or discomfort caused by hemorrhoids. Use hemorrhoid cream if your health care provider approves.  Wear a good support bra to prevent discomfort from breast tenderness.  If you develop varicose veins: ? Wear support pantyhose or compression stockings as told by your healthcare provider. ? Elevate your feet for 15 minutes, 3-4 times a day. Prenatal care  Write down your questions. Take them to your prenatal visits.  Keep all your prenatal visits as told by your health care provider. This is important. Safety  Wear your seat belt at all times when driving.  Make  a list of emergency phone numbers, including numbers for family, friends, the hospital, and police and fire departments. General instructions  Avoid cat litter boxes and soil used by cats. These carry germs that can cause birth defects in the baby. If you have a cat, ask someone to clean the litter box for you.  Do not travel far distances unless it is absolutely necessary and only with the approval of your health care provider.  Do not use hot tubs, steam rooms, or saunas.  Do not drink alcohol.  Do not use any products that contain nicotine or tobacco, such as cigarettes and e-cigarettes. If you need help quitting, ask your health care provider.  Do not use any medicinal herbs or unprescribed drugs. These chemicals affect the formation and growth of the baby.  Do not douche or use tampons or scented sanitary pads.  Do not cross your legs for long periods of time.  To prepare for the arrival of your baby: ? Take prenatal classes to understand, practice, and ask questions about labor and delivery. ? Make a trial run to the hospital. ? Visit the hospital and tour the maternity area. ? Arrange for maternity or paternity leave through employers. ? Arrange for family and friends to take care of pets while you are in the hospital. ? Purchase a rear-facing car seat and make sure you know how to install it in your car. ? Pack your hospital bag. ? Prepare the baby's nursery. Make sure to remove all pillows and stuffed animals from the baby's crib to prevent suffocation.  Visit your dentist if you have not gone during your pregnancy. Use a soft toothbrush to brush your teeth and be gentle when you floss. Contact a health care provider if:  You are unsure if you are in labor or if your water has broken.  You become dizzy.  You have mild pelvic cramps, pelvic pressure, or nagging pain in your abdominal area.  You have lower back pain.  You have persistent nausea, vomiting, or diarrhea.   You have an unusual or bad smelling vaginal discharge.  You have pain when you urinate. Get help right away if:  Your water breaks before 37 weeks.  You have regular contractions less than 5 minutes apart before 37 weeks.  You have a fever.  You are leaking fluid from your vagina.  You have spotting or bleeding from your vagina.  You have severe abdominal pain or cramping.  You have rapid weight loss or weight gain.  You have  shortness of breath with chest pain.  You notice sudden or extreme swelling of your face, hands, ankles, feet, or legs.  Your baby makes fewer than 10 movements in 2 hours.  You have severe headaches that do not go away when you take medicine.  You have vision changes. Summary  The third trimester is from week 28 through week 40, months 7 through 9. The third trimester is a time when the unborn baby (fetus) is growing rapidly.  During the third trimester, your discomfort may increase as you and your baby continue to gain weight. You may have abdominal, leg, and back pain, sleeping problems, and an increased need to urinate.  During the third trimester your breasts will keep growing and they will continue to become tender. A yellow fluid (colostrum) may leak from your breasts. This is the first milk you are producing for your baby.  False labor is a condition in which you feel small, irregular tightenings of the muscles in the womb (contractions) that eventually go away. These are called Braxton Hicks contractions. Contractions may last for hours, days, or even weeks before true labor sets in.  Signs of labor can include: abdominal cramps; regular contractions that start at 10 minutes apart and become stronger and more frequent with time; watery or bloody mucus discharge that comes from the vagina; increased pelvic pressure and dull back pain; and leaking of amniotic fluid. This information is not intended to replace advice given to you by your health  care provider. Make sure you discuss any questions you have with your health care provider. Document Released: 03/24/2001 Document Revised: 07/21/2018 Document Reviewed: 05/05/2016 Elsevier Patient Education  2020 [employer].

## 2018-10-29 LAB — 28 WEEKS RH-PANEL
Antibody Screen: NEGATIVE
Basophils Absolute: 0 10*3/uL (ref 0.0–0.2)
Basos: 0 %
EOS (ABSOLUTE): 0.3 10*3/uL (ref 0.0–0.4)
Eos: 3 %
Gestational Diabetes Screen: 99 mg/dL (ref 65–139)
HIV Screen 4th Generation wRfx: NONREACTIVE
Hematocrit: 33.7 % — ABNORMAL LOW (ref 34.0–46.6)
Hemoglobin: 11.1 g/dL (ref 11.1–15.9)
Immature Grans (Abs): 0.1 10*3/uL (ref 0.0–0.1)
Immature Granulocytes: 1 %
Lymphocytes Absolute: 2.3 10*3/uL (ref 0.7–3.1)
Lymphs: 18 %
MCH: 29.3 pg (ref 26.6–33.0)
MCHC: 32.9 g/dL (ref 31.5–35.7)
MCV: 89 fL (ref 79–97)
Monocytes Absolute: 0.5 10*3/uL (ref 0.1–0.9)
Monocytes: 4 %
Neutrophils Absolute: 9.4 10*3/uL — ABNORMAL HIGH (ref 1.4–7.0)
Neutrophils: 74 %
Platelets: 395 10*3/uL (ref 150–450)
RBC: 3.79 x10E6/uL (ref 3.77–5.28)
RDW: 12.3 % (ref 11.7–15.4)
RPR Ser Ql: NONREACTIVE
WBC: 12.7 10*3/uL — ABNORMAL HIGH (ref 3.4–10.8)

## 2018-10-31 ENCOUNTER — Telehealth: Payer: Self-pay

## 2018-10-31 NOTE — Telephone Encounter (Signed)
FMLA/DISABILITY form for [employer] filled out, signature obtained and given to KT for processing.

## 2018-11-10 ENCOUNTER — Ambulatory Visit: Payer: BLUE CROSS/BLUE SHIELD | Admitting: Family Medicine

## 2018-11-11 ENCOUNTER — Ambulatory Visit (INDEPENDENT_AMBULATORY_CARE_PROVIDER_SITE_OTHER): Payer: BC Managed Care – PPO | Admitting: Advanced Practice Midwife

## 2018-11-11 ENCOUNTER — Other Ambulatory Visit: Payer: Self-pay

## 2018-11-11 ENCOUNTER — Encounter: Payer: Self-pay | Admitting: Advanced Practice Midwife

## 2018-11-11 VITALS — BP 118/78 | Wt 226.0 lb

## 2018-11-11 DIAGNOSIS — Z3483 Encounter for supervision of other normal pregnancy, third trimester: Secondary | ICD-10-CM

## 2018-11-11 DIAGNOSIS — Z3480 Encounter for supervision of other normal pregnancy, unspecified trimester: Secondary | ICD-10-CM

## 2018-11-11 DIAGNOSIS — Z3A28 28 weeks gestation of pregnancy: Secondary | ICD-10-CM

## 2018-11-11 NOTE — Progress Notes (Signed)
No vb. No lof.  

## 2018-11-11 NOTE — Progress Notes (Signed)
Routine Prenatal Care Visit  Subjective  Jacqueline Fields is a 28 y.o. G3P2002 at [redacted]w[redacted]d being seen today for ongoing prenatal care.  She is currently monitored for the following issues for this low-risk pregnancy and has Tremors of nervous system; Anemia; Allergy; Heart palpitations; Allergic asthma, mild intermittent, with acute exacerbation; Routine general medical examination at a health care facility; Irregular menses; Menstrual cramps; Cholecystitis; RUQ abdominal pain; Occasional tremors; Stuttering; Oligomenorrhea, unspecified; CAP (community acquired pneumonia); Iron deficiency anemia; Supervision of other normal pregnancy, antepartum; and Rh negative state in antepartum period on their problem list.  ----------------------------------------------------------------------------------- Patient reports no complaints.  Patient received rhogam at last visit. Contractions: Not present. Vag. Bleeding: None.  Movement: Present. Denies leaking of fluid.  ----------------------------------------------------------------------------------- The following portions of the patient's history were reviewed and updated as appropriate: allergies, current medications, past family history, past medical history, past social history, past surgical history and problem list. Problem list updated.   Objective  Blood pressure 118/78, weight 226 lb (102.5 kg), last menstrual period 04/24/2018. Pregravid weight 227 lb (103 kg) Total Weight Gain -1 lb (-0.454 kg) Urinalysis: Urine Protein    Urine Glucose    Fetal Status: Fetal Heart Rate (bpm): 145 Fundal Height: 29 cm Movement: Present     General:  Alert, oriented and cooperative. Patient is in no acute distress.  Skin: Skin is warm and dry. No rash noted.   Cardiovascular: Normal heart rate noted  Respiratory: Normal respiratory effort, no problems with respiration noted  Abdomen: Soft, gravid, appropriate for gestational age. Pain/Pressure: Absent     Pelvic:   Cervical exam deferred        Extremities: Normal range of motion.     Mental Status: Normal mood and affect. Normal behavior. Normal judgment and thought content.   Assessment   28 y.o. G3P2002 at [redacted]w[redacted]d by  01/29/2019, by Last Menstrual Period presenting for routine prenatal visit  Plan   Preganacy #3 Problems (from 04/24/18 to present)    Problem Noted Resolved   Rh negative state in antepartum period 08/08/2018 by Will Bonnet, MD No   Overview Signed 08/08/2018 10:39 AM by Will Bonnet, MD    [ ]  rhogam 28 weeks [ ]  rhogam pp, prn (new FOB)      Supervision of other normal pregnancy, antepartum 07/11/2018 by Homero Fellers, MD No   Overview Signed 07/11/2018  4:14 PM by Homero Fellers, MD      [facility] Prenatal Labs  Dating  LMP=10wk [place] Blood type: A/Negative/-- (03/11 1449)   Genetic Screen NIPS:   pending Antibody:Negative (03/11 1449)  Anatomic [place]  Rubella: 3.52 (03/11 1449)  Varicella: Immune  GTT Early:        28 wk:      RPR: Non Reactive (03/11 1449)   Rhogam 10/28/18, 26 weeks HBsAg: Negative (03/11 1449)   TDaP vaccine                       HIV: Non Reactive (03/11 1449)   Flu Shot     2019                           GBS:   Contraception  Pap:2018 NIL  CBB     CS/VBAC    Baby Food    Support Person                 Preterm labor  symptoms and general obstetric precautions including but not limited to vaginal bleeding, contractions, leaking of fluid and fetal movement were reviewed in detail with the patient. Please refer to After Visit Summary for other counseling recommendations.   Return in about 2 weeks (around 11/25/2018) for rob.  Rod Can, CNM 11/11/2018 8:30 AM

## 2018-11-11 NOTE — Patient Instructions (Signed)
Third Trimester of Pregnancy The third trimester is from week 28 through week 40 (months 7 through 9). The third trimester is a time when the unborn baby (fetus) is growing rapidly. At the end of the ninth month, the fetus is about 20 inches in length and weighs 6-10 pounds. Body changes during your third trimester Your body will continue to go through many changes during pregnancy. The changes vary from woman to woman. During the third trimester:  Your weight will continue to increase. You can expect to gain 25-35 pounds (11-16 kg) by the end of the pregnancy.  You may begin to get stretch marks on your hips, abdomen, and breasts.  You may urinate more often because the fetus is moving lower into your pelvis and pressing on your bladder.  You may develop or continue to have heartburn. This is caused by increased hormones that slow down muscles in the digestive tract.  You may develop or continue to have constipation because increased hormones slow digestion and cause the muscles that push waste through your intestines to relax.  You may develop hemorrhoids. These are swollen veins (varicose veins) in the rectum that can itch or be painful.  You may develop swollen, bulging veins (varicose veins) in your legs.  You may have increased body aches in the pelvis, back, or thighs. This is due to weight gain and increased hormones that are relaxing your joints.  You may have changes in your hair. These can include thickening of your hair, rapid growth, and changes in texture. Some women also have hair loss during or after pregnancy, or hair that feels dry or thin. Your hair will most likely return to normal after your baby is born.  Your breasts will continue to grow and they will continue to become tender. A yellow fluid (colostrum) may leak from your breasts. This is the first milk you are producing for your baby.  Your belly button may stick out.  You may notice more swelling in your hands,  face, or ankles.  You may have increased tingling or numbness in your hands, arms, and legs. The skin on your belly may also feel numb.  You may feel short of breath because of your expanding uterus.  You may have more problems sleeping. This can be caused by the size of your belly, increased need to urinate, and an increase in your body's metabolism.  You may notice the fetus "dropping," or moving lower in your abdomen (lightening).  You may have increased vaginal discharge.  You may notice your joints feel loose and you may have pain around your pelvic bone. What to expect at prenatal visits You will have prenatal exams every 2 weeks until week 36. Then you will have weekly prenatal exams. During a routine prenatal visit:  You will be weighed to make sure you and the baby are growing normally.  Your blood pressure will be taken.  Your abdomen will be measured to track your baby's growth.  The fetal heartbeat will be listened to.  Any test results from the previous visit will be discussed.  You may have a cervical check near your due date to see if your cervix has softened or thinned (effaced).  You will be tested for Group B streptococcus. This happens between 35 and 37 weeks. Your health care provider may ask you:  What your birth plan is.  How you are feeling.  If you are feeling the baby move.  If you have had any abnormal   symptoms, such as leaking fluid, bleeding, severe headaches, or abdominal cramping.  If you are using any tobacco products, including cigarettes, chewing tobacco, and electronic cigarettes.  If you have any questions. Other tests or screenings that may be performed during your third trimester include:  Blood tests that check for low iron levels (anemia).  Fetal testing to check the health, activity level, and growth of the fetus. Testing is done if you have certain medical conditions or if there are problems during the pregnancy.  Nonstress test  (NST). This test checks the health of your baby to make sure there are no signs of problems, such as the baby not getting enough oxygen. During this test, a belt is placed around your belly. The baby is made to move, and its heart rate is monitored during movement. What is false labor? False labor is a condition in which you feel small, irregular tightenings of the muscles in the womb (contractions) that usually go away with rest, changing position, or drinking water. These are called Braxton Hicks contractions. Contractions may last for hours, days, or even weeks before true labor sets in. If contractions come at regular intervals, become more frequent, increase in intensity, or become painful, you should see your health care provider. What are the signs of labor?  Abdominal cramps.  Regular contractions that start at 10 minutes apart and become stronger and more frequent with time.  Contractions that start on the top of the uterus and spread down to the lower abdomen and back.  Increased pelvic pressure and dull back pain.  A watery or bloody mucus discharge that comes from the vagina.  Leaking of amniotic fluid. This is also known as your "water breaking." It could be a slow trickle or a gush. Let your health care provider know if it has a color or strange odor. If you have any of these signs, call your health care provider right away, even if it is before your due date. Follow these instructions at home: Medicines  Follow your health care provider's instructions regarding medicine use. Specific medicines may be either safe or unsafe to take during pregnancy.  Take a prenatal vitamin that contains at least 600 micrograms (mcg) of folic acid.  If you develop constipation, try taking a stool softener if your health care provider approves. Eating and drinking   Eat a balanced diet that includes fresh fruits and vegetables, whole grains, good sources of protein such as meat, eggs, or tofu,  and low-fat dairy. Your health care provider will help you determine the amount of weight gain that is right for you.  Avoid raw meat and uncooked cheese. These carry germs that can cause birth defects in the baby.  If you have low calcium intake from food, talk to your health care provider about whether you should take a daily calcium supplement.  Eat four or five small meals rather than three large meals a day.  Limit foods that are high in fat and processed sugars, such as fried and sweet foods.  To prevent constipation: ? Drink enough fluid to keep your urine clear or pale yellow. ? Eat foods that are high in fiber, such as fresh fruits and vegetables, whole grains, and beans. Activity  Exercise only as directed by your health care provider. Most women can continue their usual exercise routine during pregnancy. Try to exercise for 30 minutes at least 5 days a week. Stop exercising if you experience uterine contractions.  Avoid heavy lifting.  Do   not exercise in extreme heat or humidity, or at high altitudes.  Wear low-heel, comfortable shoes.  Practice good posture.  You may continue to have sex unless your health care provider tells you otherwise. Relieving pain and discomfort  Take frequent breaks and rest with your legs elevated if you have leg cramps or low back pain.  Take warm sitz baths to soothe any pain or discomfort caused by hemorrhoids. Use hemorrhoid cream if your health care provider approves.  Wear a good support bra to prevent discomfort from breast tenderness.  If you develop varicose veins: ? Wear support pantyhose or compression stockings as told by your healthcare provider. ? Elevate your feet for 15 minutes, 3-4 times a day. Prenatal care  Write down your questions. Take them to your prenatal visits.  Keep all your prenatal visits as told by your health care provider. This is important. Safety  Wear your seat belt at all times when driving.  Make  a list of emergency phone numbers, including numbers for family, friends, the hospital, and police and fire departments. General instructions  Avoid cat litter boxes and soil used by cats. These carry germs that can cause birth defects in the baby. If you have a cat, ask someone to clean the litter box for you.  Do not travel far distances unless it is absolutely necessary and only with the approval of your health care provider.  Do not use hot tubs, steam rooms, or saunas.  Do not drink alcohol.  Do not use any products that contain nicotine or tobacco, such as cigarettes and e-cigarettes. If you need help quitting, ask your health care provider.  Do not use any medicinal herbs or unprescribed drugs. These chemicals affect the formation and growth of the baby.  Do not douche or use tampons or scented sanitary pads.  Do not cross your legs for long periods of time.  To prepare for the arrival of your baby: ? Take prenatal classes to understand, practice, and ask questions about labor and delivery. ? Make a trial run to the hospital. ? Visit the hospital and tour the maternity area. ? Arrange for maternity or paternity leave through employers. ? Arrange for family and friends to take care of pets while you are in the hospital. ? Purchase a rear-facing car seat and make sure you know how to install it in your car. ? Pack your hospital bag. ? Prepare the baby's nursery. Make sure to remove all pillows and stuffed animals from the baby's crib to prevent suffocation.  Visit your dentist if you have not gone during your pregnancy. Use a soft toothbrush to brush your teeth and be gentle when you floss. Contact a health care provider if:  You are unsure if you are in labor or if your water has broken.  You become dizzy.  You have mild pelvic cramps, pelvic pressure, or nagging pain in your abdominal area.  You have lower back pain.  You have persistent nausea, vomiting, or diarrhea.   You have an unusual or bad smelling vaginal discharge.  You have pain when you urinate. Get help right away if:  Your water breaks before 37 weeks.  You have regular contractions less than 5 minutes apart before 37 weeks.  You have a fever.  You are leaking fluid from your vagina.  You have spotting or bleeding from your vagina.  You have severe abdominal pain or cramping.  You have rapid weight loss or weight gain.  You have  shortness of breath with chest pain.  You notice sudden or extreme swelling of your face, hands, ankles, feet, or legs.  Your baby makes fewer than 10 movements in 2 hours.  You have severe headaches that do not go away when you take medicine.  You have vision changes. Summary  The third trimester is from week 28 through week 40, months 7 through 9. The third trimester is a time when the unborn baby (fetus) is growing rapidly.  During the third trimester, your discomfort may increase as you and your baby continue to gain weight. You may have abdominal, leg, and back pain, sleeping problems, and an increased need to urinate.  During the third trimester your breasts will keep growing and they will continue to become tender. A yellow fluid (colostrum) may leak from your breasts. This is the first milk you are producing for your baby.  False labor is a condition in which you feel small, irregular tightenings of the muscles in the womb (contractions) that eventually go away. These are called Braxton Hicks contractions. Contractions may last for hours, days, or even weeks before true labor sets in.  Signs of labor can include: abdominal cramps; regular contractions that start at 10 minutes apart and become stronger and more frequent with time; watery or bloody mucus discharge that comes from the vagina; increased pelvic pressure and dull back pain; and leaking of amniotic fluid. This information is not intended to replace advice given to you by your health  care provider. Make sure you discuss any questions you have with your health care provider. Document Released: 03/24/2001 Document Revised: 07/21/2018 Document Reviewed: 05/05/2016 Elsevier Patient Education  2020 [employer].

## 2018-11-25 ENCOUNTER — Ambulatory Visit (INDEPENDENT_AMBULATORY_CARE_PROVIDER_SITE_OTHER): Payer: BC Managed Care – PPO | Admitting: Maternal Newborn

## 2018-11-25 ENCOUNTER — Encounter: Payer: Self-pay | Admitting: Maternal Newborn

## 2018-11-25 ENCOUNTER — Other Ambulatory Visit: Payer: Self-pay

## 2018-11-25 VITALS — BP 120/70 | Wt 228.0 lb

## 2018-11-25 DIAGNOSIS — Z3480 Encounter for supervision of other normal pregnancy, unspecified trimester: Secondary | ICD-10-CM

## 2018-11-25 DIAGNOSIS — O26893 Other specified pregnancy related conditions, third trimester: Secondary | ICD-10-CM

## 2018-11-25 DIAGNOSIS — Z3A30 30 weeks gestation of pregnancy: Secondary | ICD-10-CM

## 2018-11-25 DIAGNOSIS — Z3689 Encounter for other specified antenatal screening: Secondary | ICD-10-CM

## 2018-11-25 DIAGNOSIS — Z6791 Unspecified blood type, Rh negative: Secondary | ICD-10-CM

## 2018-11-25 LAB — POCT URINALYSIS DIPSTICK OB
Glucose, UA: NEGATIVE
POC,PROTEIN,UA: NEGATIVE

## 2018-11-25 NOTE — Patient Instructions (Signed)
Third Trimester of Pregnancy The third trimester is from week 28 through week 40 (months 7 through 9). The third trimester is a time when the unborn baby (fetus) is growing rapidly. At the end of the ninth month, the fetus is about 20 inches in length and weighs 6-10 pounds. Body changes during your third trimester Your body will continue to go through many changes during pregnancy. The changes vary from woman to woman. During the third trimester:  Your weight will continue to increase. You can expect to gain 25-35 pounds (11-16 kg) by the end of the pregnancy.  You may begin to get stretch marks on your hips, abdomen, and breasts.  You may urinate more often because the fetus is moving lower into your pelvis and pressing on your bladder.  You may develop or continue to have heartburn. This is caused by increased hormones that slow down muscles in the digestive tract.  You may develop or continue to have constipation because increased hormones slow digestion and cause the muscles that push waste through your intestines to relax.  You may develop hemorrhoids. These are swollen veins (varicose veins) in the rectum that can itch or be painful.  You may develop swollen, bulging veins (varicose veins) in your legs.  You may have increased body aches in the pelvis, back, or thighs. This is due to weight gain and increased hormones that are relaxing your joints.  You may have changes in your hair. These can include thickening of your hair, rapid growth, and changes in texture. Some women also have hair loss during or after pregnancy, or hair that feels dry or thin. Your hair will most likely return to normal after your baby is born.  Your breasts will continue to grow and they will continue to become tender. A yellow fluid (colostrum) may leak from your breasts. This is the first milk you are producing for your baby.  Your belly button may stick out.  You may notice more swelling in your hands,  face, or ankles.  You may have increased tingling or numbness in your hands, arms, and legs. The skin on your belly may also feel numb.  You may feel short of breath because of your expanding uterus.  You may have more problems sleeping. This can be caused by the size of your belly, increased need to urinate, and an increase in your body's metabolism.  You may notice the fetus "dropping," or moving lower in your abdomen (lightening).  You may have increased vaginal discharge.  You may notice your joints feel loose and you may have pain around your pelvic bone. What to expect at prenatal visits You will have prenatal exams every 2 weeks until week 36. Then you will have weekly prenatal exams. During a routine prenatal visit:  You will be weighed to make sure you and the baby are growing normally.  Your blood pressure will be taken.  Your abdomen will be measured to track your baby's growth.  The fetal heartbeat will be listened to.  Any test results from the previous visit will be discussed.  You may have a cervical check near your due date to see if your cervix has softened or thinned (effaced).  You will be tested for Group B streptococcus. This happens between 35 and 37 weeks. Your health care provider may ask you:  What your birth plan is.  How you are feeling.  If you are feeling the baby move.  If you have had any abnormal   symptoms, such as leaking fluid, bleeding, severe headaches, or abdominal cramping.  If you are using any tobacco products, including cigarettes, chewing tobacco, and electronic cigarettes.  If you have any questions. Other tests or screenings that may be performed during your third trimester include:  Blood tests that check for low iron levels (anemia).  Fetal testing to check the health, activity level, and growth of the fetus. Testing is done if you have certain medical conditions or if there are problems during the pregnancy.  Nonstress test  (NST). This test checks the health of your baby to make sure there are no signs of problems, such as the baby not getting enough oxygen. During this test, a belt is placed around your belly. The baby is made to move, and its heart rate is monitored during movement. What is false labor? False labor is a condition in which you feel small, irregular tightenings of the muscles in the womb (contractions) that usually go away with rest, changing position, or drinking water. These are called Braxton Hicks contractions. Contractions may last for hours, days, or even weeks before true labor sets in. If contractions come at regular intervals, become more frequent, increase in intensity, or become painful, you should see your health care provider. What are the signs of labor?  Abdominal cramps.  Regular contractions that start at 10 minutes apart and become stronger and more frequent with time.  Contractions that start on the top of the uterus and spread down to the lower abdomen and back.  Increased pelvic pressure and dull back pain.  A watery or bloody mucus discharge that comes from the vagina.  Leaking of amniotic fluid. This is also known as your "water breaking." It could be a slow trickle or a gush. Let your health care provider know if it has a color or strange odor. If you have any of these signs, call your health care provider right away, even if it is before your due date. Follow these instructions at home: Medicines  Follow your health care provider's instructions regarding medicine use. Specific medicines may be either safe or unsafe to take during pregnancy.  Take a prenatal vitamin that contains at least 600 micrograms (mcg) of folic acid.  If you develop constipation, try taking a stool softener if your health care provider approves. Eating and drinking   Eat a balanced diet that includes fresh fruits and vegetables, whole grains, good sources of protein such as meat, eggs, or tofu,  and low-fat dairy. Your health care provider will help you determine the amount of weight gain that is right for you.  Avoid raw meat and uncooked cheese. These carry germs that can cause birth defects in the baby.  If you have low calcium intake from food, talk to your health care provider about whether you should take a daily calcium supplement.  Eat four or five small meals rather than three large meals a day.  Limit foods that are high in fat and processed sugars, such as fried and sweet foods.  To prevent constipation: ? Drink enough fluid to keep your urine clear or pale yellow. ? Eat foods that are high in fiber, such as fresh fruits and vegetables, whole grains, and beans. Activity  Exercise only as directed by your health care provider. Most women can continue their usual exercise routine during pregnancy. Try to exercise for 30 minutes at least 5 days a week. Stop exercising if you experience uterine contractions.  Avoid heavy lifting.  Do   not exercise in extreme heat or humidity, or at high altitudes.  Wear low-heel, comfortable shoes.  Practice good posture.  You may continue to have sex unless your health care provider tells you otherwise. Relieving pain and discomfort  Take frequent breaks and rest with your legs elevated if you have leg cramps or low back pain.  Take warm sitz baths to soothe any pain or discomfort caused by hemorrhoids. Use hemorrhoid cream if your health care provider approves.  Wear a good support bra to prevent discomfort from breast tenderness.  If you develop varicose veins: ? Wear support pantyhose or compression stockings as told by your healthcare provider. ? Elevate your feet for 15 minutes, 3-4 times a day. Prenatal care  Write down your questions. Take them to your prenatal visits.  Keep all your prenatal visits as told by your health care provider. This is important. Safety  Wear your seat belt at all times when driving.  Make  a list of emergency phone numbers, including numbers for family, friends, the hospital, and police and fire departments. General instructions  Avoid cat litter boxes and soil used by cats. These carry germs that can cause birth defects in the baby. If you have a cat, ask someone to clean the litter box for you.  Do not travel far distances unless it is absolutely necessary and only with the approval of your health care provider.  Do not use hot tubs, steam rooms, or saunas.  Do not drink alcohol.  Do not use any products that contain nicotine or tobacco, such as cigarettes and e-cigarettes. If you need help quitting, ask your health care provider.  Do not use any medicinal herbs or unprescribed drugs. These chemicals affect the formation and growth of the baby.  Do not douche or use tampons or scented sanitary pads.  Do not cross your legs for long periods of time.  To prepare for the arrival of your baby: ? Take prenatal classes to understand, practice, and ask questions about labor and delivery. ? Make a trial run to the hospital. ? Visit the hospital and tour the maternity area. ? Arrange for maternity or paternity leave through employers. ? Arrange for family and friends to take care of pets while you are in the hospital. ? Purchase a rear-facing car seat and make sure you know how to install it in your car. ? Pack your hospital bag. ? Prepare the baby's nursery. Make sure to remove all pillows and stuffed animals from the baby's crib to prevent suffocation.  Visit your dentist if you have not gone during your pregnancy. Use a soft toothbrush to brush your teeth and be gentle when you floss. Contact a health care provider if:  You are unsure if you are in labor or if your water has broken.  You become dizzy.  You have mild pelvic cramps, pelvic pressure, or nagging pain in your abdominal area.  You have lower back pain.  You have persistent nausea, vomiting, or diarrhea.   You have an unusual or bad smelling vaginal discharge.  You have pain when you urinate. Get help right away if:  Your water breaks before 37 weeks.  You have regular contractions less than 5 minutes apart before 37 weeks.  You have a fever.  You are leaking fluid from your vagina.  You have spotting or bleeding from your vagina.  You have severe abdominal pain or cramping.  You have rapid weight loss or weight gain.  You have  shortness of breath with chest pain.  You notice sudden or extreme swelling of your face, hands, ankles, feet, or legs.  Your baby makes fewer than 10 movements in 2 hours.  You have severe headaches that do not go away when you take medicine.  You have vision changes. Summary  The third trimester is from week 28 through week 40, months 7 through 9. The third trimester is a time when the unborn baby (fetus) is growing rapidly.  During the third trimester, your discomfort may increase as you and your baby continue to gain weight. You may have abdominal, leg, and back pain, sleeping problems, and an increased need to urinate.  During the third trimester your breasts will keep growing and they will continue to become tender. A yellow fluid (colostrum) may leak from your breasts. This is the first milk you are producing for your baby.  False labor is a condition in which you feel small, irregular tightenings of the muscles in the womb (contractions) that eventually go away. These are called Braxton Hicks contractions. Contractions may last for hours, days, or even weeks before true labor sets in.  Signs of labor can include: abdominal cramps; regular contractions that start at 10 minutes apart and become stronger and more frequent with time; watery or bloody mucus discharge that comes from the vagina; increased pelvic pressure and dull back pain; and leaking of amniotic fluid. This information is not intended to replace advice given to you by your health  care provider. Make sure you discuss any questions you have with your health care provider. Document Released: 03/24/2001 Document Revised: 07/21/2018 Document Reviewed: 05/05/2016 Elsevier Patient Education  2020 [employer].

## 2018-11-25 NOTE — Progress Notes (Addendum)
Routine Prenatal Care Visit  Subjective  Jacqueline Fields is a 28 y.o. G3P2002 at [redacted]w[redacted]d being seen today for ongoing prenatal care.  She is currently monitored for the following issues for this low-risk pregnancy and has Tremors of nervous system; Anemia; Allergy; Heart palpitations; Allergic asthma, mild intermittent, with acute exacerbation; Irregular menses; Menstrual cramps; Cholecystitis; RUQ abdominal pain; Occasional tremors; Stuttering; Oligomenorrhea, unspecified; CAP (community acquired pneumonia); Iron deficiency anemia; Supervision of other normal pregnancy, antepartum; and Rh negative state in antepartum period on their problem list.  ----------------------------------------------------------------------------------- Patient reports that baby has moved lower and she is having more pressure as well as some occasional pain in her upper abdomen in the lateral muscles. Contractions: Not present. Vag. Bleeding: None.  Movement: Present. No leaking of fluid.  ----------------------------------------------------------------------------------- The following portions of the patient's history were reviewed and updated as appropriate: allergies, current medications, past family history, past medical history, past social history, past surgical history and problem list. Problem list updated.   Objective  Blood pressure 120/70, weight 228 lb (103.4 kg), last menstrual period 04/24/2018. Pregravid weight 227 lb (103 kg) Total Weight Gain 1 lb (0.454 kg) Urinalysis: Urine dipstick shows negative for glucose, protein.  Fetal Status: Fetal Heart Rate (bpm): 141 Fundal Height: 34 cm Movement: Present     General:  Alert, oriented and cooperative. Patient is in no acute distress.  Skin: Skin is warm and dry. No rash noted.   Cardiovascular: Normal heart rate noted  Respiratory: Normal respiratory effort, no problems with respiration noted  Abdomen: Soft, gravid, appropriate for gestational age.  Pain/Pressure: Present     Pelvic:  Cervical exam deferred        Extremities: Normal range of motion.  Edema: None  Mental Status: Normal mood and affect. Normal behavior. Normal judgment and thought content.     Assessment   28 y.o. [id number] at [redacted]w[redacted]d, EDD 01/29/2019 by Last Menstrual Period presenting for a routine prenatal visit.  Plan   Preganacy #3 Problems (from 04/24/18 to present)    Problem Noted Resolved   Rh negative state in antepartum period 08/08/2018 by Will Bonnet, MD No   Overview Signed 08/08/2018 10:39 AM by Will Bonnet, MD    [ ]  rhogam 28 weeks [ ]  rhogam pp, prn (new FOB)      Supervision of other normal pregnancy, antepartum 07/11/2018 by Homero Fellers, MD No   Overview Addendum 11/11/2018  8:35 AM by Rod Can, [place] Prenatal Labs  Dating  LMP=10wk [place] Blood type: A/Negative/-- (03/11 1449)   Genetic Screen NIPS:   pending Antibody:Negative (03/11 1449)  Anatomic [place]  Rubella: 3.52 (03/11 1449)  Varicella: Immune  GTT Early:        28 wk:      RPR: Non Reactive (03/11 1449)   Rhogam 10/28/18, 26 weeks HBsAg: Negative (03/11 1449)   TDaP vaccine                       HIV: Non Reactive (03/11 1449)   Flu Shot     2019                           GBS:   Contraception  Pap:2018 NIL  CBB     CS/VBAC    Baby Food    Support Person  Growth scan next time for fundal height measuring greater than dates.  Has had TDaP December 2019.  Preterm labor symptoms and general obstetric precautions including but not limited to vaginal bleeding, contractions, leaking of fluid and fetal movement were reviewed.  Please refer to After Visit Summary for other counseling recommendations.   Return in about 2 weeks (around 12/09/2018) for ROB and growth scan.  Avel Sensor, CNM 11/25/2018  9:43 AM

## 2018-11-25 NOTE — Addendum Note (Signed)
Addended by: Drenda Freeze on: 11/25/2018 10:00 AM   Modules accepted: Orders

## 2018-11-25 NOTE — Progress Notes (Signed)
ROB and BT consent today- top abdominal pain

## 2018-12-09 ENCOUNTER — Ambulatory Visit (INDEPENDENT_AMBULATORY_CARE_PROVIDER_SITE_OTHER): Payer: BC Managed Care – PPO

## 2018-12-09 ENCOUNTER — Ambulatory Visit (INDEPENDENT_AMBULATORY_CARE_PROVIDER_SITE_OTHER): Payer: BC Managed Care – PPO | Admitting: Certified Nurse Midwife

## 2018-12-09 ENCOUNTER — Other Ambulatory Visit: Payer: Self-pay

## 2018-12-09 VITALS — BP 110/60 | Wt 230.6 lb

## 2018-12-09 DIAGNOSIS — Z3480 Encounter for supervision of other normal pregnancy, unspecified trimester: Secondary | ICD-10-CM

## 2018-12-09 DIAGNOSIS — Z362 Encounter for other antenatal screening follow-up: Secondary | ICD-10-CM | POA: Diagnosis not present

## 2018-12-09 DIAGNOSIS — Z3689 Encounter for other specified antenatal screening: Secondary | ICD-10-CM

## 2018-12-09 DIAGNOSIS — Z3A32 32 weeks gestation of pregnancy: Secondary | ICD-10-CM

## 2018-12-09 DIAGNOSIS — Z3483 Encounter for supervision of other normal pregnancy, third trimester: Secondary | ICD-10-CM

## 2018-12-09 LAB — POCT URINALYSIS DIPSTICK OB
Glucose, UA: NEGATIVE
POC,PROTEIN,UA: NEGATIVE

## 2018-12-09 NOTE — Progress Notes (Signed)
C/o ? B-H ctxs - was sent home from work c ctxs.rj

## 2018-12-10 NOTE — Progress Notes (Signed)
ROB at 32wk5d: Reports having one day last week when she had frequent but irregular contractions over a three hour period at work. Was sent home from work and after hydrating and resting, the contractions abated. No bleeding or leakage of fluid. Baby active. Growth scan today: CGA 32wk 2d, EFW 3#14oz (25.6%), AFI [id number], cephalic Breast/ Depo Declines TDAP-had last dose 03/21/2018 Preterm labor precautions [place] instructions RTO 2 weeks  Dalia Heading, CNM

## 2018-12-23 ENCOUNTER — Other Ambulatory Visit: Payer: Self-pay

## 2018-12-23 ENCOUNTER — Ambulatory Visit (INDEPENDENT_AMBULATORY_CARE_PROVIDER_SITE_OTHER): Payer: BC Managed Care – PPO | Admitting: Certified Nurse Midwife

## 2018-12-23 VITALS — BP 120/60 | Wt 232.0 lb

## 2018-12-23 DIAGNOSIS — Z3480 Encounter for supervision of other normal pregnancy, unspecified trimester: Secondary | ICD-10-CM

## 2018-12-23 DIAGNOSIS — Z3483 Encounter for supervision of other normal pregnancy, third trimester: Secondary | ICD-10-CM

## 2018-12-23 DIAGNOSIS — Z3A34 34 weeks gestation of pregnancy: Secondary | ICD-10-CM

## 2018-12-23 LAB — POCT URINALYSIS DIPSTICK OB
Glucose, UA: NEGATIVE
POC,PROTEIN,UA: NEGATIVE

## 2018-12-23 MED ORDER — FLUCONAZOLE 150 MG PO TABS: ORAL_TABLET | ORAL | 0 refills | Status: DC

## 2018-12-23 MED ORDER — AMOXICILLIN 250 MG/5ML PO SUSR: 500.0000 mg | Freq: Three times a day (TID) | ORAL | 0 refills | Status: AC

## 2018-12-23 NOTE — Progress Notes (Signed)
C/o water slowly leaking; look at tick bite. rj

## 2018-12-26 NOTE — Progress Notes (Addendum)
ROB at [place]. Good FM. Complains of a clear vaginal discharge/leaking. No vulvar itching or irritation. Had a tick bite in her left groin. Husband removed tick "in one piece." after it was there at least 24 hours. No vaginal bleeding.  Abdomen: FHTs WNL; in left groin there is a macular rash surrounding the tick bite. SSE performed: Vulva: no lesions Vagina: white discharge in vagina Negative Nitrazine, negative ferning, negative pooling. Wet prep: negative for hyphae, Trich and clue cells. A: NO evidence of PPROM S/p tick bite, now with rash in area  P:Amoxicillin 500 mgm tid x 10 days for Lime disease prevention Diflucan 150 mgm every 3 days x 2 doses.  ROB in 2 weeks for cultures Labor precautions  Dalia Heading, CNM

## 2019-01-06 ENCOUNTER — Other Ambulatory Visit: Payer: Self-pay

## 2019-01-06 ENCOUNTER — Ambulatory Visit (INDEPENDENT_AMBULATORY_CARE_PROVIDER_SITE_OTHER): Payer: BC Managed Care – PPO | Admitting: Obstetrics and Gynecology

## 2019-01-06 ENCOUNTER — Other Ambulatory Visit (HOSPITAL_COMMUNITY)
Admission: RE | Admit: 2019-01-06 | Discharge: 2019-01-06 | Disposition: A | Discharge status: Home / Self Care | Payer: BC Managed Care – PPO | Source: Ambulatory Visit | Attending: Obstetrics and Gynecology | Admitting: Obstetrics and Gynecology

## 2019-01-06 ENCOUNTER — Encounter: Payer: Self-pay | Admitting: Obstetrics and Gynecology

## 2019-01-06 VITALS — BP 110/80 | Wt 233.0 lb

## 2019-01-06 DIAGNOSIS — Z6791 Unspecified blood type, Rh negative: Secondary | ICD-10-CM | POA: Diagnosis not present

## 2019-01-06 DIAGNOSIS — Z3480 Encounter for supervision of other normal pregnancy, unspecified trimester: Secondary | ICD-10-CM

## 2019-01-06 DIAGNOSIS — O368130 Decreased fetal movements, third trimester, not applicable or unspecified: Secondary | ICD-10-CM | POA: Diagnosis not present

## 2019-01-06 DIAGNOSIS — Z23 Encounter for immunization: Secondary | ICD-10-CM

## 2019-01-06 DIAGNOSIS — Z3A36 36 weeks gestation of pregnancy: Secondary | ICD-10-CM | POA: Insufficient documentation

## 2019-01-06 DIAGNOSIS — O26893 Other specified pregnancy related conditions, third trimester: Secondary | ICD-10-CM

## 2019-01-06 DIAGNOSIS — O26899 Other specified pregnancy related conditions, unspecified trimester: Secondary | ICD-10-CM

## 2019-01-06 LAB — POCT URINALYSIS DIPSTICK OB
Glucose, UA: NEGATIVE
POC,PROTEIN,UA: NEGATIVE

## 2019-01-06 NOTE — Progress Notes (Signed)
Routine Prenatal Care Visit  Subjective  Jacqueline Fields is a 28 y.o. G3P2002 at [redacted]w[redacted]d being seen today for ongoing prenatal care.  She is currently monitored for the following issues for this low-risk pregnancy and has Tremors of nervous system; Anemia; Allergy; Heart palpitations; Allergic asthma, mild intermittent, with acute exacerbation; Irregular menses; Menstrual cramps; Cholecystitis; RUQ abdominal pain; Occasional tremors; Stuttering; Oligomenorrhea, unspecified; CAP (community acquired pneumonia); Iron deficiency anemia; Supervision of other normal pregnancy, antepartum; and Rh negative state in antepartum period on their problem list.  ----------------------------------------------------------------------------------- Patient reports thinks her fluid is leaking.   Contractions: Irregular. Vag. Bleeding: None.  Movement: (!) Decreased. Leaking Fluid admits to.  ----------------------------------------------------------------------------------- The following portions of the patient's history were reviewed and updated as appropriate: allergies, current medications, past family history, past medical history, past social history, past surgical history and problem list. Problem list updated.  Objective  Blood pressure 110/80, weight 233 lb (105.7 kg), last menstrual period 04/24/2018. Pregravid weight 227 lb (103 kg) Total Weight Gain 6 lb (2.722 kg) Urinalysis: Urine Protein Negative  Urine Glucose Negative  Fetal Status: Fetal Heart Rate (bpm): 130 Fundal Height: 36 cm Movement: (!) Decreased  Presentation: Vertex  General:  Alert, oriented and cooperative. Patient is in no acute distress.  Skin: Skin is warm and dry. No rash noted.   Cardiovascular: Normal heart rate noted  Respiratory: Normal respiratory effort, no problems with respiration noted  Abdomen: Soft, gravid, appropriate for gestational age. Pain/Pressure: Absent     Pelvic:  Cervical exam performed Dilation: Closed  Effacement (%): 50 Station: -3  Extremities: Normal range of motion.  Edema: None  Mental Status: Normal mood and affect. Normal behavior. Normal judgment and thought content.   ROM workup: Pooling: negative Nitrazine: negative Ferning: negative  NST Baseline FHR: 130 beats/min Variability: moderate Accelerations: present Decelerations: absent Tocometry: not done  Interpretation:  INDICATIONS: decreased fetal movement RESULTS:  A NST procedure was performed with FHR monitoring and a normal baseline established, appropriate time of 20-40 minutes of evaluation, and accels >2 seen w 15x15 characteristics.  Results show a REACTIVE NST.    Assessment   28 y.o. G3P2002 at [redacted]w[redacted]d by  01/29/2019, by Last Menstrual Period presenting for routine prenatal visit  Plan   Preganacy #3 Problems (from 04/24/18 to present)    Problem Noted Resolved   Rh negative state in antepartum period 08/08/2018 by Will Bonnet, MD No   Overview Signed 08/08/2018 10:39 AM by Will Bonnet, MD    [ ]  rhogam 28 weeks [ ]  rhogam pp, prn (new FOB)      Supervision of other normal pregnancy, antepartum 07/11/2018 by Homero Fellers, MD No   Overview Addendum 11/25/2018  9:47 AM by Rexene Agent, [place] Prenatal Labs  Dating  LMP=10wk [place] Blood type: A/Negative/-- (03/11 1449)   Genetic Screen NIPS:   pending Antibody:Negative (03/11 1449)  Anatomic [place] Complete 09/29/2018 Rubella: 3.52 (03/11 1449)  Varicella: Immune  GTT 28 wk:  99   RPR: Non Reactive (03/11 1449)   Rhogam 10/28/18, 26 weeks HBsAg: Negative (03/11 1449)   TDaP vaccine 04/01/2018                      HIV: Non Reactive (03/11 1449)   Flu Shot 2019  GBS:   Contraception  Pap:2018 NIL  CBB     CS/VBAC    Baby Food    Support Person                 Preterm labor symptoms and general obstetric precautions including but not limited to vaginal bleeding, contractions, leaking of  fluid and fetal movement were reviewed in detail with the patient. Please refer to After Visit Summary for other counseling recommendations.   Patient reassured regarding LOF and decreased fetal movement GBS/aptima today Flu vaccine today  Return in about 1 week (around 01/13/2019) for Routine Prenatal Appointment.  Prentice Docker, MD, [facility], [facility] 01/06/2019 11:04 AM

## 2019-01-08 LAB — STREP GP B NAA: Strep Gp B NAA: POSITIVE — AB

## 2019-01-13 ENCOUNTER — Other Ambulatory Visit: Payer: Self-pay

## 2019-01-13 ENCOUNTER — Encounter: Payer: Self-pay | Admitting: Advanced Practice Midwife

## 2019-01-13 ENCOUNTER — Ambulatory Visit (INDEPENDENT_AMBULATORY_CARE_PROVIDER_SITE_OTHER): Payer: BC Managed Care – PPO | Admitting: Advanced Practice Midwife

## 2019-01-13 VITALS — BP 104/74 | Wt 229.0 lb

## 2019-01-13 DIAGNOSIS — O26893 Other specified pregnancy related conditions, third trimester: Secondary | ICD-10-CM

## 2019-01-13 DIAGNOSIS — Z3A37 37 weeks gestation of pregnancy: Secondary | ICD-10-CM

## 2019-01-13 DIAGNOSIS — Z6791 Unspecified blood type, Rh negative: Secondary | ICD-10-CM

## 2019-01-13 NOTE — Progress Notes (Signed)
ROB

## 2019-01-13 NOTE — Patient Instructions (Signed)
Braxton Hicks Contractions Contractions of the uterus can occur throughout pregnancy, but they are not always a sign that you are in labor. You may have practice contractions called Braxton Hicks contractions. These false labor contractions are sometimes confused with true labor. What are Braxton Hicks contractions? Braxton Hicks contractions are tightening movements that occur in the muscles of the uterus before labor. Unlike true labor contractions, these contractions do not result in opening (dilation) and thinning of the cervix. Toward the end of pregnancy (32-34 weeks), Braxton Hicks contractions can happen more often and may become stronger. These contractions are sometimes difficult to tell apart from true labor because they can be very uncomfortable. You should not feel embarrassed if you go to the hospital with false labor. Sometimes, the only way to tell if you are in true labor is for your health care provider to look for changes in the cervix. The health care provider will do a physical exam and may monitor your contractions. If you are not in true labor, the exam should show that your cervix is not dilating and your water has not broken. If there are no other health problems associated with your pregnancy, it is completely safe for you to be sent home with false labor. You may continue to have Braxton Hicks contractions until you go into true labor. How to tell the difference between true labor and false labor True labor  Contractions last 30-70 seconds.  Contractions become very regular.  Discomfort is usually felt in the top of the uterus, and it spreads to the lower abdomen and low back.  Contractions do not go away with walking.  Contractions usually become more intense and increase in frequency.  The cervix dilates and gets thinner. False labor  Contractions are usually shorter and not as strong as true labor contractions.  Contractions are usually irregular.  Contractions  are often felt in the front of the lower abdomen and in the groin.  Contractions may go away when you walk around or change positions while lying down.  Contractions get weaker and are shorter-lasting as time goes on.  The cervix usually does not dilate or become thin. Follow these instructions at home:   Take over-the-counter and prescription medicines only as told by your health care provider.  Keep up with your usual exercises and follow other instructions from your health care provider.  Eat and drink lightly if you think you are going into labor.  If Braxton Hicks contractions are making you uncomfortable: ? Change your position from lying down or resting to walking, or change from walking to resting. ? Sit and rest in a tub of warm water. ? Drink enough fluid to keep your urine pale yellow. Dehydration may cause these contractions. ? Do slow and deep breathing several times an hour.  Keep all follow-up prenatal visits as told by your health care provider. This is important. Contact a health care provider if:  You have a fever.  You have continuous pain in your abdomen. Get help right away if:  Your contractions become stronger, more regular, and closer together.  You have fluid leaking or gushing from your vagina.  You pass blood-tinged mucus (bloody show).  You have bleeding from your vagina.  You have low back pain that you never had before.  You feel your baby's head pushing down and causing pelvic pressure.  Your baby is not moving inside you as much as it used to. Summary  Contractions that occur before labor are   called Braxton Hicks contractions, false labor, or practice contractions.  Braxton Hicks contractions are usually shorter, weaker, farther apart, and less regular than true labor contractions. True labor contractions usually become progressively stronger and regular, and they become more frequent.  Manage discomfort from Braxton Hicks contractions  by changing position, resting in a warm bath, drinking plenty of water, or practicing deep breathing. This information is not intended to replace advice given to you by your health care provider. Make sure you discuss any questions you have with your health care provider. Document Released: 08/13/2016 Document Revised: 03/12/2017 Document Reviewed: 08/13/2016 Elsevier Patient Education  2020 [employer]  

## 2019-01-13 NOTE — Progress Notes (Signed)
Routine Prenatal Care Visit  Subjective  Jacqueline Fields is a 28 y.o. G3P2002 at [redacted]w[redacted]d being seen today for ongoing prenatal care.  She is currently monitored for the following issues for this low-risk pregnancy and has Tremors of nervous system; Anemia; Allergy; Heart palpitations; Allergic asthma, mild intermittent, with acute exacerbation; Irregular menses; Menstrual cramps; Cholecystitis; RUQ abdominal pain; Occasional tremors; Stuttering; Oligomenorrhea, unspecified; CAP (community acquired pneumonia); Iron deficiency anemia; Supervision of other normal pregnancy, antepartum; and Rh negative state in antepartum period on their problem list.  ----------------------------------------------------------------------------------- Patient reports periods of every 10 minute contractions in the past week.  Reviewed GBS result and treatment plan. Contractions: Irregular. Vag. Bleeding: None.  Movement: Present. Leaking Fluid denies.  ----------------------------------------------------------------------------------- The following portions of the patient's history were reviewed and updated as appropriate: allergies, current medications, past family history, past medical history, past social history, past surgical history and problem list. Problem list updated.  Objective  Blood pressure 104/74, weight 229 lb (103.9 kg), last menstrual period 04/24/2018. Pregravid weight 227 lb (103 kg) Total Weight Gain 2 lb (0.907 kg) Urinalysis: Urine Protein    Urine Glucose    Fetal Status: Fetal Heart Rate (bpm): 139 Fundal Height: 37 cm Movement: Present  Presentation: Vertex  General:  Alert, oriented and cooperative. Patient is in no acute distress.  Skin: Skin is warm and dry. No rash noted.   Cardiovascular: Normal heart rate noted  Respiratory: Normal respiratory effort, no problems with respiration noted  Abdomen: Soft, gravid, appropriate for gestational age. Pain/Pressure: Present     Pelvic:   Cervical exam performed Dilation: 1 Effacement (%): 50 Station: -2, posterior  Extremities: Normal range of motion.  Edema: None  Mental Status: Normal mood and affect. Normal behavior. Normal judgment and thought content.   Assessment   28 y.o. G3P2002 at [redacted]w[redacted]d by  01/29/2019, by Last Menstrual Period presenting for routine prenatal visit  Plan   Preganacy #3 Problems (from 04/24/18 to present)    Problem Noted Resolved   Rh negative state in antepartum period 08/08/2018 by Will Bonnet, MD No   Overview Signed 08/08/2018 10:39 AM by Will Bonnet, MD    [ ]  rhogam 28 weeks [ ]  rhogam pp, prn (new FOB)      Supervision of other normal pregnancy, antepartum 07/11/2018 by Homero Fellers, MD No   Overview Addendum 11/25/2018  9:47 AM by Rexene Agent, [place] Prenatal Labs  Dating  LMP=10wk [place] Blood type: A/Negative/-- (03/11 1449)   Genetic Screen NIPS:   pending Antibody:Negative (03/11 1449)  Anatomic [place] Complete 09/29/2018 Rubella: 3.52 (03/11 1449)  Varicella: Immune  GTT 28 wk:  99   RPR: Non Reactive (03/11 1449)   Rhogam 10/28/18, 26 weeks HBsAg: Negative (03/11 1449)   TDaP vaccine 04/01/2018                      HIV: Non Reactive (03/11 1449)   Flu Shot 2019                           GBS:   Contraception  Pap:2018 NIL  CBB     CS/VBAC    Baby Food    Support Person                 Term labor symptoms and general obstetric precautions including but not limited to vaginal bleeding, contractions, leaking  of fluid and fetal movement were reviewed in detail with the patient. Please refer to After Visit Summary for other counseling recommendations.   Return in about 1 week (around 01/20/2019) for rob.  Rod Can, CNM 01/13/2019 10:08 AM

## 2019-01-16 ENCOUNTER — Observation Stay
Admission: EM | Admit: 2019-01-16 | Discharge: 2019-01-17 | Disposition: A | Discharge status: Home Health | Payer: BC Managed Care – PPO | Attending: Certified Nurse Midwife | Admitting: Certified Nurse Midwife

## 2019-01-16 ENCOUNTER — Other Ambulatory Visit: Payer: Self-pay

## 2019-01-16 DIAGNOSIS — O98813 Other maternal infectious and parasitic diseases complicating pregnancy, third trimester: Secondary | ICD-10-CM | POA: Diagnosis not present

## 2019-01-16 DIAGNOSIS — O99013 Anemia complicating pregnancy, third trimester: Secondary | ICD-10-CM | POA: Insufficient documentation

## 2019-01-16 DIAGNOSIS — Z6791 Unspecified blood type, Rh negative: Secondary | ICD-10-CM

## 2019-01-16 DIAGNOSIS — D509 Iron deficiency anemia, unspecified: Secondary | ICD-10-CM | POA: Diagnosis not present

## 2019-01-16 DIAGNOSIS — Z0371 Encounter for suspected problem with amniotic cavity and membrane ruled out: Secondary | ICD-10-CM | POA: Diagnosis not present

## 2019-01-16 DIAGNOSIS — O99820 Streptococcus B carrier state complicating pregnancy: Secondary | ICD-10-CM | POA: Diagnosis not present

## 2019-01-16 DIAGNOSIS — O471 False labor at or after 37 completed weeks of gestation: Secondary | ICD-10-CM | POA: Diagnosis not present

## 2019-01-16 DIAGNOSIS — Z79899 Other long term (current) drug therapy: Secondary | ICD-10-CM | POA: Insufficient documentation

## 2019-01-16 DIAGNOSIS — N898 Other specified noninflammatory disorders of vagina: Secondary | ICD-10-CM

## 2019-01-16 DIAGNOSIS — O26893 Other specified pregnancy related conditions, third trimester: Secondary | ICD-10-CM | POA: Diagnosis not present

## 2019-01-16 DIAGNOSIS — Z3480 Encounter for supervision of other normal pregnancy, unspecified trimester: Secondary | ICD-10-CM

## 2019-01-16 DIAGNOSIS — B951 Streptococcus, group B, as the cause of diseases classified elsewhere: Secondary | ICD-10-CM | POA: Diagnosis not present

## 2019-01-16 DIAGNOSIS — Z3A38 38 weeks gestation of pregnancy: Secondary | ICD-10-CM | POA: Insufficient documentation

## 2019-01-16 DIAGNOSIS — O26899 Other specified pregnancy related conditions, unspecified trimester: Secondary | ICD-10-CM

## 2019-01-16 NOTE — OB Triage Note (Signed)
Pt. 28 yo, G3P2, [redacted]w[redacted]d presents to unit with c/o ctxs every 4-6 minutes apart and leaking of fluid. States she started feeling uncomfortable around 1 pm, and reports she has been leaking clear fluid over the past few hours. No vaginal bleeding, feels baby move ok. Reports ctx pain at 6/10. Monitors applied and assessing. Vitals stable.

## 2019-01-17 ENCOUNTER — Encounter: Payer: Self-pay | Admitting: Certified Nurse Midwife

## 2019-01-17 DIAGNOSIS — D509 Iron deficiency anemia, unspecified: Secondary | ICD-10-CM | POA: Diagnosis not present

## 2019-01-17 DIAGNOSIS — Z79899 Other long term (current) drug therapy: Secondary | ICD-10-CM | POA: Diagnosis not present

## 2019-01-17 DIAGNOSIS — Z0371 Encounter for suspected problem with amniotic cavity and membrane ruled out: Secondary | ICD-10-CM | POA: Diagnosis not present

## 2019-01-17 DIAGNOSIS — O99013 Anemia complicating pregnancy, third trimester: Secondary | ICD-10-CM | POA: Diagnosis not present

## 2019-01-17 DIAGNOSIS — Z3A38 38 weeks gestation of pregnancy: Secondary | ICD-10-CM | POA: Diagnosis not present

## 2019-01-17 DIAGNOSIS — O471 False labor at or after 37 completed weeks of gestation: Secondary | ICD-10-CM | POA: Diagnosis not present

## 2019-01-17 DIAGNOSIS — O98813 Other maternal infectious and parasitic diseases complicating pregnancy, third trimester: Secondary | ICD-10-CM | POA: Diagnosis not present

## 2019-01-17 DIAGNOSIS — B951 Streptococcus, group B, as the cause of diseases classified elsewhere: Secondary | ICD-10-CM | POA: Diagnosis not present

## 2019-01-17 NOTE — Final Progress Note (Signed)
Physician Final Progress Note  Patient ID: Jacqueline Fields MRN: [id number] DOB/AGE: [birth year] 28 y.o.  Admit date: 01/16/2019 Admitting provider: Will Bonnet, MD/ [facility]. Danise Mina, CNM Discharge date: 01/17/2019   Admission Diagnoses: IUP at 38wk2d with contractions and leakage of fluid Discharge Diagnoses:  IUP at 38wk2d with latent vs false labor Consults: None  Significant Findings/ Diagnostic Studies:  Jacqueline Fields is a 28 y.o. G21P2002 female with EDC=01/29/2019 at [redacted]w[redacted]d dated by LMP.  Her pregnancy has been complicated by Rh negative status, iron deficiency anemia, and GBS positive..  She presented to L&D for evaluation of leakage of fluid and onset contractions. Her membranes were not ruptured (mucoid discharge and negative ferning). Her cervix change minimally from 1 to 1.5 cm after the first 2 hours of observation. There was no further cervical change over the next 2 hours. Cervix remained 1.5/50% and baby was ballotable. Baby was very active and had a reactive NST.  Patient desired discharge. Was advised to return for worsening contractions, leakage of water, vaginal bleeding or decreased fetal movement. Procedures: none  Discharge Condition: stable  Disposition: Discharge disposition: 01-Home or Self Care       Diet: Regular diet  Discharge Activity: Activity as tolerated   Allergies as of 01/17/2019   No Known Allergies     Medication List    TAKE these medications   albuterol 108 (90 Base) MCG/ACT inhaler Commonly known as: VENTOLIN HFA Inhale 2-4 puffs by mouth every 4 hours as needed for wheezing, cough, and/or shortness of breath   B-12 PO Take 1 tablet by mouth daily.   cetirizine 10 MG tablet Commonly known as: ZYRTEC Take 10 mg by mouth daily.   Prenatal Gummies/DHA & FA 0.4-32.5 MG Chew Chew by mouth.        Total time spent taking care of this patient: 30 minutes  Signed: Dalia Heading 01/17/2019, 12:36 AM

## 2019-01-17 NOTE — Discharge Instructions (Signed)
LABOR: When contractions begin, you should start to time them from the beginning of one contraction to the beginning of the next.  When contractions are 5-10 minutes apart or less and have been regular for at least an hour, you should call your health care provider.  Notify your doctor if any of the following occur: 1. Bleeding from the vagina 7. Sudden, constant, or occasional abdominal pain  2. Pain or burning when urinating 8. Sudden gushing of fluid from the vagina (with or without continued leaking)  3. Chills or fever 9. Fainting spells, "black outs" or loss of consciousness  4. Increase in vaginal discharge 10. Severe or continued nausea or vomiting  5. Pelvic pressure (sudden increase) 11. Blurring of vision or spots before the eyes  6. Baby moving less than usual 12. Leaking of fluid    FETAL KICK COUNT: Lie on your left side for one hour after a meal, and count the number of times your baby kicks. If it is less than 5 times, get up, move around and drink some juice. Repeat the test 30 minutes later. If it is still less than 5 kicks in an hour, notify your doctor.

## 2019-01-17 NOTE — Progress Notes (Signed)
OB History & Physical   History of Present Illness:  Chief Complaint:  Contractions and leakage fluid HPI:  Jacqueline Fields is a 28 y.o. G50P2002 female with EDC=01/29/2019 at [redacted]w[redacted]d dated by LMP.  Her pregnancy has been complicated by Rh negative status, iron deficinecy anemia, and GBS positive..  She presents to L&D for evaluation of leakage of fluid and onset contractions. Contractions began at 1300 and were every 10 minutes. She started to feel some leakage of fluid at that time and put a pad on. She stated that there was a small amount of clear drainage that increased when when her contractions became more frequent at 1730 this evening.   Prenatal care site: Prenatal care at [place] has also  been remarkable for a weight gain of about 1kg with the pregnancy and for the following: [facility] Prenatal Labs  Dating  LMP=10wk [place] Blood type: A/Negative/-- (03/11 1449)   Genetic Screen NIPS: diploid XY Antibody:Negative (03/11 1449)  Anatomic [place] Complete 09/29/2018 Rubella: 3.52 (03/11 1449)  Varicella: Immune  GTT 28 wk:  99   RPR: Non Reactive (03/11 1449)   Rhogam 10/28/18, 26 weeks HBsAg: Negative (03/11 1449)   TDaP vaccine 04/01/2018                      HIV: Non Reactive (03/11 1449)   Flu Shot 2019                           GBS: positive  Contraception Depo Pap:2018 NIL  CBB     CS/VBAC    Baby Food Breast   Support Person      Pelvis proven to 8#.      Maternal Medical History:   Past Medical History:  Diagnosis Date  . Allergy   . Anemia   . Iron deficiency anemia 05/04/2018  . Normal labor 07/10/2015  . Pneumonia 02/2018,05/2018    Past Surgical History:  Procedure Laterality Date  . ADENOIDECTOMY    . CHOLECYSTECTOMY N/A 11/15/2015   Procedure: LAPAROSCOPIC CHOLECYSTECTOMY WITH INTRAOPERATIVE CHOLANGIOGRAM;  Surgeon: Clayburn Pert, MD;  Location: [facility] ORS;  Service: General;  Laterality: N/A;  . COLONOSCOPY WITH PROPOFOL N/A 05/11/2017   Procedure:  COLONOSCOPY WITH PROPOFOL;  Surgeon: Jonathon Bellows, MD;  Location: [facility] ENDOSCOPY;  Service: Gastroenterology;  Laterality: N/A;  . ESOPHAGOGASTRODUODENOSCOPY (EGD) WITH PROPOFOL N/A 05/11/2017   Procedure: ESOPHAGOGASTRODUODENOSCOPY (EGD) WITH PROPOFOL;  Surgeon: Jonathon Bellows, MD;  Location: [facility] ENDOSCOPY;  Service: Gastroenterology;  Laterality: N/A;    No Known Allergies  Prior to Admission medications   Medication Sig Start Date End Date Taking? Authorizing Provider  cetirizine (ZYRTEC) 10 MG tablet Take 10 mg by mouth daily.   Yes [provider]  Cyanocobalamin (B-12 PO) Take 1 tablet by mouth daily.   Yes [provider]  Prenatal MV-Min-FA-Omega-3 (PRENATAL GUMMIES/DHA & FA) 0.4-32.5 MG CHEW Chew by mouth.   Yes [provider]  albuterol (PROVENTIL HFA;VENTOLIN HFA) 108 (90 Base) MCG/ACT inhaler Inhale 2-4 puffs by mouth every 4 hours as needed for wheezing, cough, and/or shortness of breath 03/03/18   Gladstone Lighter, MD          Social History: She  reports that she has never smoked. She has never used smokeless tobacco. She reports that she does not drink alcohol or use drugs.  Family History: family history includes Breast cancer in her maternal aunt and paternal aunt; Breast cancer (  age of onset: 89) in her maternal aunt; Cancer in her maternal grandfather; Diabetes in her paternal grandmother; Heart attack in her paternal grandfather; Lung cancer (age of onset: 2) in her maternal aunt; Non-Hodgkin's lymphoma (age of onset: 76) in her maternal aunt; Pancreatic cancer (age of onset: 67) in her maternal grandfather; Stroke in her maternal grandfather; Thyroid cancer in her cousin.   Review of Systems: Negative x 10 systems reviewed except as noted in the HPI.      Physical Exam:  Vital Signs: BP 127/79 (BP Location: Left Arm)   Pulse (!) 109   Temp 98.3 F (36.8 C) (Oral)   Resp 18   Ht 5\' 8"  (1.727 m)   Wt 103.4 kg   LMP 04/24/2018 (Exact  Date)   BMI 34.67 kg/m  General: gravid WF, grimacing with some contractions HEENT: normocephalic, atraumatic Heart: regular rate & rhythm.  No murmurs/rubs/gallops Lungs: clear to auscultation bilaterally Abdomen: soft, gravid, tender with contractions. Pelvic:  SSE:  External: Normal external female genitalia  Vagina: small amount of mucoid discharge seen  Cervix: Dilation: 1 / Effacement (%): TH/ Station: -1   ROM: - pooling; - ferning Extremities: non-tender, symmetric, no edema bilaterally.  DTRs: +1 Neurologic: Alert & oriented x 3.   Baseline FHR: 135-140 with accelerations to 160s to 170, moderate variability Toco: able to palpate contractions, but difficulty picking up with toco   Bedside Ultrasound:  Presentation: cephalic  Fluid: [id number]  Placental Location: anterior  Assessment:  Jacqueline Fields is a 28 y.o. G22P2002 female at [redacted]w[redacted]d with no evidence of SROM-appears to be mucous plug Possible early labor  Plan:  1. Admit to Labor & Delivery for observation 2. Po fluids/ can ambulate/ intermittent FHR monitoring 3. GBS positive-will need PPX when admitted for delivery.   4. Consents obtained. 5. Recheck cervix in 2 hours  Dalia Heading , CNM

## 2019-01-20 ENCOUNTER — Ambulatory Visit (INDEPENDENT_AMBULATORY_CARE_PROVIDER_SITE_OTHER): Payer: BC Managed Care – PPO | Admitting: Certified Nurse Midwife

## 2019-01-20 ENCOUNTER — Other Ambulatory Visit: Payer: Self-pay

## 2019-01-20 VITALS — BP 110/70 | Wt 228.0 lb

## 2019-01-20 DIAGNOSIS — Z3483 Encounter for supervision of other normal pregnancy, third trimester: Secondary | ICD-10-CM

## 2019-01-20 DIAGNOSIS — Z3A38 38 weeks gestation of pregnancy: Secondary | ICD-10-CM

## 2019-01-20 DIAGNOSIS — Z3480 Encounter for supervision of other normal pregnancy, unspecified trimester: Secondary | ICD-10-CM

## 2019-01-20 DIAGNOSIS — Z01818 Encounter for other preprocedural examination: Secondary | ICD-10-CM

## 2019-01-20 LAB — POCT URINALYSIS DIPSTICK OB
Glucose, UA: NEGATIVE
POC,PROTEIN,UA: NEGATIVE

## 2019-01-20 NOTE — Progress Notes (Signed)
C/o [email or web address] [email or web address]

## 2019-01-21 ENCOUNTER — Encounter: Payer: Self-pay | Admitting: Certified Nurse Midwife

## 2019-01-21 NOTE — Progress Notes (Signed)
Jacqueline Fields at 38wk5d: Seen earlier this week in L&D with contractions and mucoid vaginal discharge. Continues to have irregular contractions and pelvic pain and pressure. Is very uncomfortable and is interested in induction of labor.Cervix: 1+/50%/-2 and posterior/ medium FH 35cm. FHTs WNL IOL scheduled for 15 October at 0800 See H&P Labor precautions Dalia Heading, CNM

## 2019-01-21 NOTE — H&P (Signed)
OB History & Physical   History of Present Illness:  Chief Complaint:  Presents for an elective induction of labor HPI:  Jacqueline Fields is a 28 y.o. G65P2002 female with EDC=01/29/2019 at [redacted]w[redacted]d dated by LMP.  Her pregnancy has been complicated by asthma, obesity,positive GBS culture, and being RH negative. She received Rhogam 10/28/18. She has gained only 1 pound with the pregnancy. EFW at 32 weeks was 3#14oz (8/28/).  She desires an elective induction of labor..  She has had irregular contractions for a couple of weeks and pelvic pain/ pressure. Baby moving well. Prenatal care site: Prenatal care at [facility] has also been remarkable for   [facility] P[facility]atal Labs  Dating  LMP=10wk [place] Blood type: A/Negative/-- (03/11 1449)   Genetic Screen NIPS:   pending Antibody:Negative (03/11 1449)  Anatomic [place] Complete 09/29/2018 Rubella: 3.52 (03/11 1449)  Varicella: Immune  GTT 28 wk:  99   RPR: Non Reactive (03/11 1449)   Rhogam 10/28/18, 26 weeks HBsAg: Negative (03/11 1449)   TDaP vaccine 04/01/2018                      HIV: Non Reactive (03/11 1449)   Flu Shot          01/06/19               GBS: Positive  Contraception Depo Pap:2018 NIL  CBB     CS/VBAC    Baby Food Breast   Support Person      OB History  Gravida Para Term Preterm AB Living  3 2 2  0 0 2  SAB TAB Ectopic Multiple Live Births  0 0 0 0 2    # Outcome Date GA Lbr Len/2nd Weight Sex Delivery Anes PTL Lv  3 Current           2 Term 07/10/15 [redacted]w[redacted]d 02:40 / 00:04 8 lb (3.629 kg) M Vag-Spont None  LIV  1 Term 09/06/13 [redacted]w[redacted]d  7 lb 13 oz (3.544 kg) M Vag-Spont   LIV       Maternal Medical History:   Past Medical History:  Diagnosis Date  . Allergy   . Anemia   . Iron deficiency anemia 05/04/2018  . Pneumonia 02/2018,05/2018    Past Surgical History:  Procedure Laterality Date  . ADENOIDECTOMY    . CHOLECYSTECTOMY N/A 11/15/2015   Procedure: LAPAROSCOPIC CHOLECYSTECTOMY WITH INTRAOPERATIVE CHOLANGIOGRAM;  Surgeon:  Clayburn Pert, MD;  Location: [facility] ORS;  Service: General;  Laterality: N/A;  . COLONOSCOPY WITH PROPOFOL N/A 05/11/2017   Procedure: COLONOSCOPY WITH PROPOFOL;  Surgeon: Jonathon Bellows, MD;  Location: [facility] ENDOSCOPY;  Service: Gastroenterology;  Laterality: N/A;  . ESOPHAGOGASTRODUODENOSCOPY (EGD) WITH PROPOFOL N/A 05/11/2017   Procedure: ESOPHAGOGASTRODUODENOSCOPY (EGD) WITH PROPOFOL;  Surgeon: Jonathon Bellows, MD;  Location: [facility] ENDOSCOPY;  Service: Gastroenterology;  Laterality: N/A;    No Known Allergies  Prior to Admission medications   Medication Sig Start Date End Date Taking? Authorizing Provider  albuterol (PROVENTIL HFA;VENTOLIN HFA) 108 (90 Base) MCG/ACT inhaler Inhale 2-4 puffs by mouth every 4 hours as needed for wheezing, cough, and/or shortness of breath 03/03/18  Yes Gladstone Lighter, MD  cetirizine (ZYRTEC) 10 MG tablet Take 10 mg by mouth daily.   Yes [provider]  Cyanocobalamin (B-12 PO) Take 1 tablet by mouth daily.   Yes [provider]  Prenatal MV-Min-FA-Omega-3 (PRENATAL GUMMIES/DHA & FA) 0.4-32.5 MG CHEW Chew by mouth.   Yes [provider]  Social History: She  reports that she has never smoked. She has never used smokeless tobacco. She reports that she does not drink alcohol or use drugs.  Family History: family history includes Breast cancer in her maternal aunt and paternal aunt; Breast cancer (age of onset: 7) in her maternal aunt; Cancer in her maternal grandfather; Diabetes in her paternal grandmother; Heart attack in her paternal grandfather; Lung cancer (age of onset: 44) in her maternal aunt; Non-Hodgkin's lymphoma (age of onset: 93) in her maternal aunt; Pancreatic cancer (age of onset: 63) in her maternal grandfather; Stroke in her maternal grandfather; Thyroid cancer in her cousin.   Review of Systems: Negative x 10 systems reviewed except as noted in the HPI.      Physical Exam:  Vital Signs: BP 110/70   Wt 228 lb (103.4  kg)   LMP 04/24/2018 (Exact Date)   BMI 34.67 kg/m  General: no acute distress.  HEENT: normocephalic, atraumatic Heart: regular rate & rhythm.  No murmurs/rubs/gallops Lungs: clear to auscultation bilaterally Abdomen: soft, gravid, tender with contractions;  EFW: [id number] cephalic/ FH 35 cm. FHTs 152 Pelvic:   External: Normal external female genitalia  Cervix: Dilation: 1 / Effacement (%): 50 / Station: -2 / posterior/medium   Extremities: non-tender, symmetric, no edema bilaterally.   Neurologic: Alert & oriented x 3.    Assessment:  Jacqueline Fields is a 28 y.o. G31P2002 female with EDC=01/29/2019 desires elective induction of labor   Plan:  1. Scheduled for elective induction of labor 01/26/2019  2. Discussed induction of labor process. Aware of risks of hyperstimulation, fetal intolerance of labor, serial induction and Cesarean section. Patient wishes to proceed with plan for induction 3. GBS positive-will need penicillin for GBS PPX.   4. Will get COVID testing prior to admission 5. Breast/ Depo 6. TDAP given prior to this pregnancy 03/2018 7. Flu vaccine 01/06/2019 8. RH negative: may need Rhogam postpartum (A negative/RI/VI)  Dalia Heading  01/21/2019 12:10 PM

## 2019-01-24 ENCOUNTER — Other Ambulatory Visit: Payer: Self-pay

## 2019-01-24 ENCOUNTER — Other Ambulatory Visit
Admission: RE | Admit: 2019-01-24 | Discharge: 2019-01-24 | Disposition: A | Discharge status: Home / Self Care | Payer: BC Managed Care – PPO | Source: Ambulatory Visit | Attending: Certified Nurse Midwife | Admitting: Certified Nurse Midwife

## 2019-01-24 DIAGNOSIS — Z01812 Encounter for preprocedural laboratory examination: Secondary | ICD-10-CM | POA: Insufficient documentation

## 2019-01-24 DIAGNOSIS — Z20828 Contact with and (suspected) exposure to other viral communicable diseases: Secondary | ICD-10-CM | POA: Insufficient documentation

## 2019-01-24 LAB — SARS CORONAVIRUS 2 (TAT 6-24 HRS): SARS Coronavirus 2: NEGATIVE

## 2019-01-26 ENCOUNTER — Other Ambulatory Visit: Payer: Self-pay

## 2019-01-26 ENCOUNTER — Inpatient Hospital Stay: Payer: BC Managed Care – PPO | Admitting: Anesthesiology

## 2019-01-26 ENCOUNTER — Inpatient Hospital Stay
Admission: RE | Admit: 2019-01-26 | Discharge: 2019-01-28 | DRG: 807 | Disposition: A | Discharge status: Home / Self Care | Payer: BC Managed Care – PPO | Attending: Certified Nurse Midwife | Admitting: Certified Nurse Midwife

## 2019-01-26 DIAGNOSIS — Z3490 Encounter for supervision of normal pregnancy, unspecified, unspecified trimester: Secondary | ICD-10-CM | POA: Diagnosis present

## 2019-01-26 DIAGNOSIS — O26899 Other specified pregnancy related conditions, unspecified trimester: Secondary | ICD-10-CM

## 2019-01-26 DIAGNOSIS — Z3480 Encounter for supervision of other normal pregnancy, unspecified trimester: Secondary | ICD-10-CM

## 2019-01-26 DIAGNOSIS — Z3A39 39 weeks gestation of pregnancy: Secondary | ICD-10-CM | POA: Diagnosis not present

## 2019-01-26 DIAGNOSIS — Z6791 Unspecified blood type, Rh negative: Secondary | ICD-10-CM

## 2019-01-26 DIAGNOSIS — O9902 Anemia complicating childbirth: Principal | ICD-10-CM | POA: Diagnosis present

## 2019-01-26 DIAGNOSIS — D649 Anemia, unspecified: Secondary | ICD-10-CM | POA: Diagnosis present

## 2019-01-26 DIAGNOSIS — O4202 Full-term premature rupture of membranes, onset of labor within 24 hours of rupture: Secondary | ICD-10-CM | POA: Diagnosis not present

## 2019-01-26 DIAGNOSIS — O99824 Streptococcus B carrier state complicating childbirth: Secondary | ICD-10-CM | POA: Diagnosis not present

## 2019-01-26 DIAGNOSIS — D508 Other iron deficiency anemias: Secondary | ICD-10-CM

## 2019-01-26 DIAGNOSIS — O26893 Other specified pregnancy related conditions, third trimester: Secondary | ICD-10-CM | POA: Diagnosis not present

## 2019-01-26 DIAGNOSIS — Z23 Encounter for immunization: Secondary | ICD-10-CM | POA: Diagnosis not present

## 2019-01-26 LAB — CBC
HCT: 32.6 % — ABNORMAL LOW (ref 36.0–46.0)
Hemoglobin: 10.8 g/dL — ABNORMAL LOW (ref 12.0–15.0)
MCH: 28.1 pg (ref 26.0–34.0)
MCHC: 33.1 g/dL (ref 30.0–36.0)
MCV: 84.7 fL (ref 80.0–100.0)
Platelets: 377 10*3/uL (ref 150–400)
RBC: 3.85 MIL/uL — ABNORMAL LOW (ref 3.87–5.11)
RDW: 14.8 % (ref 11.5–15.5)
WBC: 13.8 10*3/uL — ABNORMAL HIGH (ref 4.0–10.5)
nRBC: 0 % (ref 0.0–0.2)

## 2019-01-26 LAB — TYPE AND SCREEN
ABO/RH(D): A NEG
Antibody Screen: NEGATIVE

## 2019-01-26 MED ORDER — TERBUTALINE SULFATE 1 MG/ML IJ SOLN: 0.2500 mg | Freq: Once | INTRAMUSCULAR | Status: DC | PRN

## 2019-01-26 MED ORDER — LIDOCAINE HCL (PF) 1 % IJ SOLN
30.0000 mL | INTRAMUSCULAR | Status: DC | PRN
  Filled 2019-01-26: qty 30

## 2019-01-26 MED ORDER — LACTATED RINGERS IV SOLN: 500.0000 mL | INTRAVENOUS | Status: DC | PRN

## 2019-01-26 MED ORDER — LACTATED RINGERS IV SOLN: 500.0000 mL | Freq: Once | INTRAVENOUS | Status: DC

## 2019-01-26 MED ORDER — MISOPROSTOL 25 MCG QUARTER TABLET
25.0000 ug | ORAL_TABLET | ORAL | Status: DC | PRN
  Administered 2019-01-26: 25 ug via VAGINAL
  Filled 2019-01-26: qty 1

## 2019-01-26 MED ORDER — FENTANYL 2.5 MCG/ML W/ROPIVACAINE 0.15% IN NS 100 ML EPIDURAL (ARMC)
12.0000 mL/h | EPIDURAL | Status: DC
  Administered 2019-01-27: 12 mL/h via EPIDURAL

## 2019-01-26 MED ORDER — DIPHENHYDRAMINE HCL 50 MG/ML IJ SOLN: 12.5000 mg | INTRAMUSCULAR | Status: DC | PRN

## 2019-01-26 MED ORDER — PHENYLEPHRINE 40 MCG/ML (10ML) SYRINGE FOR IV PUSH (FOR BLOOD PRESSURE SUPPORT): 80.0000 ug | PREFILLED_SYRINGE | INTRAVENOUS | Status: DC | PRN

## 2019-01-26 MED ORDER — PENICILLIN G 3 MILLION UNITS IVPB - SIMPLE MED
3.0000 10*6.[IU] | INTRAVENOUS | Status: DC
  Administered 2019-01-26 – 2019-01-27 (×4): 3 10*6.[IU] via INTRAVENOUS
  Filled 2019-01-26 (×4): qty 100

## 2019-01-26 MED ORDER — OXYTOCIN 40 UNITS IN NORMAL SALINE INFUSION - SIMPLE MED
1.0000 m[IU]/min | INTRAVENOUS | Status: DC
  Administered 2019-01-26: 14:00:00 2 m[IU]/min via INTRAVENOUS
  Filled 2019-01-26: qty 1000

## 2019-01-26 MED ORDER — OXYTOCIN 40 UNITS IN NORMAL SALINE INFUSION - SIMPLE MED: 2.5000 [IU]/h | INTRAVENOUS | Status: DC

## 2019-01-26 MED ORDER — LIDOCAINE HCL (PF) 1 % IJ SOLN
INTRAMUSCULAR | Status: DC | PRN
  Administered 2019-01-26: 3 mL

## 2019-01-26 MED ORDER — LACTATED RINGERS IV SOLN
INTRAVENOUS | Status: DC
  Administered 2019-01-26 – 2019-01-27 (×2): via INTRAVENOUS

## 2019-01-26 MED ORDER — MISOPROSTOL 200 MCG PO TABS
800.0000 ug | ORAL_TABLET | Freq: Once | ORAL | Status: DC | PRN
  Filled 2019-01-26: qty 4

## 2019-01-26 MED ORDER — SODIUM CHLORIDE 0.9 % IV SOLN
5.0000 10*6.[IU] | Freq: Once | INTRAVENOUS | Status: AC
  Administered 2019-01-26: 09:00:00 5 10*6.[IU] via INTRAVENOUS
  Filled 2019-01-26: qty 5

## 2019-01-26 MED ORDER — BUPIVACAINE HCL (PF) 0.25 % IJ SOLN
INTRAMUSCULAR | Status: DC | PRN
  Administered 2019-01-26: 8 mL via EPIDURAL

## 2019-01-26 MED ORDER — OXYTOCIN BOLUS FROM INFUSION
500.0000 mL | Freq: Once | INTRAVENOUS | Status: AC
  Administered 2019-01-27: 500 mL via INTRAVENOUS

## 2019-01-26 MED ORDER — EPHEDRINE 5 MG/ML INJ: 10.0000 mg | INTRAVENOUS | Status: DC | PRN

## 2019-01-26 MED ORDER — FENTANYL 2.5 MCG/ML W/ROPIVACAINE 0.15% IN NS 100 ML EPIDURAL (ARMC)
EPIDURAL | Status: AC
  Filled 2019-01-26: qty 100

## 2019-01-26 MED ORDER — AMMONIA AROMATIC IN INHA
0.3000 mL | Freq: Once | RESPIRATORY_TRACT | Status: DC
  Filled 2019-01-26: qty 10

## 2019-01-26 MED ORDER — LIDOCAINE-EPINEPHRINE (PF) 1.5 %-1:200000 IJ SOLN
INTRAMUSCULAR | Status: DC | PRN
  Administered 2019-01-26: 3 mL via PERINEURAL

## 2019-01-26 MED ORDER — EPHEDRINE 5 MG/ML INJ
10.0000 mg | INTRAVENOUS | Status: DC | PRN
  Administered 2019-01-27: 15 mg via INTRAVENOUS
  Filled 2019-01-26: qty 4

## 2019-01-26 NOTE — Anesthesia Procedure Notes (Signed)
Epidural Patient location during procedure: OB Start time: 01/26/2019 11:16 PM End time: 01/26/2019 11:29 PM  Staffing Anesthesiologist: Alvin Critchley, MD Performed: anesthesiologist   Preanesthetic Checklist Completed: patient identified, site marked, surgical consent, pre-op evaluation, timeout performed, IV checked, risks and benefits discussed and monitors and equipment checked  Epidural Patient position: sitting Prep: Betadine Patient monitoring: heart rate, continuous pulse ox and blood pressure Approach: midline Location: L3-L4 Injection technique: LOR air  Needle:  Needle type: Tuohy  Needle gauge: 18 G Needle length: 9 cm and 9 Catheter type: closed end flexible Catheter size: 20 Guage Test dose: negative and 1.5% lidocaine with Epi 1:200 K  Assessment Sensory level: T8 Events: blood not aspirated, injection not painful, no injection resistance, negative IV test and no paresthesia  Additional Notes Time out called.  Patient in sitting position and back prepped and draped in sterile fashion.  A skin wheal was made in the L3-L4 interspace with 1% Lidocaine plain.  A 17G Tuohy needle was advanced into the epidural space with a loss of resistance technique.  The epidural catheter was threaded 3cm into the epidural space and the TD was negative.  The patient tolerated the procedure well and the catheter was affixed to the back in sterile fashion.  No blood, fluid or paresthesias.Reason for block:procedure for pain

## 2019-01-26 NOTE — Anesthesia Preprocedure Evaluation (Signed)
Anesthesia Evaluation  Patient identified by MRN, date of birth, ID band Patient awake    Reviewed: Allergy & Precautions, H&P , NPO status , Patient's Chart, lab work & pertinent test results, reviewed documented beta blocker date and time   History of Anesthesia Complications Negative for: history of anesthetic complications  Airway Mallampati: II  TM Distance: >3 FB Neck ROM: full    Dental no notable dental hx. (+) Missing, Teeth Intact   Pulmonary neg shortness of breath, asthma , neg sleep apnea, neg COPD, neg recent URI,    Pulmonary exam normal breath sounds clear to auscultation       Cardiovascular Exercise Tolerance: Good negative cardio ROS Normal cardiovascular exam Rhythm:regular Rate:Normal     Neuro/Psych negative neurological ROS  negative psych ROS   GI/Hepatic negative GI ROS, Neg liver ROS, GERD  Medicated,  Endo/Other  negative endocrine ROS  Renal/GU negative Renal ROS  negative genitourinary   Musculoskeletal negative musculoskeletal ROS (+)   Abdominal   Peds negative pediatric ROS (+)  Hematology negative hematology ROS (+) anemia ,   Anesthesia Other Findings Past Medical History: No date: Allergy No date: Anemia   Reproductive/Obstetrics (+) Pregnancy                             Anesthesia Physical  Anesthesia Plan  ASA: II  Anesthesia Plan: Epidural   Post-op Pain Management:    Induction:   PONV Risk Score and Plan:   Airway Management Planned: Natural Airway  Additional Equipment:   Intra-op Plan:   Post-operative Plan:   Informed Consent: I have reviewed the patients History and Physical, chart, labs and discussed the procedure including the risks, benefits and alternatives for the proposed anesthesia with the patient or authorized representative who has indicated his/her understanding and acceptance.     Dental Advisory Given and  Dental advisory given  Plan Discussed with: Anesthesiologist, CRNA and Surgeon  Anesthesia Plan Comments:         Anesthesia Quick Evaluation

## 2019-01-26 NOTE — Progress Notes (Signed)
   Subjective:  Feeling increased pressure  Objective:   Vitals: Blood pressure 129/76, pulse 96, temperature 98.7 F (37.1 C), temperature source Oral, resp. rate 20, height 5\' 7"  (1.702 m), weight 103.4 kg, last menstrual period 04/24/2018, SpO2 97 %. General: NAD Abdomen: gravid, non-tender Cervical Exam:  Dilation: 4.5 Effacement (%): 70 Cervical Position: Middle Station: -1 Presentation: Vertex Exam by:: Steabler MD  FHT: 130, moderate, +accels, no decels Toco: q1-79min  Results for orders placed or performed during the hospital encounter of 01/26/19 (from the past 24 hour(s))  CBC     Status: Abnormal   Collection Time: 01/26/19  8:51 AM  Result Value Ref Range   WBC 13.8 (H) 4.0 - 10.5 K/uL   RBC 3.85 (L) 3.87 - 5.11 MIL/uL   Hemoglobin 10.8 (L) 12.0 - 15.0 g/dL   HCT 32.6 (L) 36.0 - 46.0 %   MCV 84.7 80.0 - 100.0 fL   MCH 28.1 26.0 - 34.0 pg   MCHC 33.1 30.0 - 36.0 g/dL   RDW 14.8 11.5 - 15.5 %   Platelets 377 150 - 400 K/uL   nRBC 0.0 0.0 - 0.2 %  Type and screen     Status: None   Collection Time: 01/26/19  8:51 AM  Result Value Ref Range   ABO/RH(D) A NEG    Antibody Screen NEG    Sample Expiration      01/29/2019,2359 Performed at [facility], [address], [place], [place] [address]     Assessment:   28 y.o. [phone] [redacted]w[redacted]d elective IOL  Plan:   1) Labor - continue to titrate pitocin  2) Fetus  - cat I tracing - last growth 32 weeks 1755g 3lbs 14oz, 25.6%ile  - pelvis tested to 8lbs - TWG 0.454 kg  Malachy Mood, MD, [place], [facility] 01/26/2019, 8:10 PM

## 2019-01-26 NOTE — Progress Notes (Signed)
   Subjective:  Contractions continue to stengthen  Objective:   Vitals: Blood pressure 129/76, pulse 96, temperature 98.7 F (37.1 C), temperature source Oral, resp. rate 20, height 5\' 7"  (1.702 m), weight 103.4 kg, last menstrual period 04/24/2018, SpO2 97 %. General: painfully contracting Abdomen: soft, non-tender Cervical Exam:  Dilation: 5.5 Effacement (%): 90 Cervical Position: Middle Station: -1 Presentation: Vertex Exam by:: Steabler MD  FHT: 130, moderate, +accels, no decels Toco: q1-46min  Results for orders placed or performed during the hospital encounter of 01/26/19 (from the past 24 hour(s))  CBC     Status: Abnormal   Collection Time: 01/26/19  8:51 AM  Result Value Ref Range   WBC 13.8 (H) 4.0 - 10.5 K/uL   RBC 3.85 (L) 3.87 - 5.11 MIL/uL   Hemoglobin 10.8 (L) 12.0 - 15.0 g/dL   HCT 32.6 (L) 36.0 - 46.0 %   MCV 84.7 80.0 - 100.0 fL   MCH 28.1 26.0 - 34.0 pg   MCHC 33.1 30.0 - 36.0 g/dL   RDW 14.8 11.5 - 15.5 %   Platelets 377 150 - 400 K/uL   nRBC 0.0 0.0 - 0.2 %  Type and screen     Status: None   Collection Time: 01/26/19  8:51 AM  Result Value Ref Range   ABO/RH(D) A NEG    Antibody Screen NEG    Sample Expiration      01/29/2019,2359 Performed at [facility], [address], [place], [place] [address]     Assessment:   28 y.o. [id number] [redacted]w[redacted]d elctive IOL  Plan:   1) Labor - continue to titrate pitocin  2) Fetus -cat I tracing - last growth 32 weeks 1755g 3lbs 14oz, 25.6%ile  - pelvis tested to 8lbs - TWG0.454 kg   Malachy Mood, MD, [place], [facility] 01/26/2019, 9:10 PM

## 2019-01-26 NOTE — Progress Notes (Signed)
History and Physical Interval Note:  01/26/2019  Jacqueline Fields has presented today for an elective INDUCTION OF LABOR at term. The various methods of treatment have been discussed with the patient and family. After consideration of risks, benefits and other options for treatment, the patient has consented to a labor induction. The patient's history has been reviewed, patient examined, no change in status, and is stable for induction as planned. See H&P entered on 01/20/2019. I have reviewed the patient's chart and labs.    Begin induction with Cytotec.   Avel Sensor, CNM

## 2019-01-26 NOTE — Progress Notes (Signed)
   Subjective:  Feeling increasing strength to contractions.  Cervical foley bulb came out an hour ago Objective:   Vitals: Blood pressure 124/77, pulse 87, temperature 97.9 F (36.6 C), temperature source Oral, resp. rate 18, height 5\' 7"  (1.702 m), weight 103.4 kg, last menstrual period 04/24/2018, SpO2 97 %. General: NAD Abdomen: gravid, non-tender Cervical Exam:  Dilation: 3.5 Effacement (%): 50, 60 Cervical Position: Middle Station: -2 Presentation: Vertex Exam by:: Dr. Georgianne Fick   FHT: 140, moderate variability, +accels, no decels Toco: q34min  Results for orders placed or performed during the hospital encounter of 01/26/19 (from the past 24 hour(s))  CBC     Status: Abnormal   Collection Time: 01/26/19  8:51 AM  Result Value Ref Range   WBC 13.8 (H) 4.0 - 10.5 K/uL   RBC 3.85 (L) 3.87 - 5.11 MIL/uL   Hemoglobin 10.8 (L) 12.0 - 15.0 g/dL   HCT 32.6 (L) 36.0 - 46.0 %   MCV 84.7 80.0 - 100.0 fL   MCH 28.1 26.0 - 34.0 pg   MCHC 33.1 30.0 - 36.0 g/dL   RDW 14.8 11.5 - 15.5 %   Platelets 377 150 - 400 K/uL   nRBC 0.0 0.0 - 0.2 %  Type and screen     Status: None   Collection Time: 01/26/19  8:51 AM  Result Value Ref Range   ABO/RH(D) A NEG    Antibody Screen NEG    Sample Expiration      01/29/2019,2359 Performed at [facility], [address], [place], [place] [address]     Assessment:   28 y.o. [id number] [redacted]w[redacted]d elective IOL  Plan:   1) Labor - AROM clear, continue pitocin  2) Fetus  - cat I tracing - last growth 32 weeks 1755g 3lbs 14oz, 25.6%ile  - pelvis tested to 8lbs - TWG 0.454 kg  Malachy Mood, MD, [place], [facility] 01/26/2019, 3:45 PM

## 2019-01-26 NOTE — Progress Notes (Signed)
   Subjective:  Doing well, comfortable now with epidural in place  Objective:   Vitals: Blood pressure 103/65, pulse 85, temperature 98.4 F (36.9 C), temperature source Oral, resp. rate 20, height 5\' 7"  (1.702 m), weight 103.4 kg, last menstrual period 04/24/2018, SpO2 99 %. General: NAD Abdomen: gravid, non-tender Cervical Exam:  Dilation: 6 Effacement (%): 90 Cervical Position: Middle Station: -1 Presentation: Vertex Exam by:: Steabler MD  FHT: 130, moderate variability, +accels, no decels Toco: q1-12min  Results for orders placed or performed during the hospital encounter of 01/26/19 (from the past 24 hour(s))  CBC     Status: Abnormal   Collection Time: 01/26/19  8:51 AM  Result Value Ref Range   WBC 13.8 (H) 4.0 - 10.5 K/uL   RBC 3.85 (L) 3.87 - 5.11 MIL/uL   Hemoglobin 10.8 (L) 12.0 - 15.0 g/dL   HCT 32.6 (L) 36.0 - 46.0 %   MCV 84.7 80.0 - 100.0 fL   MCH 28.1 26.0 - 34.0 pg   MCHC 33.1 30.0 - 36.0 g/dL   RDW 14.8 11.5 - 15.5 %   Platelets 377 150 - 400 K/uL   nRBC 0.0 0.0 - 0.2 %  Type and screen     Status: None   Collection Time: 01/26/19  8:51 AM  Result Value Ref Range   ABO/RH(D) A NEG    Antibody Screen NEG    Sample Expiration      01/29/2019,2359 Performed at [facility], [address] [place], [place] [address]     Assessment:   28 y.o. [id number] [redacted]w[redacted]d elective IOL  Plan:   1) Labor - continue to titrate pitocin  2) Fetus - cat I tracing  Malachy Mood, MD, [place] 01/26/2019, 11:57 PM

## 2019-01-27 LAB — CBC
HCT: 30.4 % — ABNORMAL LOW (ref 36.0–46.0)
Hemoglobin: 9.6 g/dL — ABNORMAL LOW (ref 12.0–15.0)
MCH: 27.1 pg (ref 26.0–34.0)
MCHC: 31.6 g/dL (ref 30.0–36.0)
MCV: 85.9 fL (ref 80.0–100.0)
Platelets: 355 10*3/uL (ref 150–400)
RBC: 3.54 MIL/uL — ABNORMAL LOW (ref 3.87–5.11)
RDW: 14.6 % (ref 11.5–15.5)
WBC: 20 10*3/uL — ABNORMAL HIGH (ref 4.0–10.5)
nRBC: 0 % (ref 0.0–0.2)

## 2019-01-27 LAB — RPR: RPR Ser Ql: NONREACTIVE

## 2019-01-27 MED ORDER — COCONUT OIL OIL: 1.0000 "application " | TOPICAL_OIL | Status: DC | PRN

## 2019-01-27 MED ORDER — IBUPROFEN 600 MG PO TABS
600.0000 mg | ORAL_TABLET | Freq: Four times a day (QID) | ORAL | Status: DC
  Administered 2019-01-27 (×2): 600 mg via ORAL
  Filled 2019-01-27 (×3): qty 1

## 2019-01-27 MED ORDER — ONDANSETRON HCL 4 MG/2ML IJ SOLN: 4.0000 mg | INTRAMUSCULAR | Status: DC | PRN

## 2019-01-27 MED ORDER — OXYCODONE-ACETAMINOPHEN 5-325 MG PO TABS: 1.0000 | ORAL_TABLET | ORAL | Status: DC | PRN

## 2019-01-27 MED ORDER — PRENATAL MULTIVITAMIN CH
1.0000 | ORAL_TABLET | Freq: Every day | ORAL | Status: DC
  Administered 2019-01-27: 13:00:00 1 via ORAL
  Filled 2019-01-27: qty 1

## 2019-01-27 MED ORDER — OXYTOCIN 40 UNITS IN NORMAL SALINE INFUSION - SIMPLE MED
INTRAVENOUS | Status: AC
  Filled 2019-01-27: qty 1000

## 2019-01-27 MED ORDER — DIBUCAINE (PERIANAL) 1 % EX OINT: 1.0000 "application " | TOPICAL_OINTMENT | CUTANEOUS | Status: DC | PRN

## 2019-01-27 MED ORDER — EPHEDRINE 5 MG/ML INJ: 10.0000 mg | INTRAVENOUS | Status: DC | PRN

## 2019-01-27 MED ORDER — SENNOSIDES-DOCUSATE SODIUM 8.6-50 MG PO TABS
2.0000 | ORAL_TABLET | ORAL | Status: DC
  Filled 2019-01-27: qty 2

## 2019-01-27 MED ORDER — SIMETHICONE 80 MG PO CHEW: 80.0000 mg | CHEWABLE_TABLET | ORAL | Status: DC | PRN

## 2019-01-27 MED ORDER — BENZOCAINE-MENTHOL 20-0.5 % EX AERO: 1.0000 "application " | INHALATION_SPRAY | CUTANEOUS | Status: DC | PRN

## 2019-01-27 MED ORDER — WITCH HAZEL-GLYCERIN EX PADS: 1.0000 "application " | MEDICATED_PAD | CUTANEOUS | Status: DC | PRN

## 2019-01-27 MED ORDER — ACETAMINOPHEN 325 MG PO TABS: 650.0000 mg | ORAL_TABLET | ORAL | Status: DC | PRN

## 2019-01-27 MED ORDER — ONDANSETRON HCL 4 MG PO TABS: 4.0000 mg | ORAL_TABLET | ORAL | Status: DC | PRN

## 2019-01-27 MED ORDER — OXYCODONE-ACETAMINOPHEN 5-325 MG PO TABS: 2.0000 | ORAL_TABLET | ORAL | Status: DC | PRN

## 2019-01-27 MED ORDER — DIPHENHYDRAMINE HCL 25 MG PO CAPS: 25.0000 mg | ORAL_CAPSULE | Freq: Four times a day (QID) | ORAL | Status: DC | PRN

## 2019-01-27 NOTE — Progress Notes (Signed)
Post Partum Day 1 Subjective: no complaints, up ad lib, voiding, tolerating PO and and has had a BM  Objective: Blood pressure 117/66, pulse (!) 101, temperature 98 F (36.7 C), temperature source Oral, resp. rate 20, height 5\' 7"  (1.702 m), weight 103.4 kg, last menstrual period 04/24/2018, SpO2 99 %,  currently breastfeeding.  Physical Exam:  General: alert, cooperative and no distress Lochia: appropriate Uterine Fundus: firm/ U-1/ML/NT } DVT Evaluation: No evidence of DVT seen on physical exam.  Recent Labs    01/26/19 0851 01/27/19 0652  HGB 10.8* 9.6*  HCT 32.6* 30.4*  WBC 13.8* 20.0*  PLT 377 355    Assessment/Plan: Stable PPD #0 Chronic anemia worsened with acute blood loss-asymptomatic and hemodynamically stable  Continue vitamins and iron Mother A negative and baby A positive-Rhogam candidate RI/VI/TDAP UTD Breast/ Depo Probable discharge tomorrow   LOS: 1 day   Dalia Heading 01/27/2019, 6:15 PM

## 2019-01-27 NOTE — Discharge Summary (Signed)
Obstetric Discharge Summary Reason for Admission: induction of labor Prenatal Procedures: none Intrapartum Procedures: spontaneous vaginal delivery Postpartum Procedures: Rho(D) Ig Complications-Operative and Postpartum: none Hemoglobin  Date Value Ref Range Status  01/27/2019 9.6 (L) 12.0 - 15.0 g/dL Final  10/28/2018 11.1 11.1 - 15.9 g/dL Final   HCT  Date Value Ref Range Status  01/27/2019 30.4 (L) 36.0 - 46.0 % Final   Hematocrit  Date Value Ref Range Status  10/28/2018 33.7 (L) 34.0 - 46.6 % Final    Physical Exam:  General: alert, cooperative and no distress Lochia: appropriate Uterine Fundus: firm DVT Evaluation: No evidence of DVT seen on physical exam. No cords or calf tenderness. No significant calf/ankle edema.  Discharge Diagnoses: Term Pregnancy-delivered  Discharge Information: Date: 01/28/2019 Activity: pelvic rest Diet: routine Allergies as of 01/28/2019   No Known Allergies     Medication List    STOP taking these medications   B-12 PO     TAKE these medications   acetaminophen 325 MG tablet Commonly known as: Tylenol Take 2 tablets (650 mg total) by mouth every 4 (four) hours as needed (for pain scale < 4).   albuterol 108 (90 Base) MCG/ACT inhaler Commonly known as: VENTOLIN HFA Inhale 2-4 puffs by mouth every 4 hours as needed for wheezing, cough, and/or shortness of breath   cetirizine 10 MG tablet Commonly known as: ZYRTEC Take 10 mg by mouth daily.   ibuprofen 600 MG tablet Commonly known as: ADVIL Take 1 tablet (600 mg total) by mouth every 6 (six) hours.   Prenatal Gummies/DHA & FA 0.4-32.5 MG Chew Chew by mouth.            Discharge Care Instructions  (From admission, onward)         Start     Ordered   01/28/19 0000  Discharge wound care:    Comments: Perform wound care instructions   01/28/19 0946          Condition: stable Discharge to: home Follow-up Information    Malachy Mood, MD Follow up in 6  week(s).   Specialty: Obstetrics and Gynecology Why: 6 weeks postpartum visit Contact information: [address] [place] [address] [phone]           Newborn Data: Live born child  Birth Weight:   APGAR: 68, 9  Newborn Delivery   Birth date/time: 01/27/2019 03:35:00 Delivery type: Vaginal, Spontaneous      Home with mother.  Prentice Docker, MD 01/28/2019, 9:48 AM

## 2019-01-27 NOTE — Progress Notes (Signed)
   Subjective:  Comfortable   Objective:   Vitals: Blood pressure 112/67, pulse 88, temperature 98 F (36.7 C), temperature source Oral, resp. rate 20, height 5\' 7"  (1.702 m), weight 103.4 kg, last menstrual period 04/24/2018, SpO2 99 %. General: NAD Abdomen: gravid, non-tender Cervical Exam:  Dilation: 6 Effacement (%): 90 Cervical Position: Middle Station: 0 Presentation: Vertex Exam by:: Steabler MD  FHT: 120, moderate, +accels, no decels Toco: 3-41min  Results for orders placed or performed during the hospital encounter of 01/26/19 (from the past 24 hour(s))  CBC     Status: Abnormal   Collection Time: 01/26/19  8:51 AM  Result Value Ref Range   WBC 13.8 (H) 4.0 - 10.5 K/uL   RBC 3.85 (L) 3.87 - 5.11 MIL/uL   Hemoglobin 10.8 (L) 12.0 - 15.0 g/dL   HCT 32.6 (L) 36.0 - 46.0 %   MCV 84.7 80.0 - 100.0 fL   MCH 28.1 26.0 - 34.0 pg   MCHC 33.1 30.0 - 36.0 g/dL   RDW 14.8 11.5 - 15.5 %   Platelets 377 150 - 400 K/uL   nRBC 0.0 0.0 - 0.2 %  Type and screen     Status: None   Collection Time: 01/26/19  8:51 AM  Result Value Ref Range   ABO/RH(D) A NEG    Antibody Screen NEG    Sample Expiration      01/29/2019,2359 Performed at [facility], [address], [place], [place] [address]     Assessment:   28 y.o. [phone] [redacted]w[redacted]d IOL elective  Plan:   1) Labor - continue to titrate up pitocin, contraction have spaced out somewhat currently on 12  2) Fetus - cat I tracing  Malachy Mood, MD, [facility] 01/27/2019, 2:01 AM

## 2019-01-27 NOTE — Lactation Note (Signed)
This note was copied from a baby's chart. Lactation Consultation Note  Patient Name: Jacqueline Fields [id number] Date: 01/27/2019 Reason for consult: Follow-up assessment   Maternal Data    Feeding Feeding Type: Breast Fed More aggressive with latch this feeding, needs much stimulation to suck, but is maintaining the latch LATCH Score Latch: Repeated attempts needed to sustain latch, nipple held in mouth throughout feeding, stimulation needed to elicit sucking reflex.  Audible Swallowing: A few with stimulation  Type of Nipple: Everted at rest and after stimulation  Comfort (Breast/Nipple): Soft / non-tender  Hold (Positioning): Assistance needed to correctly position infant at breast and maintain latch.  LATCH Score: 7  Interventions Interventions: Assisted with latch;Skin to skin;Breast compression;Adjust position;Position options  Lactation Tools Discussed/Used     Consult Status Consult Status: Follow-up Date: 01/28/19 Follow-up type: In-patient    Ferol Luz 01/27/2019, 4:28 PM

## 2019-01-28 LAB — FETAL SCREEN: Fetal Screen: NEGATIVE

## 2019-01-28 MED ORDER — IBUPROFEN 600 MG PO TABS: 600.0000 mg | ORAL_TABLET | Freq: Four times a day (QID) | ORAL | 0 refills | Status: DC

## 2019-01-28 MED ORDER — ACETAMINOPHEN 325 MG PO TABS: 650.0000 mg | ORAL_TABLET | ORAL | Status: DC | PRN

## 2019-01-28 MED ORDER — RHO D IMMUNE GLOBULIN 1500 UNIT/2ML IJ SOSY
300.0000 ug | PREFILLED_SYRINGE | Freq: Once | INTRAMUSCULAR | Status: AC
  Administered 2019-01-28: 300 ug via INTRAMUSCULAR
  Filled 2019-01-28: qty 2

## 2019-01-28 MED ORDER — IBUPROFEN 600 MG PO TABS
600.0000 mg | ORAL_TABLET | Freq: Four times a day (QID) | ORAL | Status: DC
  Filled 2019-01-28: qty 1

## 2019-01-28 NOTE — Progress Notes (Signed)
Pt discharged with infant.  Discharge instructions, prescriptions and follow up appointment given to and reviewed with pt. Pt verbalized understanding. Escorted out by staff. 

## 2019-01-29 LAB — RHOGAM INJECTION: Unit division: 0

## 2019-01-31 ENCOUNTER — Telehealth: Payer: Self-pay

## 2019-01-31 NOTE — Telephone Encounter (Signed)
Orlando Penner w/[employer] calling requesting a note stating that the patient delivered 01/27/2019 and the date she can return to work. [phone]. FMLA paperwork already received. They just need the return to work note. Patient able to authorize release of this info over phone if needed. [phone] Ext [phone]

## 2019-01-31 NOTE — Anesthesia Postprocedure Evaluation (Signed)
Anesthesia Post Note  Patient: Jacqueline Fields  Procedure(s) Performed: AN AD HOC LABOR EPIDURAL  Patient location during evaluation: Other Anesthesia Type: Epidural Level of consciousness: awake and alert and oriented Pain management: pain level controlled Vital Signs Assessment: post-procedure vital signs reviewed and stable Respiratory status: spontaneous breathing Cardiovascular status: blood pressure returned to baseline Anesthetic complications: no Comments: Patient discharged before visit.  No apparent anesthetic issues per nursing staff.     Last Vitals: There were no vitals filed for this visit.  Last Pain: There were no vitals filed for this visit.               [name]

## 2019-02-01 NOTE — Telephone Encounter (Signed)
Spoke w/Laura. Inquired if she was needing an authorized release date (6 wks postpartum) or the date the patient plans to return to work? She needs the date the patient is medically cleared (6 wks postpartum) on the work note. Note created and faxed.

## 2019-03-09 IMAGING — CT CT ANGIO CHEST
2 of 7 series · 19 of 46 positions shown · IV contrast (omnipaque)
Comparison: 03/01/2018

CLINICAL DATA: Acute hypoxic respiratory distress.

EXAM:
CT ANGIOGRAPHY CHEST WITH CONTRAST
TECHNIQUE: Multidetector CT imaging of the chest was performed using the
standard protocol during bolus administration of intravenous
contrast. Multiplanar CT image reconstructions and MIPs were
obtained to evaluate the vascular anatomy.
CONTRAST:  75mL OMNIPAQUE IOHEXOL 350 MG/ML SOLN

[Series 5: thins · axial · 0.64mm/px · z∈[-726,-448]mm · 16 of 312 slices shown]
[im 17/312  lung]
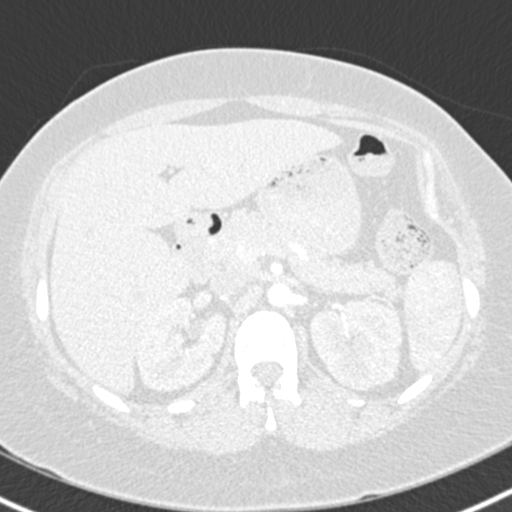
[im 33/312  soft-tissue]
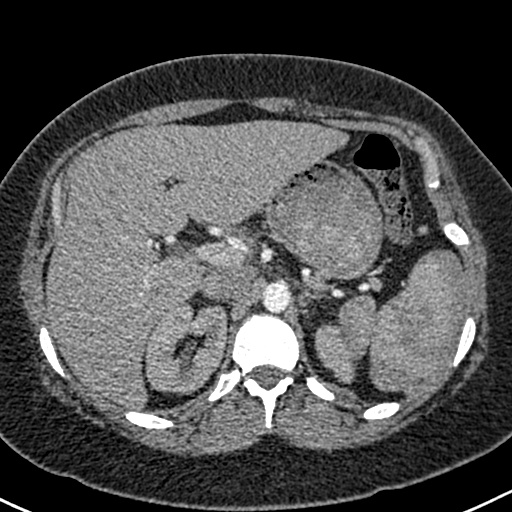
[im 50/312  lung]
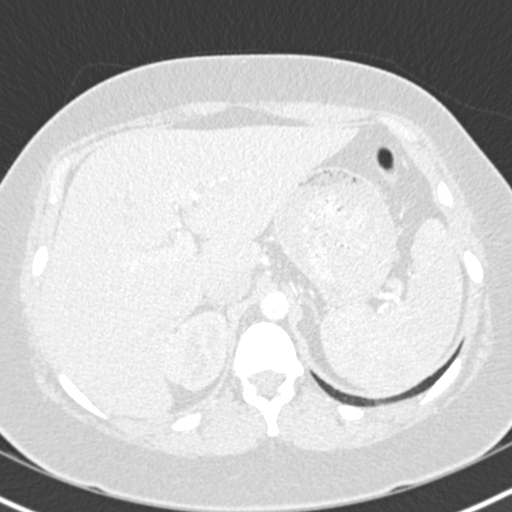
[im 66/312  soft-tissue]
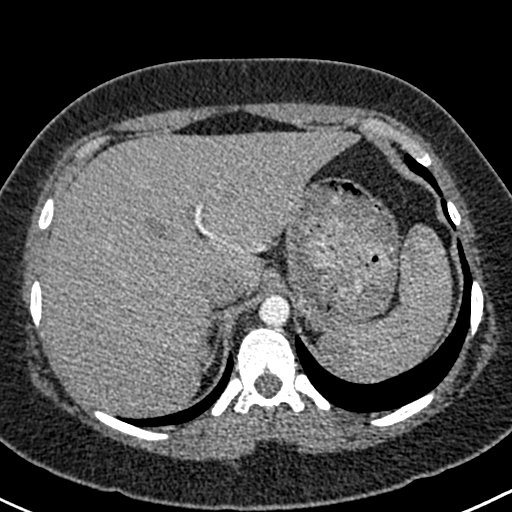
[im 99/312  lung]
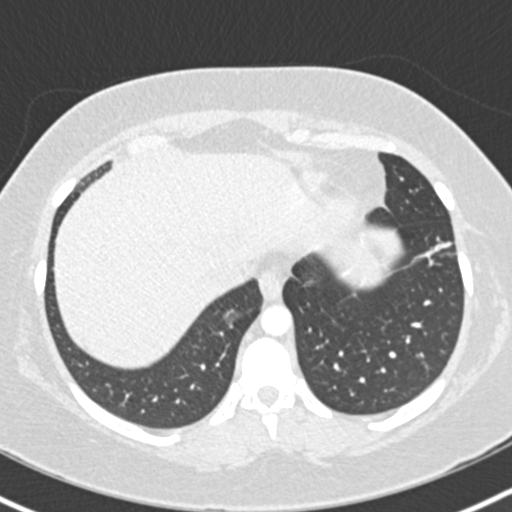
[im 115/312  soft-tissue]
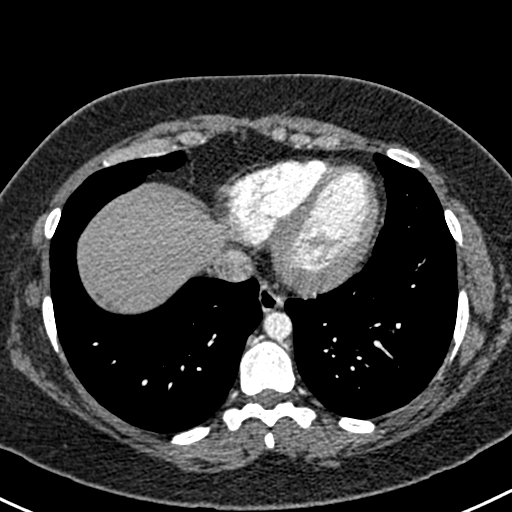
[im 131/312  lung]
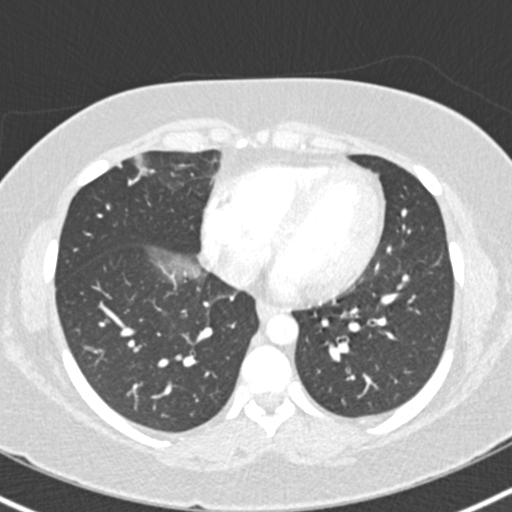
[im 148/312  soft-tissue]
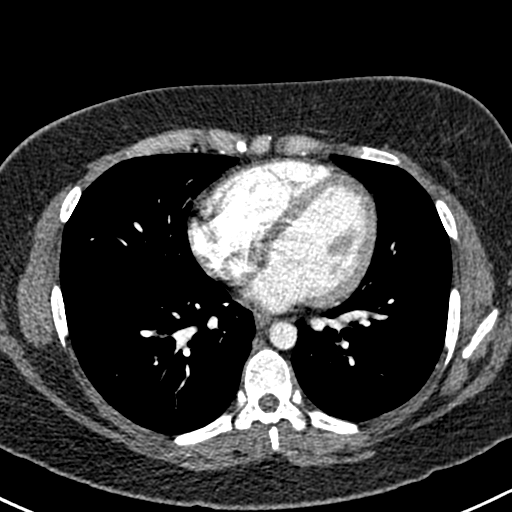
[im 164/312  lung]
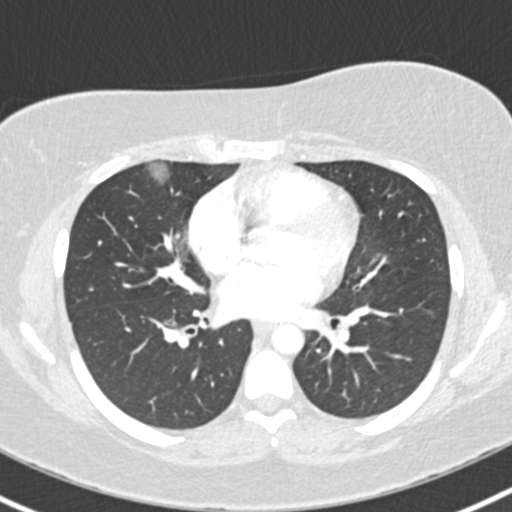
[im 181/312  soft-tissue]
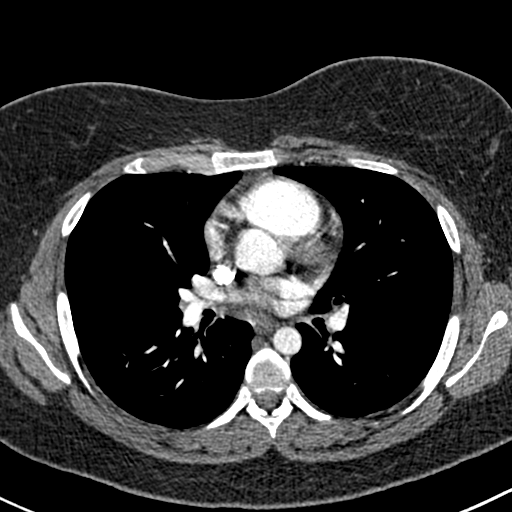
[im 197/312  lung]
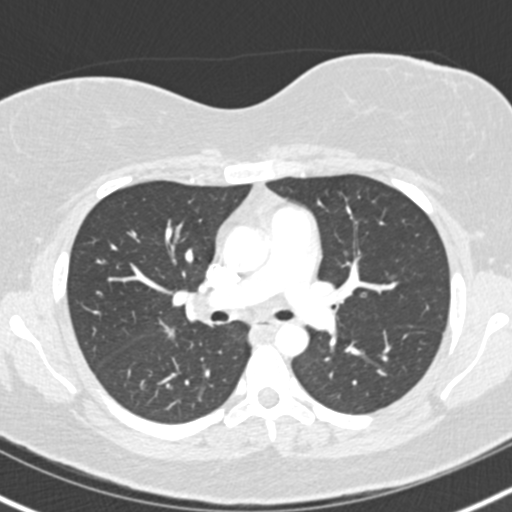
[im 213/312  soft-tissue]
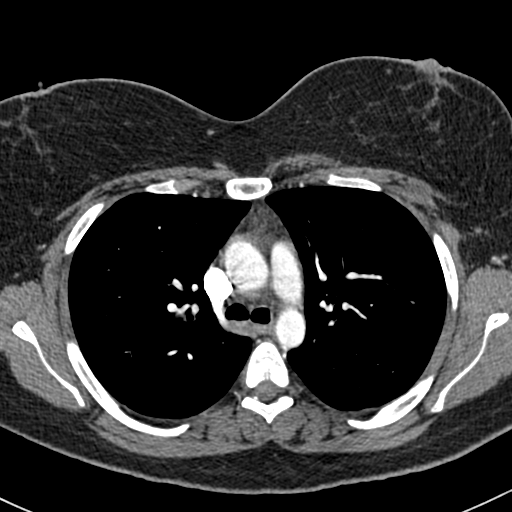
[im 246/312  lung]
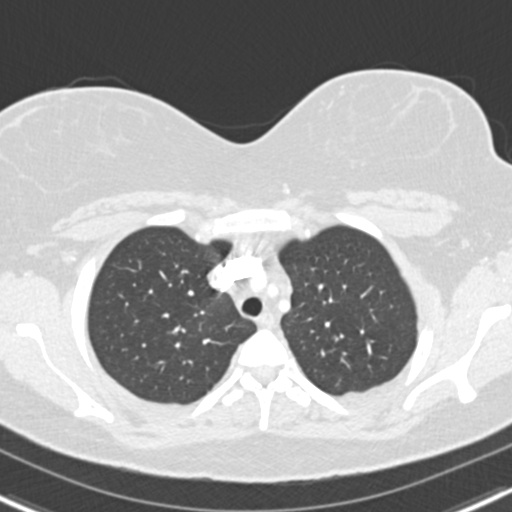
[im 262/312  soft-tissue]
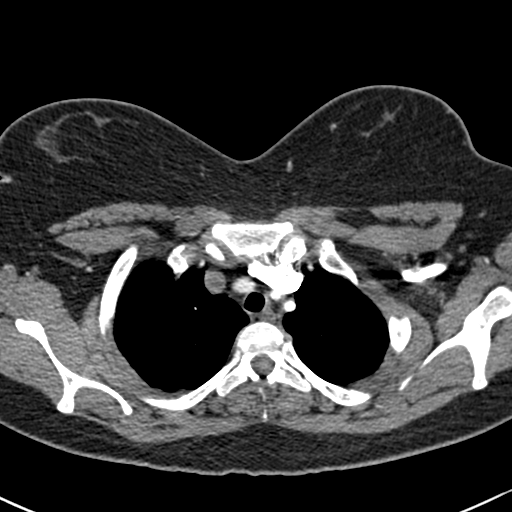
[im 279/312  lung]
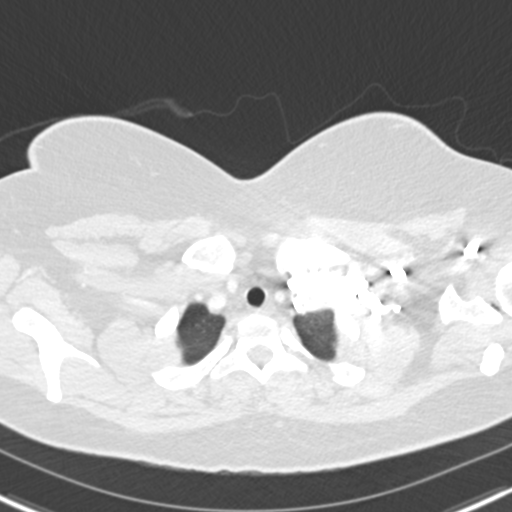
[im 295/312  soft-tissue]
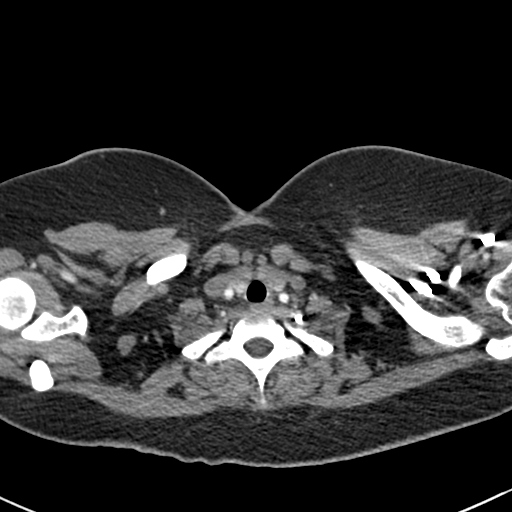

[Series 7: coronal mpr · coronal · 0.61mm/px · 3 of 77 slices shown]
[im 20/77  soft-tissue]
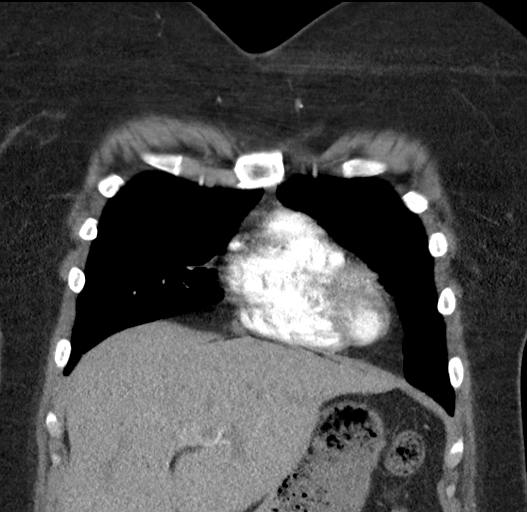
[im 39/77  soft-tissue]
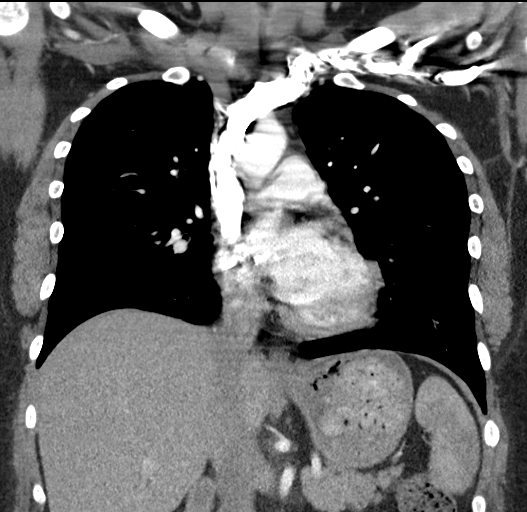
[im 58/77  soft-tissue]
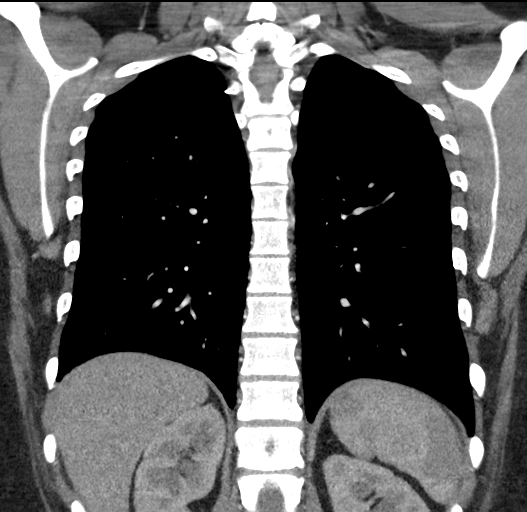

[19 of 46 positions shown; findings below may reference images not displayed]

FINDINGS: Cardiovascular: Satisfactory opacification of the pulmonary arteries
to the segmental level. No evidence of pulmonary embolism. Normal
heart size. No pericardial effusion.

Mediastinum/Nodes: No enlarged mediastinal, hilar, or axillary lymph
nodes. Thyroid gland, trachea, and esophagus demonstrate no
significant findings.

Lungs/Pleura: No focal consolidation, pleural effusion or
pneumothorax. Mild atelectasis in the left lower lobe. Ground-glass
opacity and airspace disease in the right middle lobe and right
lower lobe which may be secondary to an infectious or inflammatory
etiology.

Upper Abdomen: No acute abnormality.

Musculoskeletal: No chest wall abnormality. No acute or significant
osseous findings.

Review of the MIP images confirms the above findings.
IMPRESSION: 1. No evidence pulmonary embolus.
2. Right middle lobe and right lower lobe ground-glass opacity and
airspace disease which may be secondary to an infectious or
inflammatory etiology. No significant interval change compared with
03/01/2018.

## 2019-03-21 ENCOUNTER — Ambulatory Visit (INDEPENDENT_AMBULATORY_CARE_PROVIDER_SITE_OTHER): Payer: BC Managed Care – PPO | Admitting: Obstetrics and Gynecology

## 2019-03-21 ENCOUNTER — Other Ambulatory Visit: Payer: Self-pay

## 2019-03-21 ENCOUNTER — Encounter: Payer: Self-pay | Admitting: Obstetrics and Gynecology

## 2019-03-21 VITALS — BP 102/60 | HR 94 | Ht 68.0 in | Wt 207.0 lb

## 2019-03-21 DIAGNOSIS — Z3042 Encounter for surveillance of injectable contraceptive: Secondary | ICD-10-CM

## 2019-03-21 MED ORDER — MEDROXYPROGESTERONE ACETATE 150 MG/ML IM SUSP: 150.0000 mg | INTRAMUSCULAR | 3 refills | Status: DC

## 2019-03-21 MED ORDER — MEDROXYPROGESTERONE ACETATE 150 MG/ML IM SUSP
150.0000 mg | Freq: Once | INTRAMUSCULAR | Status: AC
  Administered 2019-03-21: 150 mg via INTRAMUSCULAR

## 2019-03-21 NOTE — Progress Notes (Signed)
Pt here for depo inj after having postpartum check today.  Pt is no longer bleeding from delivery, has not had a period, and has not had IC at all.  Depo given IM right glut.  NDC# [id number]

## 2019-03-21 NOTE — Addendum Note (Signed)
Addended by: Cleophas Dunker D on: 03/21/2019 03:36 PM   Modules accepted: Orders

## 2019-03-21 NOTE — Patient Instructions (Signed)
Medroxyprogesterone injection [Contraceptive] What is this medicine? MEDROXYPROGESTERONE (me DROX ee proe JES te rone) contraceptive injections prevent pregnancy. They provide effective birth control for 3 months. Depo-subQ Provera 104 is also used for treating pain related to endometriosis. This medicine may be used for other purposes; ask your health care provider or pharmacist if you have questions. COMMON BRAND NAME(S): Depo-Provera, Depo-subQ Provera 104 What should I tell my health care provider before I take this medicine? They need to know if you have any of these conditions:  frequently drink alcohol  asthma  blood vessel disease or a history of a blood clot in the lungs or legs  bone disease such as osteoporosis  breast cancer  diabetes  eating disorder (anorexia nervosa or bulimia)  high blood pressure  HIV infection or AIDS  kidney disease  liver disease  mental depression  migraine  seizures (convulsions)  stroke  tobacco smoker  vaginal bleeding  an unusual or allergic reaction to medroxyprogesterone, other hormones, medicines, foods, dyes, or preservatives  pregnant or trying to get pregnant  breast-feeding How should I use this medicine? Depo-Provera Contraceptive injection is given into a muscle. Depo-subQ Provera 104 injection is given under the skin. These injections are given by a health care professional. You must not be pregnant before getting an injection. The injection is usually given during the first 5 days after the start of a menstrual period or 6 weeks after delivery of a baby. Talk to your pediatrician regarding the use of this medicine in children. Special care may be needed. These injections have been used in female children who have started having menstrual periods. Overdosage: If you think you have taken too much of this medicine contact a poison control center or emergency room at once. NOTE: This medicine is only for you. Do not  share this medicine with others. What if I miss a dose? Try not to miss a dose. You must get an injection once every 3 months to maintain birth control. If you cannot keep an appointment, call and reschedule it. If you wait longer than 13 weeks between Depo-Provera contraceptive injections or longer than 14 weeks between Depo-subQ Provera 104 injections, you could get pregnant. Use another method for birth control if you miss your appointment. You may also need a pregnancy test before receiving another injection. What may interact with this medicine? Do not take this medicine with any of the following medications:  bosentan This medicine may also interact with the following medications:  aminoglutethimide  antibiotics or medicines for infections, especially rifampin, rifabutin, rifapentine, and griseofulvin  aprepitant  barbiturate medicines such as phenobarbital or primidone  bexarotene  carbamazepine  medicines for seizures like ethotoin, felbamate, oxcarbazepine, phenytoin, topiramate  modafinil  St. John's wort This list may not describe all possible interactions. Give your health care provider a list of all the medicines, herbs, non-prescription drugs, or dietary supplements you use. Also tell them if you smoke, drink alcohol, or use illegal drugs. Some items may interact with your medicine. What should I watch for while using this medicine? This drug does not protect you against HIV infection (AIDS) or other sexually transmitted diseases. Use of this product may cause you to lose calcium from your bones. Loss of calcium may cause weak bones (osteoporosis). Only use this product for more than 2 years if other forms of birth control are not right for you. The longer you use this product for birth control the more likely you will be at risk   for weak bones. Ask your health care professional how you can keep strong bones. You may have a change in bleeding pattern or irregular periods.  Many females stop having periods while taking this drug. If you have received your injections on time, your chance of being pregnant is very low. If you think you may be pregnant, see your health care professional as soon as possible. Tell your health care professional if you want to get pregnant within the next year. The effect of this medicine may last a long time after you get your last injection. What side effects may I notice from receiving this medicine? Side effects that you should report to your doctor or health care professional as soon as possible:  allergic reactions like skin rash, itching or hives, swelling of the face, lips, or tongue  breast tenderness or discharge  breathing problems  changes in vision  depression  feeling faint or lightheaded, falls  fever  pain in the abdomen, chest, groin, or leg  problems with balance, talking, walking  unusually weak or tired  yellowing of the eyes or skin Side effects that usually do not require medical attention (report to your doctor or health care professional if they continue or are bothersome):  acne  fluid retention and swelling  headache  irregular periods, spotting, or absent periods  temporary pain, itching, or skin reaction at site where injected  weight gain This list may not describe all possible side effects. Call your doctor for medical advice about side effects. You may report side effects to FDA at 1-800-FDA-1088. Where should I keep my medicine? This does not apply. The injection will be given to you by a health care professional. NOTE: This sheet is a summary. It may not cover all possible information. If you have questions about this medicine, talk to your doctor, pharmacist, or health care provider.  2020 Elsevier/Gold Standard (2008-04-20 18:37:56)  

## 2019-03-21 NOTE — Progress Notes (Signed)
  OBSTETRICS POSTPARTUM CLINIC PROGRESS NOTE  Subjective:     Jacqueline Fields is a 28 y.o. G5P3003 female who presents for a postpartum visit. She is 6 weeks postpartum following a Term pregnancy and delivery by Vaginal, no problems at delivery.  I have fully reviewed the prenatal and intrapartum course. Anesthesia: epidural.  Postpartum course has been complicated by uncomplicated.  Baby is feeding by Bottle.  Bleeding: patient has not  resumed menses.  Bowel function is normal. Bladder function is normal.  Patient is not sexually active. Contraception method desired is Depo-Provera injections.  Postpartum depression screening: negative. [place] 1.  The following portions of the patient's history were reviewed and updated as appropriate: allergies, current medications, past family history, past medical history, past social history, past surgical history and problem list.  Review of Systems Pertinent items are noted in HPI.  Objective:    BP 102/60   Pulse 94   Ht 5\' 8"  (1.727 m)   Wt 207 lb (93.9 kg)   LMP 04/24/2018 (Exact Date)   Breastfeeding No   BMI 31.47 kg/m   General:  alert and no distress   Breasts:  inspection negative, no nipple discharge or bleeding, no masses or nodularity palpable  Lungs: clear to auscultation bilaterally  Heart:  regular rate and rhythm, S1, S2 normal, no murmur, click, rub or gallop  Abdomen: soft, non-tender; bowel sounds normal; no masses,  no organomegaly.     Vulva:  normal  Vagina: normal vagina, no discharge, exudate, lesion, or erythema  Cervix:  no cervical motion tenderness and no lesions  Corpus: normal size, contour, position, consistency, mobility, non-tender  Adnexa:  normal adnexa and no mass, fullness, tenderness  Rectal Exam: Not performed.          Assessment:  Post Partum Care visit 1. Encounter for Depo-Provera contraception   - medroxyPROGESTERone (DEPO-PROVERA) 150 MG/ML injection; Inject 1 mL (150 mg total) into  the muscle every 3 (three) months.  Dispense: 1 mL; Refill: 3  2. Postpartum care and examination     Plan:  See orders and Patient Instructions Follow up in: 12 month or as needed.   Adrian Prows MD [facility] 03/21/2019 2:35 PM

## 2019-03-22 NOTE — Progress Notes (Signed)
Please disregard this encounter.

## 2019-05-25 IMAGING — CR DG CHEST 2V
1 series · 2 of 2 positions shown · non-contrast
Comparison: February 28, 2018

CLINICAL DATA: Cough.  Pain with inspiration.  Shortness of breath.

EXAM:
CHEST - 2 VIEW

[Series 1: dg chest 2 view · 0.14mm/px · 2 of 2 slices shown]
[im 1/2]
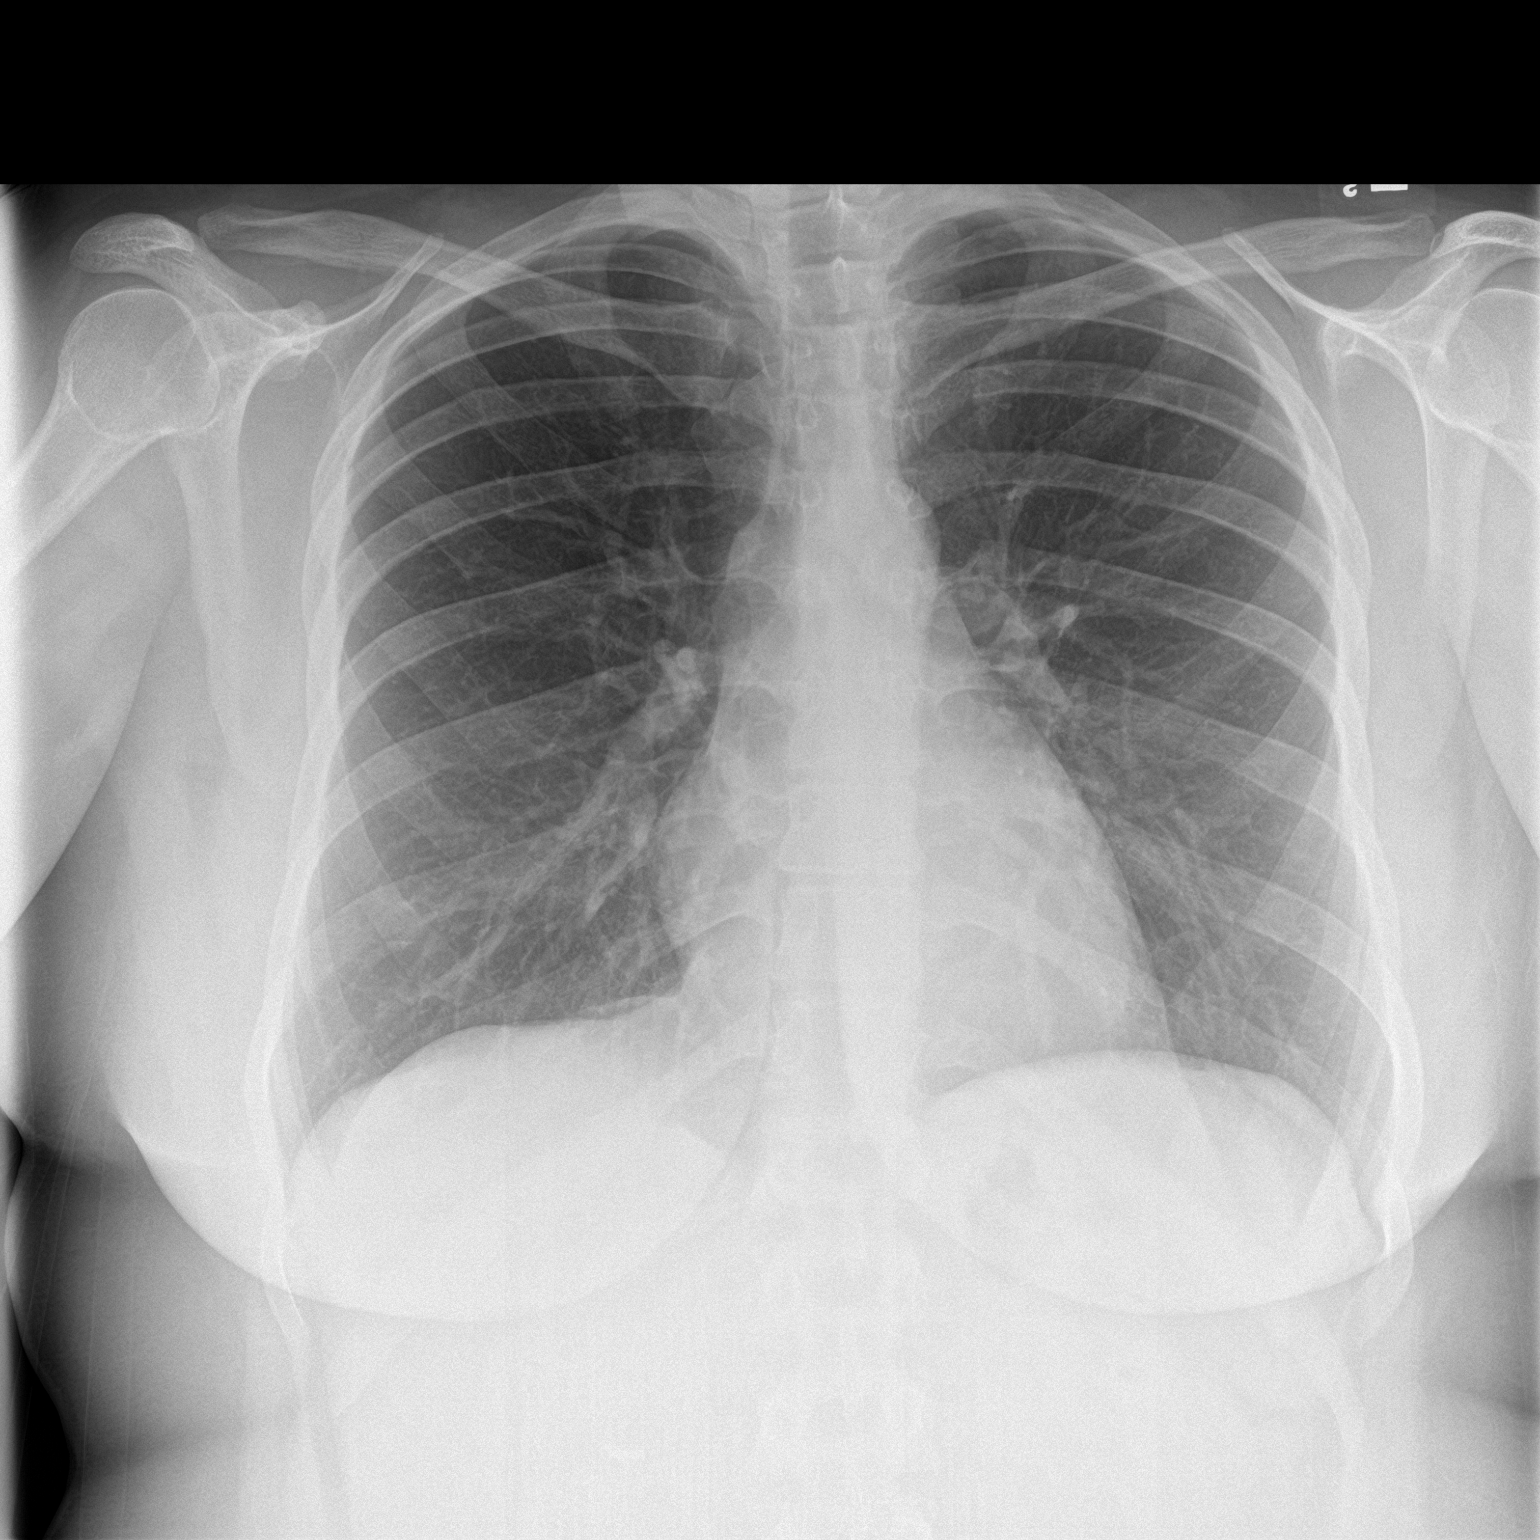
[im 2/2]
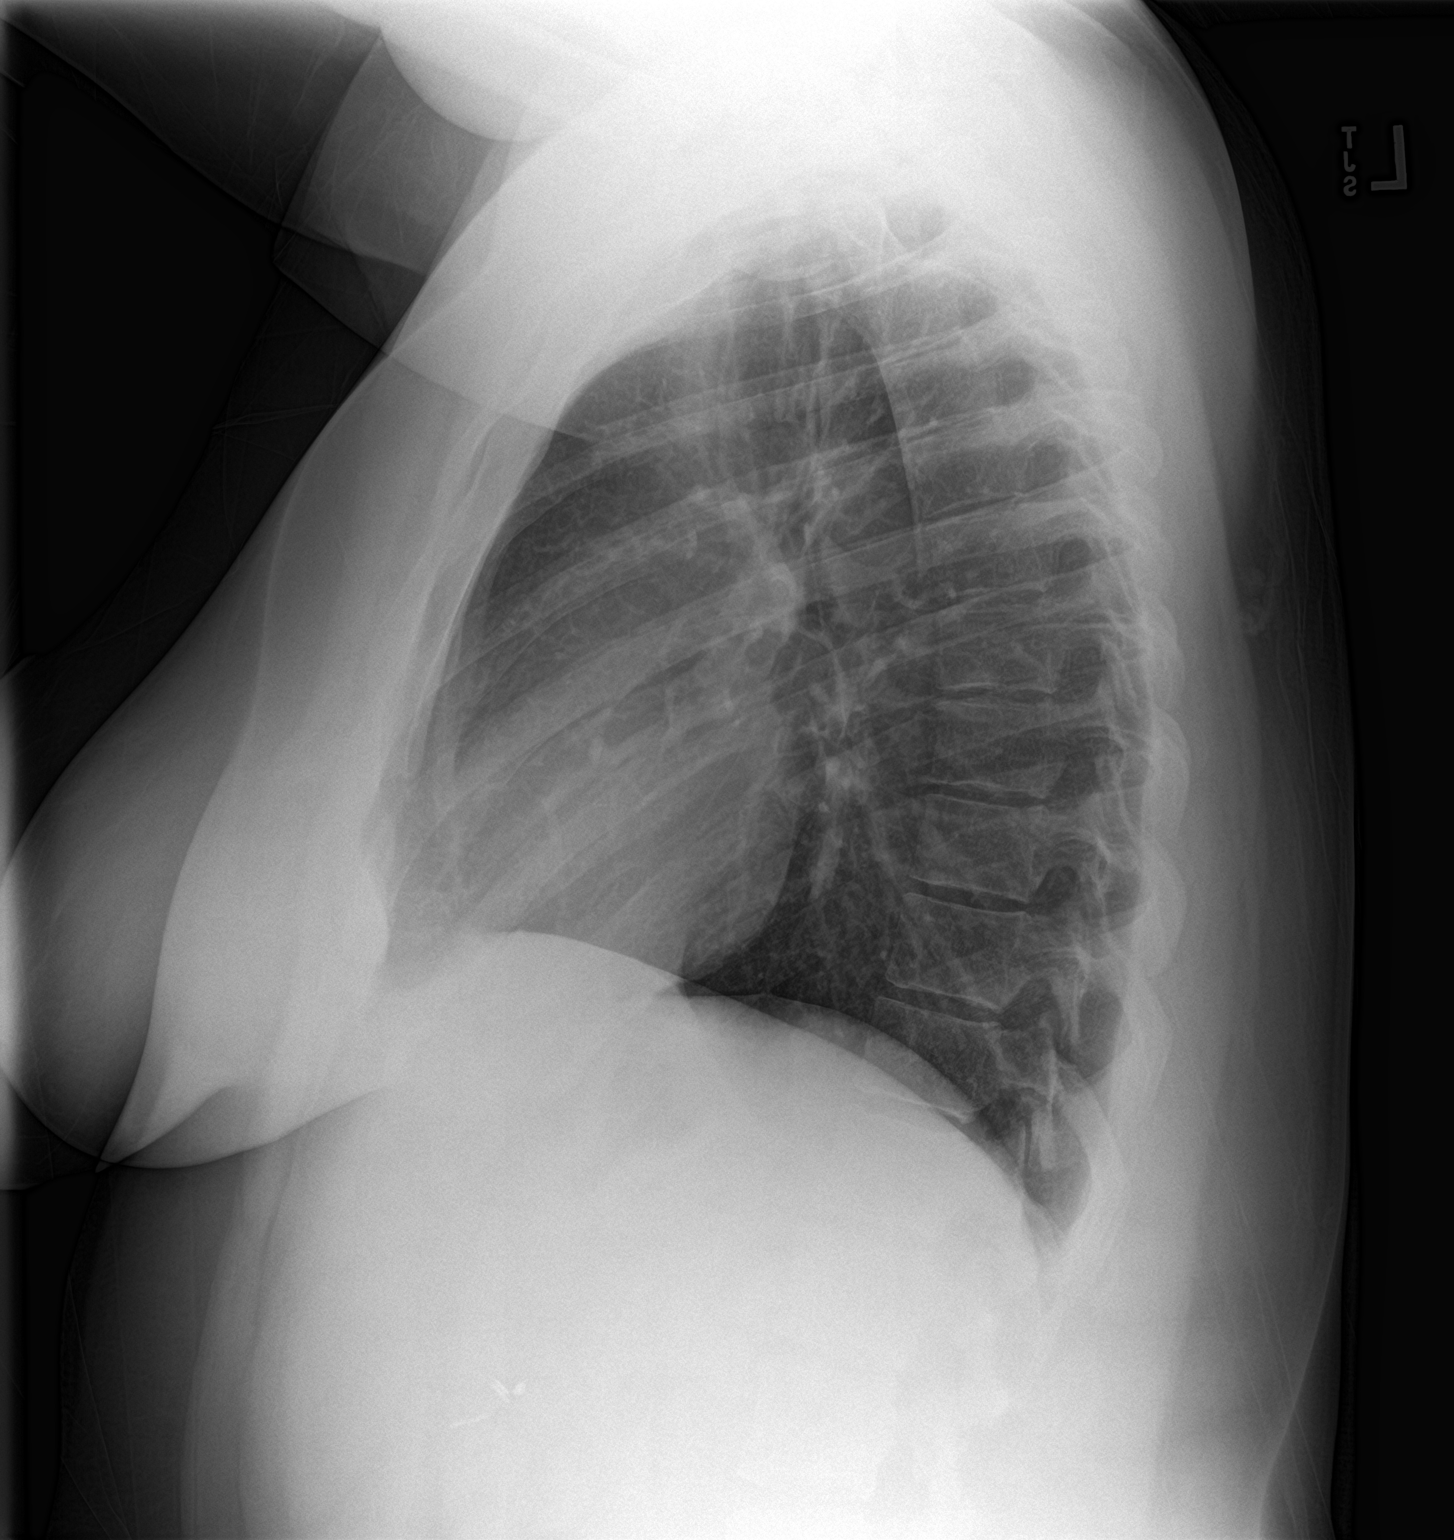

[2 of 2 positions shown; findings below may reference images not displayed]

FINDINGS: The heart size and mediastinal contours are within normal limits.
Both lungs are clear. The visualized skeletal structures are
unremarkable.
IMPRESSION: No active cardiopulmonary disease.

## 2019-06-13 ENCOUNTER — Other Ambulatory Visit: Payer: Self-pay

## 2019-06-13 ENCOUNTER — Ambulatory Visit (INDEPENDENT_AMBULATORY_CARE_PROVIDER_SITE_OTHER): Payer: BLUE CROSS/BLUE SHIELD

## 2019-06-13 DIAGNOSIS — Z3042 Encounter for surveillance of injectable contraceptive: Secondary | ICD-10-CM

## 2019-06-13 MED ORDER — MEDROXYPROGESTERONE ACETATE 150 MG/ML IM SUSP
150.0000 mg | Freq: Once | INTRAMUSCULAR | Status: AC
  Administered 2019-06-13: 150 mg via INTRAMUSCULAR

## 2019-06-13 NOTE — Progress Notes (Signed)
Patient presents today for Depo Provera injection within dates. Given IM LUOQ. Patient tolerated well. 

## 2019-07-03 DIAGNOSIS — Z20828 Contact with and (suspected) exposure to other viral communicable diseases: Secondary | ICD-10-CM | POA: Diagnosis not present

## 2019-07-31 DIAGNOSIS — Z03818 Encounter for observation for suspected exposure to other biological agents ruled out: Secondary | ICD-10-CM | POA: Diagnosis not present

## 2019-08-18 DIAGNOSIS — Z23 Encounter for immunization: Secondary | ICD-10-CM | POA: Diagnosis not present

## 2019-09-05 ENCOUNTER — Ambulatory Visit (INDEPENDENT_AMBULATORY_CARE_PROVIDER_SITE_OTHER): Payer: BLUE CROSS/BLUE SHIELD

## 2019-09-05 ENCOUNTER — Other Ambulatory Visit: Payer: Self-pay

## 2019-09-05 DIAGNOSIS — Z3042 Encounter for surveillance of injectable contraceptive: Secondary | ICD-10-CM

## 2019-09-05 MED ORDER — MEDROXYPROGESTERONE ACETATE 150 MG/ML IM SUSP
150.0000 mg | Freq: Once | INTRAMUSCULAR | Status: AC
  Administered 2019-09-05: 150 mg via INTRAMUSCULAR

## 2019-09-15 DIAGNOSIS — Z23 Encounter for immunization: Secondary | ICD-10-CM | POA: Diagnosis not present

## 2019-10-30 DIAGNOSIS — Z113 Encounter for screening for infections with a predominantly sexual mode of transmission: Secondary | ICD-10-CM | POA: Diagnosis not present

## 2019-10-30 DIAGNOSIS — D649 Anemia, unspecified: Secondary | ICD-10-CM | POA: Diagnosis not present

## 2019-10-30 DIAGNOSIS — Z124 Encounter for screening for malignant neoplasm of cervix: Secondary | ICD-10-CM | POA: Diagnosis not present

## 2019-10-30 DIAGNOSIS — Z114 Encounter for screening for human immunodeficiency virus [HIV]: Secondary | ICD-10-CM | POA: Diagnosis not present

## 2019-10-30 DIAGNOSIS — Z0000 Encounter for general adult medical examination without abnormal findings: Secondary | ICD-10-CM | POA: Diagnosis not present

## 2019-10-30 DIAGNOSIS — E538 Deficiency of other specified B group vitamins: Secondary | ICD-10-CM | POA: Diagnosis not present

## 2019-10-30 DIAGNOSIS — N760 Acute vaginitis: Secondary | ICD-10-CM | POA: Diagnosis not present

## 2019-10-30 DIAGNOSIS — N390 Urinary tract infection, site not specified: Secondary | ICD-10-CM | POA: Diagnosis not present

## 2019-10-30 DIAGNOSIS — R300 Dysuria: Secondary | ICD-10-CM | POA: Diagnosis not present

## 2019-11-28 ENCOUNTER — Other Ambulatory Visit: Payer: Self-pay

## 2019-11-28 ENCOUNTER — Ambulatory Visit (INDEPENDENT_AMBULATORY_CARE_PROVIDER_SITE_OTHER): Payer: BLUE CROSS/BLUE SHIELD

## 2019-11-28 DIAGNOSIS — Z3042 Encounter for surveillance of injectable contraceptive: Secondary | ICD-10-CM | POA: Diagnosis not present

## 2019-11-28 MED ORDER — MEDROXYPROGESTERONE ACETATE 150 MG/ML IM SUSP
150.0000 mg | Freq: Once | INTRAMUSCULAR | Status: AC
  Administered 2019-11-28: 150 mg via INTRAMUSCULAR

## 2019-11-28 NOTE — Progress Notes (Signed)
Pt here for depo inj which was given IM right glut.  NDC# 0110-0349-61

## 2019-11-29 DIAGNOSIS — F419 Anxiety disorder, unspecified: Secondary | ICD-10-CM | POA: Diagnosis not present

## 2019-11-29 DIAGNOSIS — F331 Major depressive disorder, recurrent, moderate: Secondary | ICD-10-CM | POA: Diagnosis not present

## 2020-01-10 DIAGNOSIS — F419 Anxiety disorder, unspecified: Secondary | ICD-10-CM | POA: Diagnosis not present

## 2020-01-10 DIAGNOSIS — F331 Major depressive disorder, recurrent, moderate: Secondary | ICD-10-CM | POA: Diagnosis not present

## 2020-02-19 ENCOUNTER — Other Ambulatory Visit: Payer: Self-pay | Admitting: Obstetrics and Gynecology

## 2020-02-19 DIAGNOSIS — Z3042 Encounter for surveillance of injectable contraceptive: Secondary | ICD-10-CM

## 2020-02-20 ENCOUNTER — Other Ambulatory Visit: Payer: Self-pay

## 2020-02-20 ENCOUNTER — Ambulatory Visit (INDEPENDENT_AMBULATORY_CARE_PROVIDER_SITE_OTHER): Payer: BLUE CROSS/BLUE SHIELD

## 2020-02-20 ENCOUNTER — Telehealth: Payer: Self-pay | Admitting: Obstetrics and Gynecology

## 2020-02-20 DIAGNOSIS — Z3042 Encounter for surveillance of injectable contraceptive: Secondary | ICD-10-CM

## 2020-02-20 MED ORDER — MEDROXYPROGESTERONE ACETATE 150 MG/ML IM SUSP
150.0000 mg | Freq: Once | INTRAMUSCULAR | Status: AC
  Administered 2020-02-20: 150 mg via INTRAMUSCULAR

## 2020-02-20 NOTE — Telephone Encounter (Signed)
Pt aware refill eRx'd. 

## 2020-02-20 NOTE — Telephone Encounter (Signed)
Patient is requesting refill on depo for today's visit. I tried to scheduled patient for her annual. Patient refused to scheduled annual but wanted to be scheduled for a consult with Dr. Glennon Mac. Patient is scheduled for 03/12/20 with [facility]. Would you send in a refill for her depo

## 2020-02-20 NOTE — Progress Notes (Signed)
Pt here for depo which was given IM left glut.  NDC# 6742-5525-89

## 2020-03-12 ENCOUNTER — Other Ambulatory Visit: Payer: Self-pay

## 2020-03-12 ENCOUNTER — Encounter: Payer: Self-pay | Admitting: Obstetrics and Gynecology

## 2020-03-12 ENCOUNTER — Ambulatory Visit (INDEPENDENT_AMBULATORY_CARE_PROVIDER_SITE_OTHER): Payer: BLUE CROSS/BLUE SHIELD | Admitting: Obstetrics and Gynecology

## 2020-03-12 VITALS — BP 122/74 | Ht 68.0 in | Wt 204.0 lb

## 2020-03-12 DIAGNOSIS — A6004 Herpesviral vulvovaginitis: Secondary | ICD-10-CM

## 2020-03-12 NOTE — Progress Notes (Signed)
Obstetrics & Gynecology Office Visit   Chief Complaint  Patient presents with  . Follow-up    pt DX with HSV and wants to know more about it.     History of Present Illness: 28 y.o. G83P3003 female who was diagnosed in July this year with HSV. She wishes to know more about it. She has had a lot of anxiety surrounding this diagnosis.  She presents to receive more information about this diagnosis.    Past Medical History:  Diagnosis Date  . Allergy   . Anemia   . Iron deficiency anemia 05/04/2018  . Pneumonia 02/2018,05/2018    Past Surgical History:  Procedure Laterality Date  . ADENOIDECTOMY    . CHOLECYSTECTOMY N/A 11/15/2015   Procedure: LAPAROSCOPIC CHOLECYSTECTOMY WITH INTRAOPERATIVE CHOLANGIOGRAM;  Surgeon: Clayburn Pert, MD;  Location: [facility] ORS;  Service: General;  Laterality: N/A;  . COLONOSCOPY WITH PROPOFOL N/A 05/11/2017   Procedure: COLONOSCOPY WITH PROPOFOL;  Surgeon: Jonathon Bellows, MD;  Location: [facility] ENDOSCOPY;  Service: Gastroenterology;  Laterality: N/A;  . ESOPHAGOGASTRODUODENOSCOPY (EGD) WITH PROPOFOL N/A 05/11/2017   Procedure: ESOPHAGOGASTRODUODENOSCOPY (EGD) WITH PROPOFOL;  Surgeon: Jonathon Bellows, MD;  Location: [facility] ENDOSCOPY;  Service: Gastroenterology;  Laterality: N/A;    Gynecologic History: No LMP recorded.  Obstetric History: [id number]  Family History  Problem Relation Age of Onset  . Diabetes Paternal Grandmother   . Cancer Maternal Grandfather        Pancreatic cancer, passed away froms stroke.  . Stroke Maternal Grandfather   . Pancreatic cancer Maternal Grandfather 35  . Heart attack Paternal Grandfather   . Lung cancer Maternal Aunt 71  . Non-Hodgkin's lymphoma Maternal Aunt 60  . Breast cancer Maternal Aunt 65  . Breast cancer Maternal Aunt   . Breast cancer Paternal Aunt   . Thyroid cancer Cousin   . Bladder Cancer Neg Hx   . Kidney cancer Neg Hx     Social History   Socioeconomic History  . Marital status: Single    Spouse name: Not  on file  . Number of children: 2  . Years of education: 22  . Highest education level: Some college, no degree  Occupational History  . Not on file  Tobacco Use  . Smoking status: Never Smoker  . Smokeless tobacco: Never Used  Vaping Use  . Vaping Use: Never used  Substance and Sexual Activity  . Alcohol use: No  . Drug use: No  . Sexual activity: Not Currently    Partners: Male    Birth control/protection: Injection    Comment: depo  Other Topics Concern  . Not on file  Social History Narrative  . Not on file   Social Determinants of Health   Financial Resource Strain:   . Difficulty of Paying Living Expenses: Not on file  Food Insecurity:   . Worried About Charity fundraiser in the Last Year: Not on file  . Ran Out of Food in the Last Year: Not on file  Transportation Needs:   . Lack of Transportation (Medical): Not on file  . Lack of Transportation (Non-Medical): Not on file  Physical Activity:   . Days of Exercise per Week: Not on file  . Minutes of Exercise per Session: Not on file  Stress:   . Feeling of Stress : Not on file  Social Connections:   . Frequency of Communication with Friends and Family: Not on file  . Frequency of Social Gatherings with Friends and Family: Not on file  .  Attends Religious Services: Not on file  . Active Member of Clubs or Organizations: Not on file  . Attends Archivist Meetings: Not on file  . Marital Status: Not on file  Intimate Partner Violence:   . Fear of Current or Ex-Partner: Not on file  . Emotionally Abused: Not on file  . Physically Abused: Not on file  . Sexually Abused: Not on file    No Known Allergies  Prior to Admission medications   Medication Sig Start Date End Date Taking? Authorizing Provider  albuterol (PROVENTIL HFA;VENTOLIN HFA) 108 (90 Base) MCG/ACT inhaler Inhale 2-4 puffs by mouth every 4 hours as needed for wheezing, cough, and/or shortness of breath 03/03/18  Yes Gladstone Lighter, MD    cetirizine (ZYRTEC) 10 MG tablet Take 10 mg by mouth daily.   Yes [provider]  medroxyPROGESTERone (DEPO-PROVERA) 150 MG/ML injection ADMINISTER 1 ML(150 MG) IN THE MUSCLE EVERY 3 MONTHS 02/20/20  Yes Malachy Mood, MD    Review of Systems  Constitutional: Negative.   HENT: Negative.   Eyes: Negative.   Respiratory: Negative.   Cardiovascular: Negative.   Gastrointestinal: Negative.   Genitourinary: Negative.   Musculoskeletal: Negative.   Skin: Negative.   Neurological: Negative.   Psychiatric/Behavioral: Negative.      Physical Exam BP 122/74   Ht 5\' 8"  (1.727 m)   Wt 204 lb (92.5 kg)   BMI 31.02 kg/m  No LMP recorded. Physical Exam Constitutional:      General: She is not in acute distress.    Appearance: Normal appearance.  HENT:     Head: Normocephalic and atraumatic.  Eyes:     General: No scleral icterus.    Conjunctiva/sclera: Conjunctivae normal.  Neurological:     General: No focal deficit present.     Mental Status: She is alert and oriented to person, place, and time.     Cranial Nerves: No cranial nerve deficit.  Psychiatric:        Mood and Affect: Mood normal.        Behavior: Behavior normal.        Judgment: Judgment normal.     Female chaperone present for pelvic and breast  portions of the physical exam  Assessment: 30 y.o. G90P3003 female here for  1. Herpes, vulvovaginitis      Plan: Problem List Items Addressed This Visit    None    Visit Diagnoses    Herpes, vulvovaginitis    -  Primary     Discussed her diagnosis and ramifications in detail.  Discussed transmission of virus and safe practices. We also discussed the ramifications of this disease on future relationships and pregnancy.  All questions answered. Written information provided.  A total of 25 minutes were spent face-to-face with the patient as well as preparation, review, communication, and documentation during this encounter.    Prentice Docker,  MD 03/12/2020 6:26 PM

## 2020-05-14 ENCOUNTER — Other Ambulatory Visit: Payer: Self-pay

## 2020-05-14 ENCOUNTER — Ambulatory Visit (INDEPENDENT_AMBULATORY_CARE_PROVIDER_SITE_OTHER): Payer: BLUE CROSS/BLUE SHIELD

## 2020-05-14 DIAGNOSIS — Z3042 Encounter for surveillance of injectable contraceptive: Secondary | ICD-10-CM | POA: Diagnosis not present

## 2020-05-14 MED ORDER — MEDROXYPROGESTERONE ACETATE 150 MG/ML IM SUSP
150.0000 mg | Freq: Once | INTRAMUSCULAR | Status: AC
  Administered 2020-05-14: 150 mg via INTRAMUSCULAR

## 2020-05-14 NOTE — Progress Notes (Signed)
Pt here for depo which was given IM right glut.  NDC# [id number] 

## 2020-07-15 ENCOUNTER — Telehealth: Payer: Self-pay

## 2020-07-15 NOTE — Telephone Encounter (Signed)
Patient rtn'd call to schedule laparoscopic bilateral salpingectomy w Kendra Opitz 5/19  H&P 5/11 @ 4:10   Covid testing 5/17 @ [place], [address]. Advised pt to quarantine until DOS.  Pre-admit phone call appointment to be requested - date and time will be included on H&P paper work. Also all appointments will be updated on pt MyChart. Explained that this appointment has a call window. Based on the time scheduled will indicate if the call will be received within a 4 hour window before 1:00 or after.  Advised that pt may also receive calls from the hospital pharmacy and pre-service center.  Confirmed pt has BCBS as Chartered certified accountant. No secondary insurance.

## 2020-07-15 NOTE — Telephone Encounter (Signed)
-----   Message from Will Bonnet, MD sent at 07/09/2020  2:50 PM EDT ----- Regarding: Schedule surgery Surgery Booking Request Patient Full Name:  Jacqueline Fields  MRN: [id number]  DOB: [birth year]  Surgeon: Prentice Docker, MD  Requested Surgery Date and Time: TBD Primary Diagnosis AND Code: Sterilization [Z30.2] Secondary Diagnosis and Code:  Surgical Procedure: Laparoscopic bilateral salpingectomy RNFA Requested?: No L&D Notification: No Admission Status: same day surgery Length of Surgery: 50 min Special Case Needs: No H&P: Yes Phone Interview???:  Yes Interpreter: No Medical Clearance:  No Special Scheduling Instructions: No Any known health/anesthesia issues, diabetes, sleep apnea, latex allergy, defibrillator/pacemaker?: No Acuity: P3   (P1 highest, P2 delay may cause harm, P3 low, elective gyn, P4 lowest)

## 2020-08-06 ENCOUNTER — Ambulatory Visit: Payer: BLUE CROSS/BLUE SHIELD

## 2020-08-20 ENCOUNTER — Encounter
Admission: RE | Admit: 2020-08-20 | Discharge: 2020-08-20 | Disposition: A | Discharge status: Home / Self Care | Payer: BLUE CROSS/BLUE SHIELD | Source: Ambulatory Visit | Attending: Obstetrics and Gynecology | Admitting: Obstetrics and Gynecology

## 2020-08-20 ENCOUNTER — Other Ambulatory Visit: Payer: Self-pay

## 2020-08-20 ENCOUNTER — Encounter: Payer: Self-pay | Admitting: *Deleted

## 2020-08-20 DIAGNOSIS — Z01818 Encounter for other preprocedural examination: Secondary | ICD-10-CM | POA: Insufficient documentation

## 2020-08-20 NOTE — Patient Instructions (Addendum)
INSTRUCTIONS FOR SURGERY     Your surgery is scheduled for:   Thursday, MAY 19TH     To find out your arrival time for the day of surgery,          please call [phone] between 1 pm and 3 pm on :  Wednesday, MAY 18TH     When you arrive for surgery, report to the [facility]. ONCE COMPLETED WITH THEM, GO TO THE SECOND FLOOR AND SIGN IN      AT THE SURGICAL DESK.    REMEMBER: Instructions that are not followed completely may result in serious medical risk,  up to and including death, or upon the discretion of your surgeon and anesthesiologist,            your surgery may need to be rescheduled.  __X__ 1. Do not eat food after midnight the night before your procedure.                    No gum, candy, lozenger, tic tacs, tums or hard candies.                  ABSOLUTELY NOTHING SOLID IN YOUR MOUTH AFTER MIDNIGHT                    You may drink unlimited clear liquids up to 2 hours before you are scheduled to arrive for surgery.                   Do not drink anything within those 2 hours unless you need to take medicine, then take the                   smallest amount you need.  Clear liquids include:  water, Verde juice without pulp,                   any flavor Gatorade, Black coffee, black tea.  Sugar may be added but no dairy/ honey /lemon.                        Broth and jello is not considered a clear liquid.  __x__  2. On the morning of surgery, please brush your teeth with toothpaste and water. You may rinse with                  mouthwash if you wish but DO NOT SWALLOW TOOTHPASTE OR MOUTHWASH  __X___3. NO alcohol for 24 hours before or after surgery.  __x___ 4.  Do NOT smoke or use e-cigarettes for 24 HOURS PRIOR TO SURGERY.                      DO NOT Use any chewable tobacco products for at least 6 hours prior to surgery.  __x___ 5. If you start any new medication  after this appointment and prior to surgery, please                   Bring it with you on the day  of surgery.  ___x__ 6. Notify your doctor if there is any change in your medical condition, such as fever,                  infection, vomitting, diarrhea or any open sores.  __x___ 7.  USE the CHG SOAP as instructed, the night before surgery and the day of surgery.                   Once you have washed with this soap, do NOT use any of the following: Powders, perfumes                    or lotions. Please do not wear make up, hairpins, clips or nail polish. You MAY wear deodorant.                                                      Women need to shave 48 hours prior to surgery.                   DO NOT wear ANY jewelry on the day of surgery. If there are rings that are too tight to                    remove easily, please address this prior to the surgery day. Piercings need to be removed.                                                                     NO METAL ON YOUR BODY.                    Do NOT bring any valuables.  If you came to Pre-Admit testing then you will not need license,                     insurance card or credit card.  If you will be staying overnight, please either leave your things in                     the car or have your family be responsible for these items.                     [facility] IS NOT RESPONSIBLE FOR BELONGINGS OR VALUABLES.  ___X__ 8. DO NOT wear contact lenses on surgery day.  You may not have dentures,                     Hearing aides, contacts or glasses in the operating room. These items can be                    Placed in the Recovery Room to receive immediately after surgery.  __x___ 9. IF YOU ARE SCHEDULED TO GO HOME ON THE SAME DAY, YOU MUST                   Have someone to drive you home and to stay with you  for  the first 24 hours.                    Have an arrangement prior to arriving on surgery day.  ___x__ 10. Take the following  medications on the morning of surgery with a sip of water:                              1. INHALER, IF NEEDED                     2.                     3.  _X____ 11.  Follow any instructions provided to you by your surgeon.                        Such as enema, clear liquid bowel prep                 ##PLEASE COMPLETE THE PRESURGICAL CARBOHYDRATE DRINK BY                     THE TIME YOU LEAVE FROM HOME ON SURGERY DAY.##  __X__  12. STOP ALL ASPIRIN PRODUCTS AS OF TODAY, MAY 10TH                       THIS INCLUDES BC POWDERS / GOODIES POWDER  __x___ 13. STOP Anti-inflammatories as of TODAY, MAY 10TH                      This includes IBUPROFEN / MOTRIN / ADVIL / ALEVE/ NAPROXYN                    YOU MAY TAKE TYLENOL ANY TIME PRIOR TO SURGERY.  __X___ 14.  Stop supplements until after surgery.                 __X____18.   Wear clean and comfortable clothing to the hospital.  [place]. IF YOU ARE USING NARCOTICS ON A REGULAR BASIS AFTER SURGERY, PLEASE   ADD A STOOL SOFTENER TO YOUR MEDICATION REGIME. BRING PHONE NUMBERS FOR YOUR CONTACTS.

## 2020-08-21 ENCOUNTER — Encounter: Payer: Self-pay | Admitting: Obstetrics and Gynecology

## 2020-08-21 ENCOUNTER — Ambulatory Visit (INDEPENDENT_AMBULATORY_CARE_PROVIDER_SITE_OTHER): Payer: BLUE CROSS/BLUE SHIELD | Admitting: Obstetrics and Gynecology

## 2020-08-21 VITALS — BP 126/84 | Ht 68.0 in | Wt 219.0 lb

## 2020-08-21 DIAGNOSIS — Z302 Encounter for sterilization: Secondary | ICD-10-CM

## 2020-08-21 NOTE — Progress Notes (Signed)
Preoperative History and Physical  Jacqueline Fields is a 30 y.o. G2X5284 here for surgical management of desire for permanent sterilization.   No significant preoperative concerns.  History of Present Illness: 30 y.o. G52P3003 female who presents for a pre-operative visit for permanent sterilization.  After discussion of multiple options, patient elects bilateral salpingectomy as method of contraception. She has previously been counseled about alternatives with similar or better efficacy that are not permanent.   Proposed surgery: laparoscopic bilateral salpingectomy  Past Medical History:  Diagnosis Date  . Allergy   . Anemia   . Iron deficiency anemia 05/04/2018  . Pneumonia 02/2018,05/2018   Past Surgical History:  Procedure Laterality Date  . ADENOIDECTOMY    . CHOLECYSTECTOMY N/A 11/15/2015   Procedure: LAPAROSCOPIC CHOLECYSTECTOMY WITH INTRAOPERATIVE CHOLANGIOGRAM;  Surgeon: Clayburn Pert, MD;  Location: [facility] ORS;  Service: General;  Laterality: N/A;  . COLONOSCOPY WITH PROPOFOL N/A 05/11/2017   Procedure: COLONOSCOPY WITH PROPOFOL;  Surgeon: Jonathon Bellows, MD;  Location: [facility] ENDOSCOPY;  Service: Gastroenterology;  Laterality: N/A;  . ESOPHAGOGASTRODUODENOSCOPY (EGD) WITH PROPOFOL N/A 05/11/2017   Procedure: ESOPHAGOGASTRODUODENOSCOPY (EGD) WITH PROPOFOL;  Surgeon: Jonathon Bellows, MD;  Location: [facility] ENDOSCOPY;  Service: Gastroenterology;  Laterality: N/A;  . nsvd     x 3   OB History  Gravida Para Term Preterm AB Living  3 3 3  0 0 3  SAB IAB Ectopic Multiple Live Births  0 0 0 0 3    # Outcome Date GA Lbr Len/2nd Weight Sex Delivery Anes PTL Lv  3 Term 01/27/19 [redacted]w[redacted]d / 00:22 6 lb 13 oz (3.09 kg) M Vag-Spont EPI  LIV  2 Term 07/10/15 [redacted]w[redacted]d 02:40 / 00:04 8 lb (3.629 kg) M Vag-Spont None  LIV  1 Term 09/06/13 [redacted]w[redacted]d  7 lb 13 oz (3.544 kg) M Vag-Spont   LIV  Patient denies any other pertinent gynecologic issues.   Current Outpatient Medications on File Prior to Visit  Medication Sig  Dispense Refill  . acetaminophen (TYLENOL) 500 MG tablet Take 500 mg by mouth every 6 (six) hours as needed.    Marland Kitchen albuterol (PROVENTIL HFA;VENTOLIN HFA) 108 (90 Base) MCG/ACT inhaler Inhale 2-4 puffs by mouth every 4 hours as needed for wheezing, cough, and/or shortness of breath (Patient taking differently: Inhale 2-4 puffs into the lungs every 4 (four) hours as needed for wheezing or shortness of breath (cough).) 1 Inhaler 1  . cetirizine (ZYRTEC) 10 MG tablet Take 10 mg by mouth daily.    . medroxyPROGESTERone (DEPO-PROVERA) 150 MG/ML injection ADMINISTER 1 ML(150 MG) IN THE MUSCLE EVERY 3 MONTHS (Patient not taking: Reported on 08/13/2020) 1 mL 3   No current facility-administered medications on file prior to visit.   No Known Allergies  Social History:   reports that she has never smoked. She has never used smokeless tobacco. She reports that she does not drink alcohol and does not use drugs.  Family History  Problem Relation Age of Onset  . Diabetes Paternal Grandmother   . Cancer Maternal Grandfather        Pancreatic cancer, passed away froms stroke.  . Stroke Maternal Grandfather   . Pancreatic cancer Maternal Grandfather 6  . Heart attack Paternal Grandfather   . Lung cancer Maternal Aunt 71  . Non-Hodgkin's lymphoma Maternal Aunt 60  . Breast cancer Maternal Aunt 65  . Breast cancer Maternal Aunt   . Breast cancer Paternal Aunt   . Thyroid cancer Cousin   . Bladder Cancer Neg Hx   .  Kidney cancer Neg Hx     Review of Systems: Noncontributory  PHYSICAL EXAM: Blood pressure 126/84, height 5\' 8"  (1.727 m), weight 219 lb (99.3 kg), not currently breastfeeding. CONSTITUTIONAL: Well-developed, well-nourished female in no acute distress.  HENT:  Normocephalic, atraumatic, External right and left ear normal. Oropharynx is clear and moist EYES: Conjunctivae and EOM are normal. Pupils are equal, round, and reactive to light. No scleral icterus.  NECK: Normal range of motion,  supple, no masses SKIN: Skin is warm and dry. No rash noted. Not diaphoretic. No erythema. No pallor. [place]: Alert and oriented to person, place, and time. Normal reflexes, muscle tone coordination. No cranial nerve deficit noted. PSYCHIATRIC: Normal mood and affect. Normal behavior. Normal judgment and thought content. CARDIOVASCULAR: Normal heart rate noted, regular rhythm RESPIRATORY: Effort and breath sounds normal, no problems with respiration noted ABDOMEN: Soft, nontender, nondistended. PELVIC: Deferred MUSCULOSKELETAL: Normal range of motion. No edema and no tenderness. 2+ distal pulses.  Labs: No results found for this or any previous visit (from the past 336 hour(s)).  Imaging Studies: No results found.  Assessment:   ICD-10-CM   1. Encounter for sterilization  Z30.2     Plan: Patient will undergo surgical management with the above surgery.   The risks of surgery were discussed in detail with the patient including but not limited to: bleeding which may require transfusion or reoperation; infection which may require antibiotics; injury to surrounding organs which may involve bowel, bladder, ureters ; need for additional procedures including laparoscopy or laparotomy; thromboembolic phenomenon, surgical site problems and other postoperative/anesthesia complications. Likelihood of success in alleviating the patient's condition was discussed. Routine postoperative instructions will be reviewed with the patient and her family in detail after surgery.  The patient concurred with the proposed plan, giving informed written consent for the surgery.    Preoperative prophylactic antibiotics, as indicated, and SCDs ordered on call to the OR.    30 y.o. [id number]  with undesired fertility, desires permanent sterilization.  Other reversible forms of contraception were discussed with patient; she declines all other modalities. Permanent nature of as well as associated risks of the procedure  discussed with patient including but not limited to: risk of regret, permanence of method, bleeding, infection, injury to surrounding organs and need for additional procedures.  Failure risk of 0.5-1% with increased risk of ectopic gestation if pregnancy occurs was also discussed with patient.     Prentice Docker, MD 08/21/2020 4:24 PM

## 2020-08-21 NOTE — H&P (View-Only) (Signed)
Preoperative History and Physical  Jacqueline Fields is a 30 y.o. [id number] here for surgical management of desire for permanent sterilization.   No significant preoperative concerns.  History of Present Illness: 30 y.o. G52P3003 female who presents for a pre-operative visit for permanent sterilization.  After discussion of multiple options, patient elects bilateral salpingectomy as method of contraception. She has previously been counseled about alternatives with similar or better efficacy that are not permanent.   Proposed surgery: laparoscopic bilateral salpingectomy  Past Medical History:  Diagnosis Date  . Allergy   . Anemia   . Iron deficiency anemia 05/04/2018  . Pneumonia 02/2018,05/2018   Past Surgical History:  Procedure Laterality Date  . ADENOIDECTOMY    . CHOLECYSTECTOMY N/A 11/15/2015   Procedure: LAPAROSCOPIC CHOLECYSTECTOMY WITH INTRAOPERATIVE CHOLANGIOGRAM;  Surgeon: Clayburn Pert, MD;  Location: [facility] ORS;  Service: General;  Laterality: N/A;  . COLONOSCOPY WITH PROPOFOL N/A 05/11/2017   Procedure: COLONOSCOPY WITH PROPOFOL;  Surgeon: Jonathon Bellows, MD;  Location: [facility] ENDOSCOPY;  Service: Gastroenterology;  Laterality: N/A;  . ESOPHAGOGASTRODUODENOSCOPY (EGD) WITH PROPOFOL N/A 05/11/2017   Procedure: ESOPHAGOGASTRODUODENOSCOPY (EGD) WITH PROPOFOL;  Surgeon: Jonathon Bellows, MD;  Location: [facility] ENDOSCOPY;  Service: Gastroenterology;  Laterality: N/A;  . nsvd     x 3   OB History  Gravida Para Term Preterm AB Living  3 3 3  0 0 3  SAB IAB Ectopic Multiple Live Births  0 0 0 0 3    # Outcome Date GA Lbr Len/2nd Weight Sex Delivery Anes PTL Lv  3 Term 01/27/19 [redacted]w[redacted]d / 00:22 6 lb 13 oz (3.09 kg) M Vag-Spont EPI  LIV  2 Term 07/10/15 [redacted]w[redacted]d 02:40 / 00:04 8 lb (3.629 kg) M Vag-Spont None  LIV  1 Term 09/06/13 [redacted]w[redacted]d  7 lb 13 oz (3.544 kg) M Vag-Spont   LIV  Patient denies any other pertinent gynecologic issues.   Current Outpatient Medications on File Prior to Visit  Medication Sig  Dispense Refill  . acetaminophen (TYLENOL) 500 MG tablet Take 500 mg by mouth every 6 (six) hours as needed.    Marland Kitchen albuterol (PROVENTIL HFA;VENTOLIN HFA) 108 (90 Base) MCG/ACT inhaler Inhale 2-4 puffs by mouth every 4 hours as needed for wheezing, cough, and/or shortness of breath (Patient taking differently: Inhale 2-4 puffs into the lungs every 4 (four) hours as needed for wheezing or shortness of breath (cough).) 1 Inhaler 1  . cetirizine (ZYRTEC) 10 MG tablet Take 10 mg by mouth daily.    . medroxyPROGESTERone (DEPO-PROVERA) 150 MG/ML injection ADMINISTER 1 ML(150 MG) IN THE MUSCLE EVERY 3 MONTHS (Patient not taking: Reported on 08/13/2020) 1 mL 3   No current facility-administered medications on file prior to visit.   No Known Allergies  Social History:   reports that she has never smoked. She has never used smokeless tobacco. She reports that she does not drink alcohol and does not use drugs.  Family History  Problem Relation Age of Onset  . Diabetes Paternal Grandmother   . Cancer Maternal Grandfather        Pancreatic cancer, passed away froms stroke.  . Stroke Maternal Grandfather   . Pancreatic cancer Maternal Grandfather 6  . Heart attack Paternal Grandfather   . Lung cancer Maternal Aunt 71  . Non-Hodgkin's lymphoma Maternal Aunt 60  . Breast cancer Maternal Aunt 65  . Breast cancer Maternal Aunt   . Breast cancer Paternal Aunt   . Thyroid cancer Cousin   . Bladder Cancer Neg Hx   .  Kidney cancer Neg Hx     Review of Systems: Noncontributory  PHYSICAL EXAM: Blood pressure 126/84, height 5\' 8"  (1.727 m), weight 219 lb (99.3 kg), not currently breastfeeding. CONSTITUTIONAL: Well-developed, well-nourished female in no acute distress.  HENT:  Normocephalic, atraumatic, External right and left ear normal. Oropharynx is clear and moist EYES: Conjunctivae and EOM are normal. Pupils are equal, round, and reactive to light. No scleral icterus.  NECK: Normal range of motion,  supple, no masses SKIN: Skin is warm and dry. No rash noted. Not diaphoretic. No erythema. No pallor. [place]: Alert and oriented to person, place, and time. Normal reflexes, muscle tone coordination. No cranial nerve deficit noted. PSYCHIATRIC: Normal mood and affect. Normal behavior. Normal judgment and thought content. CARDIOVASCULAR: Normal heart rate noted, regular rhythm RESPIRATORY: Effort and breath sounds normal, no problems with respiration noted ABDOMEN: Soft, nontender, nondistended. PELVIC: Deferred MUSCULOSKELETAL: Normal range of motion. No edema and no tenderness. 2+ distal pulses.  Labs: No results found for this or any previous visit (from the past 336 hour(s)).  Imaging Studies: No results found.  Assessment:   ICD-10-CM   1. Encounter for sterilization  Z30.2     Plan: Patient will undergo surgical management with the above surgery.   The risks of surgery were discussed in detail with the patient including but not limited to: bleeding which may require transfusion or reoperation; infection which may require antibiotics; injury to surrounding organs which may involve bowel, bladder, ureters ; need for additional procedures including laparoscopy or laparotomy; thromboembolic phenomenon, surgical site problems and other postoperative/anesthesia complications. Likelihood of success in alleviating the patient's condition was discussed. Routine postoperative instructions will be reviewed with the patient and her family in detail after surgery.  The patient concurred with the proposed plan, giving informed written consent for the surgery.    Preoperative prophylactic antibiotics, as indicated, and SCDs ordered on call to the OR.    30 y.o. [id number]  with undesired fertility, desires permanent sterilization.  Other reversible forms of contraception were discussed with patient; she declines all other modalities. Permanent nature of as well as associated risks of the procedure  discussed with patient including but not limited to: risk of regret, permanence of method, bleeding, infection, injury to surrounding organs and need for additional procedures.  Failure risk of 0.5-1% with increased risk of ectopic gestation if pregnancy occurs was also discussed with patient.     Prentice Docker, MD 08/21/2020 4:24 PM

## 2020-08-27 ENCOUNTER — Other Ambulatory Visit: Admission: RE | Admit: 2020-08-27 | Payer: Self-pay | Source: Ambulatory Visit

## 2020-08-29 ENCOUNTER — Ambulatory Visit: Payer: BLUE CROSS/BLUE SHIELD

## 2020-08-29 ENCOUNTER — Encounter
Admission: RE | Disposition: A | Discharge status: Home / Self Care | Payer: Self-pay | Source: Home / Self Care | Attending: Obstetrics and Gynecology

## 2020-08-29 ENCOUNTER — Ambulatory Visit
Admission: RE | Admit: 2020-08-29 | Discharge: 2020-08-29 | Disposition: A | Discharge status: Home / Self Care | Payer: BLUE CROSS/BLUE SHIELD | Attending: Obstetrics and Gynecology | Admitting: Obstetrics and Gynecology

## 2020-08-29 ENCOUNTER — Encounter: Payer: Self-pay | Admitting: Obstetrics and Gynecology

## 2020-08-29 ENCOUNTER — Other Ambulatory Visit: Payer: Self-pay

## 2020-08-29 DIAGNOSIS — Z302 Encounter for sterilization: Secondary | ICD-10-CM

## 2020-08-29 HISTORY — PX: LAPAROSCOPIC TUBAL LIGATION: SHX1937

## 2020-08-29 LAB — POCT PREGNANCY, URINE: Preg Test, Ur: NEGATIVE

## 2020-08-29 SURGERY — LIGATION, FALLOPIAN TUBE, LAPAROSCOPIC
Anesthesia: General | Laterality: Bilateral

## 2020-08-29 MED ORDER — CHLORHEXIDINE GLUCONATE 0.12 % MT SOLN
15.0000 mL | Freq: Once | OROMUCOSAL | Status: AC
  Administered 2020-08-29: 15 mL via OROMUCOSAL

## 2020-08-29 MED ORDER — FENTANYL CITRATE (PF) 100 MCG/2ML IJ SOLN
INTRAMUSCULAR | Status: DC | PRN
  Administered 2020-08-29 (×2): 50 ug via INTRAVENOUS

## 2020-08-29 MED ORDER — DEXAMETHASONE SODIUM PHOSPHATE 10 MG/ML IJ SOLN
INTRAMUSCULAR | Status: DC | PRN
  Administered 2020-08-29: 10 mg via INTRAVENOUS

## 2020-08-29 MED ORDER — HYDROCODONE-ACETAMINOPHEN 5-325 MG PO TABS: 1.0000 | ORAL_TABLET | Freq: Four times a day (QID) | ORAL | 0 refills | Status: AC | PRN

## 2020-08-29 MED ORDER — ACETAMINOPHEN 325 MG PO TABS: 325.0000 mg | ORAL_TABLET | ORAL | Status: DC | PRN

## 2020-08-29 MED ORDER — LACTATED RINGERS IV SOLN: INTRAVENOUS | Status: DC

## 2020-08-29 MED ORDER — DEXMEDETOMIDINE (PRECEDEX) IN NS 20 MCG/5ML (4 MCG/ML) IV SYRINGE
PREFILLED_SYRINGE | INTRAVENOUS | Status: DC | PRN
  Administered 2020-08-29: 4 ug via INTRAVENOUS
  Administered 2020-08-29 (×2): 8 ug via INTRAVENOUS

## 2020-08-29 MED ORDER — POVIDONE-IODINE 10 % EX SWAB
2.0000 "application " | Freq: Once | CUTANEOUS | Status: AC
  Administered 2020-08-29: 2 via TOPICAL

## 2020-08-29 MED ORDER — PROPOFOL 10 MG/ML IV BOLUS
INTRAVENOUS | Status: AC
  Filled 2020-08-29: qty 40

## 2020-08-29 MED ORDER — DEXAMETHASONE SODIUM PHOSPHATE 10 MG/ML IJ SOLN
INTRAMUSCULAR | Status: AC
  Filled 2020-08-29: qty 2

## 2020-08-29 MED ORDER — IBUPROFEN 600 MG PO TABS: 600.0000 mg | ORAL_TABLET | Freq: Four times a day (QID) | ORAL | 0 refills | Status: AC

## 2020-08-29 MED ORDER — ORAL CARE MOUTH RINSE: 15.0000 mL | Freq: Once | OROMUCOSAL | Status: AC

## 2020-08-29 MED ORDER — BUPIVACAINE HCL 0.5 % IJ SOLN
INTRAMUSCULAR | Status: DC | PRN
  Administered 2020-08-29: 10 mL

## 2020-08-29 MED ORDER — ROCURONIUM BROMIDE 100 MG/10ML IV SOLN
INTRAVENOUS | Status: DC | PRN
  Administered 2020-08-29: 40 mg via INTRAVENOUS

## 2020-08-29 MED ORDER — ONDANSETRON HCL 4 MG/2ML IJ SOLN
INTRAMUSCULAR | Status: AC
  Filled 2020-08-29: qty 4

## 2020-08-29 MED ORDER — PROMETHAZINE HCL 25 MG/ML IJ SOLN: 6.2500 mg | INTRAMUSCULAR | Status: DC | PRN

## 2020-08-29 MED ORDER — CHLORHEXIDINE GLUCONATE 0.12 % MT SOLN
OROMUCOSAL | Status: AC
  Filled 2020-08-29: qty 15

## 2020-08-29 MED ORDER — ACETAMINOPHEN 160 MG/5ML PO SOLN
325.0000 mg | ORAL | Status: DC | PRN
  Filled 2020-08-29: qty 20.3

## 2020-08-29 MED ORDER — MIDAZOLAM HCL 2 MG/2ML IJ SOLN
INTRAMUSCULAR | Status: DC | PRN
  Administered 2020-08-29: 2 mg via INTRAVENOUS

## 2020-08-29 MED ORDER — LIDOCAINE HCL (CARDIAC) PF 100 MG/5ML IV SOSY
PREFILLED_SYRINGE | INTRAVENOUS | Status: DC | PRN
  Administered 2020-08-29: 100 mg via INTRAVENOUS

## 2020-08-29 MED ORDER — KETOROLAC TROMETHAMINE 30 MG/ML IJ SOLN: 30.0000 mg | Freq: Once | INTRAMUSCULAR | Status: DC | PRN

## 2020-08-29 MED ORDER — HYDROCODONE-ACETAMINOPHEN 7.5-325 MG PO TABS
1.0000 | ORAL_TABLET | Freq: Once | ORAL | Status: DC | PRN
Start: 2020-08-29 — End: 2020-08-29

## 2020-08-29 MED ORDER — FENTANYL CITRATE (PF) 100 MCG/2ML IJ SOLN: 25.0000 ug | INTRAMUSCULAR | Status: DC | PRN

## 2020-08-29 MED ORDER — PHENYLEPHRINE HCL (PRESSORS) 10 MG/ML IV SOLN
INTRAVENOUS | Status: AC
  Filled 2020-08-29: qty 1

## 2020-08-29 MED ORDER — DEXMEDETOMIDINE (PRECEDEX) IN NS 20 MCG/5ML (4 MCG/ML) IV SYRINGE
PREFILLED_SYRINGE | INTRAVENOUS | Status: AC
  Filled 2020-08-29: qty 5

## 2020-08-29 MED ORDER — SUGAMMADEX SODIUM 500 MG/5ML IV SOLN
INTRAVENOUS | Status: AC
  Filled 2020-08-29: qty 5

## 2020-08-29 MED ORDER — LIDOCAINE HCL (PF) 2 % IJ SOLN
INTRAMUSCULAR | Status: AC
  Filled 2020-08-29: qty 10

## 2020-08-29 MED ORDER — FENTANYL CITRATE (PF) 100 MCG/2ML IJ SOLN
INTRAMUSCULAR | Status: AC
  Filled 2020-08-29: qty 2

## 2020-08-29 MED ORDER — SUGAMMADEX SODIUM 200 MG/2ML IV SOLN
INTRAVENOUS | Status: DC | PRN
  Administered 2020-08-29: 200 mg via INTRAVENOUS

## 2020-08-29 MED ORDER — PROPOFOL 10 MG/ML IV BOLUS
INTRAVENOUS | Status: DC | PRN
  Administered 2020-08-29: 180 mg via INTRAVENOUS

## 2020-08-29 MED ORDER — DROPERIDOL 2.5 MG/ML IJ SOLN
0.6250 mg | Freq: Once | INTRAMUSCULAR | Status: DC | PRN
  Filled 2020-08-29: qty 2

## 2020-08-29 MED ORDER — MIDAZOLAM HCL 2 MG/2ML IJ SOLN
INTRAMUSCULAR | Status: AC
  Filled 2020-08-29: qty 2

## 2020-08-29 MED ORDER — ONDANSETRON HCL 4 MG/2ML IJ SOLN
INTRAMUSCULAR | Status: DC | PRN
  Administered 2020-08-29: 4 mg via INTRAVENOUS

## 2020-08-29 MED ORDER — MEPERIDINE HCL 25 MG/ML IJ SOLN: 6.2500 mg | INTRAMUSCULAR | Status: DC | PRN

## 2020-08-29 SURGICAL SUPPLY — 40 items
ADH SKN CLS APL DERMABOND .7 (GAUZE/BANDAGES/DRESSINGS) ×1
APL PRP STRL LF DISP 70% ISPRP (MISCELLANEOUS) ×1
BAG DRN RND TRDRP ANRFLXCHMBR (UROLOGICAL SUPPLIES) ×1
BAG URINE DRAIN 2000ML AR STRL (UROLOGICAL SUPPLIES) ×2 IMPLANT
BLADE SURG SZ11 CARB STEEL (BLADE) ×2 IMPLANT
CATH FOLEY 2WAY  5CC 16FR (CATHETERS) ×1
CATH FOLEY 2WAY 5CC 16FR (CATHETERS) ×1
CATH URTH 16FR FL 2W BLN LF (CATHETERS) ×1 IMPLANT
CHLORAPREP W/TINT 26 (MISCELLANEOUS) ×2 IMPLANT
CLIP FILSHIE TUBAL LIGA STRL (Clip) ×2 IMPLANT
COVER WAND RF STERILE (DRAPES) ×2 IMPLANT
DERMABOND ADVANCED (GAUZE/BANDAGES/DRESSINGS) ×1
DERMABOND ADVANCED .7 DNX12 (GAUZE/BANDAGES/DRESSINGS) ×1 IMPLANT
DRAPE 3/4 80X56 (DRAPES) ×2 IMPLANT
DRAPE LEGGINS SURG 28X43 STRL (DRAPES) ×2 IMPLANT
DRAPE UNDER BUTTOCK W/FLU (DRAPES) ×2 IMPLANT
GLOVE SURG ENC MOIS LTX SZ7 (GLOVE) IMPLANT
GLOVE SURG UNDER POLY LF SZ7.5 (GLOVE) ×14 IMPLANT
GOWN STRL REUS W/ TWL LRG LVL3 (GOWN DISPOSABLE) ×4 IMPLANT
GOWN STRL REUS W/TWL LRG LVL3 (GOWN DISPOSABLE) ×8
GRASPER SUT TROCAR 14GX15 (MISCELLANEOUS) IMPLANT
KIT PINK PAD W/HEAD ARE REST (MISCELLANEOUS) ×2
KIT PINK PAD W/HEAD ARM REST (MISCELLANEOUS) ×1 IMPLANT
KIT TURNOVER CYSTO (KITS) ×2 IMPLANT
LABEL OR SOLS (LABEL) ×2 IMPLANT
LIGASURE VESSEL 5MM BLUNT TIP (ELECTROSURGICAL) ×2 IMPLANT
MANIFOLD NEPTUNE II (INSTRUMENTS) IMPLANT
NEEDLE HYPO 22GX1.5 SAFETY (NEEDLE) ×2 IMPLANT
NS IRRIG 500ML POUR BTL (IV SOLUTION) ×2 IMPLANT
PACK LAP CHOLECYSTECTOMY (MISCELLANEOUS) ×2 IMPLANT
PAD OB MATERNITY 4.3X12.25 (PERSONAL CARE ITEMS) ×2 IMPLANT
PAD PREP 24X41 OB/GYN DISP (PERSONAL CARE ITEMS) ×2 IMPLANT
SET TUBE SMOKE EVAC HIGH FLOW (TUBING) ×2 IMPLANT
SOL PREP PVP 2OZ (MISCELLANEOUS) ×2
SOLUTION PREP PVP 2OZ (MISCELLANEOUS) ×1 IMPLANT
SURGILUBE 2OZ TUBE FLIPTOP (MISCELLANEOUS) ×2 IMPLANT
SUT VIC AB 2-0 UR6 27 (SUTURE) ×2 IMPLANT
SUT VIC AB 4-0 FS2 27 (SUTURE) ×2 IMPLANT
TROCAR ENDO BLADELESS 11MM (ENDOMECHANICALS) IMPLANT
TROCAR XCEL NON-BLD 5MMX100MML (ENDOMECHANICALS) ×6 IMPLANT

## 2020-08-29 NOTE — Op Note (Signed)
  Operative Note    Name: Jacqueline Fields  Date of Service: 08/29/2020  DOB: 27-Dec-1990  MRN: [id number]   Pre-Operative Diagnosis:  Encounter for sterilization [Z30.2]  Post-Operative Diagnosis: Encounter for sterilization [Z30.2]  Procedures: Laparoscopic bilateral sallpingectomy  Primary Surgeon: Prentice Docker, MD   EBL: 3 mL   IVF: 1,000 mL   Urine output: 500 mL clear urine at end of procedure  Specimens: bilateral fallopian tubes  Drains: none  Complications: None   Disposition: PACU   Condition: Stable   Findings: Normal appearing uterus, fallopian tubes, and ovaries  Procedure Summary:  The patient was taken to the operating room where general anesthesia was administered and found to be adequate. She was placed in the dorsal supine lithotomy position in Aspinwall stirrups and prepped and draped in usual sterile fashion. After a timeout was called an indwelling catheter was placed in her bladder. A sponge-on-a-stick was placed in the vagina for uterine manipulation.   Attention was turned to the abdomen where after injection of local anesthetic, a 5 mm infraumbilical incision was made with the scalpel. Entry into the abdomen was obtained via Optiview trocar technique (a blunt entry technique with camera visualization through the obturator upon entry). Verification of entry into the abdomen was obtained using opening pressures. The abdomen was insufflated with CO2. The camera was introduced through the trocar with verification of atraumatic entry.  A 5 mm suprapubic port site was created under direct intra-abdominal camera visualization without difficulty.  A left lower quadrant 5 mm port site was created under direct intra-abdominal camera visualization without difficulty.  The left fallopian tube was grasped at the fimbriated end and using the 5 mm LigaSure device the mesosalpinx was cauterized and transected in a lateral to medial fashion.  The fallopian tube was removed  in its entirety through the 5 mm port.  The same procedure was carried out on the right fallopian tube without difficulty.  Hemostasis was noted along the transection line.  All instruments were removed.  The air was emptied of CO2 with the help of 5 deep breaths from anesthesia.  All trochars were removed.  Each incision site was closed with a 4-0 Vicryl subcutaneous stitch.  Each incision was then covered with surgical skin glue.  The sponge on a stick was removed from the vagina.  The catheter was removed from the bladder.  The vagina was swept digitally to verify that no instruments or sponges remained. b The patient tolerated the procedure well.  Sponge, lap, needle, and instrument counts were correct x 2.  VTE prophylaxis: SCDs. Antibiotic prophylaxis: none indicated and none given. She was awakened in the operating room and was taken to the PACU in stable condition.   Prentice Docker, MD 08/29/2020 8:30 AM

## 2020-08-29 NOTE — Transfer of Care (Signed)
Immediate Anesthesia Transfer of Care Note  Patient: Jacqueline Fields  Procedure(s) Performed: LAPAROSCOPIC TUBAL LIGATION via salpingectomy (Bilateral )  Patient Location: PACU  Anesthesia Type:General  Level of Consciousness: drowsy and patient cooperative  Airway & Oxygen Therapy: Patient Spontanous Breathing and Patient connected to face mask oxygen  Post-op Assessment: Report given to RN and Post -op Vital signs reviewed and stable  Post vital signs: Reviewed and stable  Last Vitals:  Vitals Value Taken Time  BP 109/64 08/29/20 0836  Temp    Pulse 92 08/29/20 0839  Resp 20 08/29/20 0839  SpO2 97 % 08/29/20 0839  Vitals shown include unvalidated device data.  Last Pain:  Vitals:   08/29/20 0615  TempSrc: Temporal  PainSc: 0-No pain         Complications: No complications documented.

## 2020-08-29 NOTE — Interval H&P Note (Signed)
History and Physical Interval Note:  08/29/2020 7:12 AM  Jacqueline Fields  has presented today for surgery, with the diagnosis of Sterilization.  The various methods of treatment have been discussed with the patient and family. After consideration of risks, benefits and other options for treatment, the patient has consented to  Procedure(s): LAPAROSCOPIC TUBAL LIGATION (Bilateral) via bilateral salpingectomy as a surgical intervention.  The patient's history has been reviewed, patient examined, no change in status, stable for surgery.  I have reviewed the patient's chart and labs.  Questions were answered to the patient's satisfaction.     Prentice Docker, MD, [facility], [facility] 08/29/2020 7:13 AM

## 2020-08-29 NOTE — Discharge Instructions (Signed)

## 2020-08-29 NOTE — Anesthesia Preprocedure Evaluation (Signed)
Anesthesia Evaluation  Patient identified by MRN, date of birth, ID band Patient awake    Reviewed: Allergy & Precautions, H&P , NPO status , reviewed documented beta blocker date and time   Airway Mallampati: III  TM Distance: >3 FB Neck ROM: full    Dental  (+) Teeth Intact   Pulmonary asthma , pneumonia, resolved,  Asthma exercise induced, only in Fall. None at present   Pulmonary exam normal        Cardiovascular Normal cardiovascular exam     Neuro/Psych    GI/Hepatic neg GERD  ,  Endo/Other    Renal/GU      Musculoskeletal   Abdominal   Peds  Hematology  (+) Blood dyscrasia, anemia ,   Anesthesia Other Findings Past Medical History: No date: Allergy No date: Anemia 05/04/2018: Iron deficiency anemia 02/2018,05/2018: Pneumonia Past Surgical History: No date: ADENOIDECTOMY 11/15/2015: CHOLECYSTECTOMY; N/A     Comment:  Procedure: LAPAROSCOPIC CHOLECYSTECTOMY WITH               INTRAOPERATIVE CHOLANGIOGRAM;  Surgeon: Clayburn Pert,               MD;  Location: [facility] ORS;  Service: General;  Laterality:               N/A; 05/11/2017: COLONOSCOPY WITH PROPOFOL; N/A     Comment:  Procedure: COLONOSCOPY WITH PROPOFOL;  Surgeon: Jonathon Bellows, MD;  Location: [facility] ENDOSCOPY;  Service:               Gastroenterology;  Laterality: N/A; 05/11/2017: ESOPHAGOGASTRODUODENOSCOPY (EGD) WITH PROPOFOL; N/A     Comment:  Procedure: ESOPHAGOGASTRODUODENOSCOPY (EGD) WITH               PROPOFOL;  Surgeon: Jonathon Bellows, MD;  Location: [facility]               ENDOSCOPY;  Service: Gastroenterology;  Laterality: N/A; No date: nsvd     Comment:  x 3 BMI    Body Mass Index: 31.93 kg/m     Reproductive/Obstetrics                             Anesthesia Physical Anesthesia Plan  ASA: II  Anesthesia Plan: General   Post-op Pain Management:    Induction: Intravenous  PONV Risk Score and Plan:  Ondansetron, Dexamethasone, Treatment may vary due to age or medical condition, Diphenhydramine and Midazolam  Airway Management Planned: Oral ETT  Additional Equipment:   Intra-op Plan:   Post-operative Plan: Extubation in OR  Informed Consent: I have reviewed the patients History and Physical, chart, labs and discussed the procedure including the risks, benefits and alternatives for the proposed anesthesia with the patient or authorized representative who has indicated his/her understanding and acceptance.     Dental Advisory Given  Plan Discussed with: CRNA  Anesthesia Plan Comments:         Anesthesia Quick Evaluation

## 2020-08-29 NOTE — Anesthesia Procedure Notes (Signed)
Procedure Name: Intubation Date/Time: 08/29/2020 7:35 AM Performed by: Demetrius Charity, CRNA Pre-anesthesia Checklist: Patient identified, Patient being monitored, Timeout performed, Emergency Drugs available and Suction available Patient Re-evaluated:Patient Re-evaluated prior to induction Oxygen Delivery Method: Circle system utilized Preoxygenation: Pre-oxygenation with 100% oxygen Induction Type: IV induction Ventilation: Mask ventilation without difficulty Laryngoscope Size: 3 and McGraph Grade View: Grade I Tube type: Oral Tube size: 6.5 mm Number of attempts: 1 Airway Equipment and Method: Stylet Placement Confirmation: ETT inserted through vocal cords under direct vision,  positive ETCO2 and breath sounds checked- equal and bilateral Secured at: 20 cm Tube secured with: Tape Dental Injury: Teeth and Oropharynx as per pre-operative assessment

## 2020-08-29 NOTE — Anesthesia Postprocedure Evaluation (Signed)
Anesthesia Post Note  Patient: Jacqueline Fields  Procedure(s) Performed: LAPAROSCOPIC TUBAL LIGATION via salpingectomy (Bilateral )  Patient location during evaluation: PACU Anesthesia Type: General Level of consciousness: awake and alert Pain management: pain level controlled Vital Signs Assessment: post-procedure vital signs reviewed and stable Respiratory status: spontaneous breathing, nonlabored ventilation and respiratory function stable Cardiovascular status: blood pressure returned to baseline and stable Postop Assessment: no apparent nausea or vomiting Anesthetic complications: no   No complications documented.   Last Vitals:  Vitals:   08/29/20 0925 08/29/20 0951  BP: 107/79 109/76  Pulse: 66 84  Resp: 16 20  Temp: (!) 36.1 C   SpO2: 99% 100%    Last Pain:  Vitals:   08/29/20 0951  TempSrc:   PainSc: 2                  Alphonsus Sias

## 2020-08-30 LAB — SURGICAL PATHOLOGY

## 2020-09-04 ENCOUNTER — Encounter: Payer: Self-pay | Admitting: Oncology

## 2020-09-16 ENCOUNTER — Ambulatory Visit (INDEPENDENT_AMBULATORY_CARE_PROVIDER_SITE_OTHER): Payer: BLUE CROSS/BLUE SHIELD | Admitting: Obstetrics and Gynecology

## 2020-09-16 ENCOUNTER — Encounter: Payer: Self-pay | Admitting: Obstetrics and Gynecology

## 2020-09-16 ENCOUNTER — Other Ambulatory Visit: Payer: Self-pay

## 2020-09-16 VITALS — BP 102/70 | Ht 68.0 in | Wt 220.0 lb

## 2020-09-16 DIAGNOSIS — Z09 Encounter for follow-up examination after completed treatment for conditions other than malignant neoplasm: Secondary | ICD-10-CM

## 2020-09-16 NOTE — Progress Notes (Signed)
   Postoperative Follow-up Patient presents post op from laparoscopic bilateral salpingectomy 2 weeks ago for sterility.  Subjective: Eating a regular diet without difficulty. The patient is not having any pain.  Activity: normal activities of daily living.  She denies fever, chills, nausea and vomiting.   DIAGNOSIS:  A. FALLOPIAN TUBES, BILATERAL; LIGATION:  - COMPLETE CROSS-SECTIONS OF BILATERAL FALLOPIAN TUBES WITH NO  SIGNIFICANT PATHOLOGIC ALTERATION.  - NEGATIVE FOR MALIGNANCY.   Objective: Vital Signs: BP 102/70   Ht 5\' 8"  (1.727 m)   Wt 220 lb (99.8 kg)   BMI 33.45 kg/m  Physical Exam Constitutional:      General: She is not in acute distress.    Appearance: Normal appearance.  HENT:     Head: Normocephalic and atraumatic.  Eyes:     General: No scleral icterus.    Conjunctiva/sclera: Conjunctivae normal.  Abdominal:     General: Abdomen is flat. Bowel sounds are normal. There is no distension.     Palpations: Abdomen is soft. There is no mass.     Tenderness: There is no abdominal tenderness. There is no guarding or rebound.     Hernia: No hernia is present.     Comments: Incisions: without erythema, induration, warmth, and tenderness. It is clean, dry, and intact.    Neurological:     General: No focal deficit present.     Mental Status: She is alert and oriented to person, place, and time.     Cranial Nerves: No cranial nerve deficit.  Psychiatric:        Mood and Affect: Mood normal.        Behavior: Behavior normal.        Judgment: Judgment normal.      Assessment: 30 y.o. s/p above surgery progressing well  Plan: Patient has done well after surgery with no apparent complications.  I have discussed the post-operative course to date, and the expected progress moving forward.  The patient understands what complications to be concerned about.  I will see the patient in routine follow up, or sooner if needed.    Activity plan: Increase activity  slowly. May return to work  Prentice Docker, MD  09/16/2020, 1:54 PM

## 2020-10-01 ENCOUNTER — Encounter: Payer: Self-pay | Admitting: Oncology

## 2022-08-30 ENCOUNTER — Encounter: Payer: Self-pay | Admitting: Oncology
